# Patient Record
Sex: Male | Born: 1965 | Race: Black or African American | Hispanic: No | Marital: Single | Smoking: Current some day smoker
Health system: Southern US, Community
[De-identification: ages and names within clinical notes are randomized; demographics above are authoritative.]

## PROBLEM LIST (undated history)

## (undated) DIAGNOSIS — M5432 Sciatica, left side: Secondary | ICD-10-CM

## (undated) DIAGNOSIS — R4589 Other symptoms and signs involving emotional state: Secondary | ICD-10-CM

## (undated) DIAGNOSIS — R4689 Other symptoms and signs involving appearance and behavior: Secondary | ICD-10-CM

## (undated) DIAGNOSIS — F1010 Alcohol abuse, uncomplicated: Secondary | ICD-10-CM

## (undated) DIAGNOSIS — J45909 Unspecified asthma, uncomplicated: Secondary | ICD-10-CM

## (undated) DIAGNOSIS — G894 Chronic pain syndrome: Secondary | ICD-10-CM

## (undated) DIAGNOSIS — I10 Essential (primary) hypertension: Secondary | ICD-10-CM

## (undated) HISTORY — DX: Chronic pain syndrome: G89.4

## (undated) HISTORY — PX: SCIATIC NERVE EXPLORATION: SHX5373

## (undated) HISTORY — DX: Sciatica, left side: M54.32

---

## 1990-09-22 HISTORY — PX: OTHER SURGICAL HISTORY: SHX169

## 1998-05-22 ENCOUNTER — Emergency Department (HOSPITAL_COMMUNITY): Admission: EM | Admit: 1998-05-22 | Discharge: 1998-05-22 | Payer: Self-pay | Admitting: Emergency Medicine

## 1998-09-22 HISTORY — PX: OTHER SURGICAL HISTORY: SHX169

## 1999-07-18 ENCOUNTER — Emergency Department (HOSPITAL_COMMUNITY): Admission: EM | Admit: 1999-07-18 | Discharge: 1999-07-18 | Payer: Self-pay | Admitting: Emergency Medicine

## 2007-08-30 ENCOUNTER — Emergency Department (HOSPITAL_COMMUNITY): Admission: EM | Admit: 2007-08-30 | Discharge: 2007-08-30 | Payer: Self-pay | Admitting: Emergency Medicine

## 2008-05-17 ENCOUNTER — Encounter: Admission: RE | Admit: 2008-05-17 | Discharge: 2008-06-16 | Payer: Self-pay | Admitting: Family Medicine

## 2008-09-06 HISTORY — PX: OTHER SURGICAL HISTORY: SHX169

## 2009-02-01 ENCOUNTER — Emergency Department (HOSPITAL_COMMUNITY): Admission: EM | Admit: 2009-02-01 | Discharge: 2009-02-01 | Payer: Self-pay | Admitting: Emergency Medicine

## 2009-09-22 ENCOUNTER — Emergency Department (HOSPITAL_COMMUNITY): Admission: EM | Admit: 2009-09-22 | Discharge: 2009-09-22 | Payer: Self-pay | Admitting: Emergency Medicine

## 2010-08-21 ENCOUNTER — Emergency Department (HOSPITAL_COMMUNITY): Admission: EM | Admit: 2010-08-21 | Discharge: 2010-08-21 | Payer: Self-pay | Admitting: Emergency Medicine

## 2010-12-08 LAB — BASIC METABOLIC PANEL
BUN: 15 mg/dL (ref 6–23)
Chloride: 101 mEq/L (ref 96–112)
Creatinine, Ser: 1.25 mg/dL (ref 0.4–1.5)
GFR calc non Af Amer: >60 mL/min (ref >60)
Glucose, Bld: 92 mg/dL (ref 70–99)
Potassium: 3.7 mEq/L (ref 3.5–5.1)

## 2010-12-08 LAB — CBC
HCT: 44 % (ref 39.0–52.0)
MCV: 90.7 fL (ref 78.0–100.0)
Platelets: 189 10*3/uL (ref 150–400)
RDW: 14.3 % (ref 11.5–15.5)
WBC: 3.9 10*3/uL — ABNORMAL LOW (ref 4.0–10.5)

## 2010-12-08 LAB — DIFFERENTIAL
Basophils Absolute: 0 10*3/uL (ref 0.0–0.1)
Eosinophils Absolute: 0.1 10*3/uL (ref 0.0–0.7)
Eosinophils Relative: 2 % (ref 0–5)
Lymphs Abs: 0.7 10*3/uL (ref 0.7–4.0)
Neutrophils Relative %: 64 % (ref 43–77)

## 2012-01-29 ENCOUNTER — Ambulatory Visit (INDEPENDENT_AMBULATORY_CARE_PROVIDER_SITE_OTHER): Payer: Medicaid Other | Admitting: Family Medicine

## 2012-01-29 ENCOUNTER — Encounter: Payer: Self-pay | Admitting: Family Medicine

## 2012-01-29 VITALS — BP 137/105 | HR 87 | Ht 75.39 in | Wt 230.0 lb

## 2012-01-29 DIAGNOSIS — R05 Cough: Secondary | ICD-10-CM

## 2012-01-29 DIAGNOSIS — K219 Gastro-esophageal reflux disease without esophagitis: Secondary | ICD-10-CM

## 2012-01-29 DIAGNOSIS — G479 Sleep disorder, unspecified: Secondary | ICD-10-CM | POA: Insufficient documentation

## 2012-01-29 DIAGNOSIS — G8921 Chronic pain due to trauma: Secondary | ICD-10-CM

## 2012-01-29 DIAGNOSIS — B009 Herpesviral infection, unspecified: Secondary | ICD-10-CM

## 2012-01-29 DIAGNOSIS — G894 Chronic pain syndrome: Secondary | ICD-10-CM

## 2012-01-29 MED ORDER — ZOLPIDEM TARTRATE 10 MG PO TABS: 10.0000 mg | ORAL_TABLET | Freq: Every evening | ORAL | Status: DC | PRN

## 2012-01-29 MED ORDER — GABAPENTIN 100 MG PO CAPS: 100.0000 mg | ORAL_CAPSULE | Freq: Three times a day (TID) | ORAL | Status: DC

## 2012-01-29 MED ORDER — OMEPRAZOLE 20 MG PO CPDR: 20.0000 mg | DELAYED_RELEASE_CAPSULE | Freq: Every day | ORAL | Status: DC

## 2012-01-29 NOTE — Patient Instructions (Signed)
Dear [name],   Thank you for coming to clinic today. Please read below regarding the issues that we discussed.   1. Pain - We will start with neurontin 300 mg a day (3 pills). You can go up every couple days, but do not take more than 900 mg a day until you see me again.   2. Sleep - Let's try ambien since it has worked in the past.   3. Cough - I think this can be related to acid reflux. We will try medication called omeprazole to reduce the acid and cough.   Please follow up in clinic in 2 weeks . Please call earlier if you have any questions or concerns.   Sincerely,   Dr. Clinton Sawyer

## 2012-02-14 ENCOUNTER — Ambulatory Visit (INDEPENDENT_AMBULATORY_CARE_PROVIDER_SITE_OTHER): Payer: 59 | Admitting: Family Medicine

## 2012-02-14 VITALS — BP 164/109 | HR 72 | Temp 98.0°F | Resp 16 | Ht 70.0 in | Wt 229.6 lb

## 2012-02-14 DIAGNOSIS — R05 Cough: Secondary | ICD-10-CM

## 2012-02-14 DIAGNOSIS — G894 Chronic pain syndrome: Secondary | ICD-10-CM

## 2012-02-14 DIAGNOSIS — G8921 Chronic pain due to trauma: Secondary | ICD-10-CM

## 2012-02-14 DIAGNOSIS — M5432 Sciatica, left side: Secondary | ICD-10-CM

## 2012-02-14 DIAGNOSIS — I10 Essential (primary) hypertension: Secondary | ICD-10-CM

## 2012-02-14 DIAGNOSIS — R059 Cough, unspecified: Secondary | ICD-10-CM

## 2012-02-14 HISTORY — DX: Chronic pain syndrome: G89.4

## 2012-02-14 HISTORY — DX: Sciatica, left side: M54.32

## 2012-02-14 LAB — COMPREHENSIVE METABOLIC PANEL
ALT: 26 U/L (ref 0–53)
BUN: 15 mg/dL (ref 6–23)
CO2: 26 mEq/L (ref 19–32)
Calcium: 9.3 mg/dL (ref 8.4–10.5)
Chloride: 106 mEq/L (ref 96–112)
Creat: 0.92 mg/dL (ref 0.50–1.35)
Glucose, Bld: 92 mg/dL (ref 70–99)
Total Bilirubin: 0.7 mg/dL (ref 0.3–1.2)

## 2012-02-14 LAB — POCT CBC
HCT, POC: 42.5 % — AB (ref 43.5–53.7)
Hemoglobin: 14.2 g/dL (ref 14.1–18.1)
Lymph, poc: 1.6 (ref 0.6–3.4)
MCH, POC: 30.7 pg (ref 27–31.2)
MCHC: 33.4 g/dL (ref 31.8–35.4)
MCV: 91 fL (ref 80–97)
RBC: 4.63 M/uL — AB (ref 4.69–6.13)
WBC: 3.9 10*3/uL — AB (ref 4.6–10.2)

## 2012-02-14 LAB — GLUCOSE, POCT (MANUAL RESULT ENTRY): POC Glucose: 83 mg/dl (ref 70–99)

## 2012-02-14 MED ORDER — GABAPENTIN 100 MG PO CAPS: ORAL_CAPSULE | ORAL | Status: DC

## 2012-02-14 MED ORDER — BECLOMETHASONE DIPROPIONATE 40 MCG/ACT IN AERS: 2.0000 | INHALATION_SPRAY | Freq: Two times a day (BID) | RESPIRATORY_TRACT | Status: DC

## 2012-02-14 MED ORDER — PREDNISONE 20 MG PO TABS: ORAL_TABLET | ORAL | Status: AC

## 2012-02-14 MED ORDER — ALBUTEROL SULFATE (2.5 MG/3ML) 0.083% IN NEBU
2.5000 mg | INHALATION_SOLUTION | Freq: Once | RESPIRATORY_TRACT | Status: AC
  Administered 2012-02-14: 2.5 mg via RESPIRATORY_TRACT

## 2012-02-14 MED ORDER — LOSARTAN POTASSIUM-HCTZ 50-12.5 MG PO TABS: ORAL_TABLET | ORAL | Status: DC

## 2012-02-14 MED ORDER — IPRATROPIUM BROMIDE 0.02 % IN SOLN
0.5000 mg | Freq: Once | RESPIRATORY_TRACT | Status: AC
  Administered 2012-02-14: 0.5 mg via RESPIRATORY_TRACT

## 2012-02-14 MED ORDER — ALBUTEROL SULFATE HFA 108 (90 BASE) MCG/ACT IN AERS: 2.0000 | INHALATION_SPRAY | Freq: Four times a day (QID) | RESPIRATORY_TRACT | Status: DC | PRN

## 2012-02-14 NOTE — Patient Instructions (Signed)
Take albuterol inhaler 2 puffs every 4-6 hours for wheezing and shortness of breath.  Use only when necessary  Take QVAR 40  Two puffs twice daily on a long term basis to prevent asthma attacks  Increase Neurontin (gabepentin) to 300 mg twice daily for 1 week, then 3 times daily.
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
## 2012-02-16 ENCOUNTER — Encounter: Payer: Self-pay | Admitting: *Deleted

## 2012-02-19 ENCOUNTER — Encounter: Payer: Self-pay | Admitting: Family Medicine

## 2012-02-19 DIAGNOSIS — B009 Herpesviral infection, unspecified: Secondary | ICD-10-CM | POA: Insufficient documentation

## 2012-02-19 NOTE — Assessment & Plan Note (Signed)
I believe that this may be GERD based on hx so start trial of PPI.

## 2012-02-19 NOTE — Assessment & Plan Note (Signed)
Given Rx for [employer]

## 2012-02-19 NOTE — Assessment & Plan Note (Signed)
Since pain is neuropathic and has taken neurontin in the past, we will try this again; starting at 100 mg TID and titrating up; f/u 4 weeks
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
## 2013-01-28 ENCOUNTER — Emergency Department (HOSPITAL_COMMUNITY)
Admission: EM | Admit: 2013-01-28 | Discharge: 2013-01-28 | Disposition: A | Discharge status: Other Acute Inpatient Hospital | Payer: Medicare Other | Source: Home / Self Care | Attending: Emergency Medicine | Admitting: Emergency Medicine

## 2013-01-28 ENCOUNTER — Inpatient Hospital Stay (HOSPITAL_COMMUNITY)
Admission: AD | Admit: 2013-01-28 | Discharge: 2013-01-31 | DRG: 897 | Disposition: A | Discharge status: Home / Self Care | Payer: Medicare Other | Source: Intra-hospital | Attending: Psychiatry | Admitting: Psychiatry

## 2013-01-28 ENCOUNTER — Encounter (HOSPITAL_COMMUNITY): Payer: Self-pay

## 2013-01-28 ENCOUNTER — Encounter (HOSPITAL_COMMUNITY): Payer: Self-pay | Admitting: Emergency Medicine

## 2013-01-28 DIAGNOSIS — G894 Chronic pain syndrome: Secondary | ICD-10-CM | POA: Diagnosis present

## 2013-01-28 DIAGNOSIS — F1020 Alcohol dependence, uncomplicated: Principal | ICD-10-CM | POA: Diagnosis present

## 2013-01-28 DIAGNOSIS — F1010 Alcohol abuse, uncomplicated: Secondary | ICD-10-CM

## 2013-01-28 LAB — COMPREHENSIVE METABOLIC PANEL
ALT: 36 U/L (ref 0–53)
AST: 35 U/L (ref 0–37)
Alkaline Phosphatase: 57 U/L (ref 39–117)
CO2: 23 mEq/L (ref 19–32)
Calcium: 9.4 mg/dL (ref 8.4–10.5)
GFR calc non Af Amer: 77 mL/min — ABNORMAL LOW (ref >90)
Potassium: 3.8 mEq/L (ref 3.5–5.1)
Sodium: 140 mEq/L (ref 135–145)

## 2013-01-28 LAB — CBC
MCH: 30.9 pg (ref 26.0–34.0)
Platelets: 260 10*3/uL (ref 150–400)
RBC: 5.08 MIL/uL (ref 4.22–5.81)
WBC: 3.9 10*3/uL — ABNORMAL LOW (ref 4.0–10.5)

## 2013-01-28 LAB — ACETAMINOPHEN LEVEL: Acetaminophen (Tylenol), Serum: <15 ug/mL (ref 10–30)

## 2013-01-28 MED ORDER — THIAMINE HCL 100 MG/ML IJ SOLN: 100.0000 mg | Freq: Every day | INTRAMUSCULAR | Status: DC

## 2013-01-28 MED ORDER — IBUPROFEN 200 MG PO TABS: 600.0000 mg | ORAL_TABLET | Freq: Three times a day (TID) | ORAL | Status: DC | PRN

## 2013-01-28 MED ORDER — ALUM & MAG HYDROXIDE-SIMETH 200-200-20 MG/5ML PO SUSP: 30.0000 mL | ORAL | Status: DC | PRN

## 2013-01-28 MED ORDER — LORAZEPAM 1 MG PO TABS: 0.0000 mg | ORAL_TABLET | Freq: Two times a day (BID) | ORAL | Status: DC

## 2013-01-28 MED ORDER — ONDANSETRON HCL 4 MG PO TABS: 4.0000 mg | ORAL_TABLET | Freq: Three times a day (TID) | ORAL | Status: DC | PRN

## 2013-01-28 MED ORDER — VITAMIN B-1 100 MG PO TABS
100.0000 mg | ORAL_TABLET | Freq: Every day | ORAL | Status: DC
  Administered 2013-01-28: 100 mg via ORAL
  Filled 2013-01-28: qty 1

## 2013-01-28 MED ORDER — FOLIC ACID 1 MG PO TABS
1.0000 mg | ORAL_TABLET | Freq: Every day | ORAL | Status: DC
  Administered 2013-01-28: 1 mg via ORAL
  Filled 2013-01-28: qty 1

## 2013-01-28 MED ORDER — ZOLPIDEM TARTRATE 5 MG PO TABS: 5.0000 mg | ORAL_TABLET | Freq: Every evening | ORAL | Status: DC | PRN

## 2013-01-28 MED ORDER — LORAZEPAM 1 MG PO TABS: 1.0000 mg | ORAL_TABLET | Freq: Three times a day (TID) | ORAL | Status: DC | PRN

## 2013-01-28 MED ORDER — ADULT MULTIVITAMIN W/MINERALS CH
1.0000 | ORAL_TABLET | Freq: Every day | ORAL | Status: DC
  Administered 2013-01-28: 1 via ORAL
  Filled 2013-01-28: qty 1

## 2013-01-28 MED ORDER — LORAZEPAM 1 MG PO TABS: 0.0000 mg | ORAL_TABLET | Freq: Four times a day (QID) | ORAL | Status: DC

## 2013-01-28 MED ORDER — NICOTINE 21 MG/24HR TD PT24
21.0000 mg | MEDICATED_PATCH | Freq: Every day | TRANSDERMAL | Status: DC
  Administered 2013-01-28: 21 mg via TRANSDERMAL
  Filled 2013-01-28: qty 1

## 2013-01-28 NOTE — ED Notes (Signed)
Patient advised that "i have been drinking since I was 6 or 7.  I have to get detoxed".  When patient was asked why detoxing, he responded "loss of a relationship with my daughter".
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
## 2013-01-28 NOTE — ED Notes (Signed)
Pt here requesting detox from ETOH; pt admits to cocaine use also; pt sts drinks fifth plus a 6 pack a day and last drank today; pt denies SI/HI
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
## 2013-01-28 NOTE — ED Notes (Signed)
Patient given paper scrubs. Security called for wanding.

## 2013-01-29 ENCOUNTER — Encounter (HOSPITAL_COMMUNITY): Payer: Self-pay | Admitting: Psychiatry

## 2013-01-29 DIAGNOSIS — F1010 Alcohol abuse, uncomplicated: Secondary | ICD-10-CM

## 2013-01-29 DIAGNOSIS — F1994 Other psychoactive substance use, unspecified with psychoactive substance-induced mood disorder: Secondary | ICD-10-CM

## 2013-01-29 DIAGNOSIS — F1020 Alcohol dependence, uncomplicated: Secondary | ICD-10-CM | POA: Diagnosis present

## 2013-01-29 DIAGNOSIS — F1910 Other psychoactive substance abuse, uncomplicated: Secondary | ICD-10-CM

## 2013-01-29 MED ORDER — THIAMINE HCL 100 MG/ML IJ SOLN: 100.0000 mg | Freq: Once | INTRAMUSCULAR | Status: DC

## 2013-01-29 MED ORDER — ADULT MULTIVITAMIN W/MINERALS CH
1.0000 | ORAL_TABLET | Freq: Every day | ORAL | Status: DC
  Administered 2013-01-29 – 2013-01-31 (×3): 1 via ORAL
  Filled 2013-01-29 (×5): qty 1

## 2013-01-29 MED ORDER — CHLORDIAZEPOXIDE HCL 25 MG PO CAPS: 25.0000 mg | ORAL_CAPSULE | Freq: Every day | ORAL | Status: DC

## 2013-01-29 MED ORDER — METHOCARBAMOL 500 MG PO TABS
500.0000 mg | ORAL_TABLET | Freq: Three times a day (TID) | ORAL | Status: DC | PRN
  Administered 2013-01-29 – 2013-01-30 (×3): 500 mg via ORAL
  Filled 2013-01-29 (×3): qty 1

## 2013-01-29 MED ORDER — CHLORDIAZEPOXIDE HCL 25 MG PO CAPS
25.0000 mg | ORAL_CAPSULE | ORAL | Status: DC
  Administered 2013-01-31: 25 mg via ORAL
  Filled 2013-01-29: qty 1

## 2013-01-29 MED ORDER — CHLORDIAZEPOXIDE HCL 25 MG PO CAPS
25.0000 mg | ORAL_CAPSULE | Freq: Three times a day (TID) | ORAL | Status: AC
  Administered 2013-01-30 (×3): 25 mg via ORAL
  Filled 2013-01-29 (×3): qty 1

## 2013-01-29 MED ORDER — ONDANSETRON 4 MG PO TBDP: 4.0000 mg | ORAL_TABLET | Freq: Four times a day (QID) | ORAL | Status: DC | PRN

## 2013-01-29 MED ORDER — NABUMETONE 500 MG PO TABS
500.0000 mg | ORAL_TABLET | Freq: Two times a day (BID) | ORAL | Status: DC | PRN
  Administered 2013-01-29 – 2013-01-30 (×4): 500 mg via ORAL
  Filled 2013-01-29 (×4): qty 1

## 2013-01-29 MED ORDER — VITAMIN B-1 100 MG PO TABS
100.0000 mg | ORAL_TABLET | Freq: Every day | ORAL | Status: DC
  Administered 2013-01-29 – 2013-01-31 (×3): 100 mg via ORAL
  Filled 2013-01-29 (×4): qty 1

## 2013-01-29 MED ORDER — CHLORDIAZEPOXIDE HCL 25 MG PO CAPS
25.0000 mg | ORAL_CAPSULE | Freq: Once | ORAL | Status: AC
  Administered 2013-01-29: 25 mg via ORAL
  Filled 2013-01-29: qty 1

## 2013-01-29 MED ORDER — LOPERAMIDE HCL 2 MG PO CAPS: 2.0000 mg | ORAL_CAPSULE | ORAL | Status: DC | PRN

## 2013-01-29 MED ORDER — NICOTINE 21 MG/24HR TD PT24
21.0000 mg | MEDICATED_PATCH | Freq: Every day | TRANSDERMAL | Status: DC
  Administered 2013-01-29 – 2013-01-31 (×3): 21 mg via TRANSDERMAL
  Filled 2013-01-29: qty 14
  Filled 2013-01-29 (×4): qty 1

## 2013-01-29 MED ORDER — CHLORDIAZEPOXIDE HCL 25 MG PO CAPS
25.0000 mg | ORAL_CAPSULE | Freq: Four times a day (QID) | ORAL | Status: AC
  Administered 2013-01-29 (×4): 25 mg via ORAL
  Filled 2013-01-29 (×4): qty 1

## 2013-01-29 MED ORDER — TRAZODONE HCL 50 MG PO TABS
50.0000 mg | ORAL_TABLET | Freq: Once | ORAL | Status: AC
  Administered 2013-01-29: 50 mg via ORAL
  Filled 2013-01-29 (×2): qty 1

## 2013-01-29 MED ORDER — HYDROXYZINE HCL 25 MG PO TABS
25.0000 mg | ORAL_TABLET | Freq: Four times a day (QID) | ORAL | Status: DC | PRN
  Administered 2013-01-29 – 2013-01-30 (×2): 25 mg via ORAL
  Filled 2013-01-29 (×2): qty 1

## 2013-01-29 MED ORDER — CHLORDIAZEPOXIDE HCL 25 MG PO CAPS: 25.0000 mg | ORAL_CAPSULE | Freq: Four times a day (QID) | ORAL | Status: DC | PRN

## 2013-01-29 NOTE — ED Provider Notes (Signed)
Medical screening examination/treatment/procedure(s) were performed by non-physician practitioner and as supervising physician I was immediately available for consultation/collaboration.   Helaine Yackel III, MD 01/29/13 1301 

## 2013-01-29 NOTE — [facility] Suicide Risk Assessment (Signed)
Suicide Risk Assessment  Admission Assessment     Information obtained from: Patient Demographic factors:   Current Mental Status per Physician Patient denies suicidal or homicidal ideation, hallucinations, illusions, or delusions. Patient engages with good eye contact, is able to focus adequately in a one to one setting, and has clear goal directed thoughts. Patient speaks with a natural conversational volume, rate, and tone. Anxiety was reported at 0 on a scale of 1 the least and 10 the most. Depression was reported at 0 on the same scale. Patient is oriented times 4, recent and remote memory intact. Judgement: limited by his mental illness and his addictive thinking. Insight: limited by his mental illness and his addictive thinking.  Loss Factors: Decline in physical health Historical Factors: NA Risk Reduction Factors: Responsible for children under 64 years of age;Sense of responsibility to family;Positive social support;Positive therapeutic relationship  CLINICAL FACTORS:   Alcohol/Substance Abuse/Dependencies  COGNITIVE FEATURES THAT CONTRIBUTE TO RISK:  Closed-mindedness Thought constriction (tunnel vision)    SUICIDE RISK:   Minimal: No identifiable suicidal ideation.  Patients presenting with no risk factors but with morbid ruminations; may be classified as minimal risk based on the severity of the depressive symptoms  PLAN OF CARE: 1 Admit for crisis management and stabilization.  Estimate length of stay is 3 to 5 days.  Get started in 12 Step programing.  2. Medication management to reduce current symptoms to base line and improve the patient's overall level of functioning.  3. Treat health problems as indicated:  4. Develop treatment plan to decrease risk of relapse upon discharge and the need for readmission.  5. Psycho-social education regarding relapse prevention and self-care.  6. Review and reinstate any pertinent home medication for other health issues  where appropriate.  7. Call for Consult with [facility] for additional specialty patient services as needed.  I certify that inpatient services furnished can reasonably be expected to improve the patient's condition.  Orson Aloe, MD, [facility] 01/29/2013 2:45 PM

## 2013-01-29 NOTE — Progress Notes (Signed)
Adult Psychoeducational Group Note  Date:  01/29/2013 Time:  0900  Group Topic/Focus:  Recovery Goals:   The focus of this group is to identify appropriate goals for recovery and establish a plan to achieve them.  Participation Level:  Active  Participation Quality:  Appropriate and Attentive  Affect:  Appropriate and Flat  Cognitive:  Alert, Appropriate and Oriented  Insight: Improving  Engagement in Group:  Engaged  Modes of Intervention:  Discussion, Education and Problem-solving  Additional Comments:  Pt is attentive to group discussion and expressed his needs to staff.  Lorin Hauck Shari Prows 01/29/2013, 10:26 AM
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
## 2013-01-29 NOTE — Tx Team (Signed)
Initial Interdisciplinary Treatment Plan  PATIENT STRENGTHS: (choose at least two) Ability for insight Average or above average intelligence Motivation for treatment/growth Supportive family/friends  PATIENT STRESSORS: Health problems Legal issue Substance abuse   PROBLEM LIST: Problem List/Patient Goals Date to be addressed Date deferred Reason deferred Estimated date of resolution  ETOH detox 01/29/2013                                                      DISCHARGE CRITERIA:  Ability to meet basic life and health needs Adequate post-discharge living arrangements Motivation to continue treatment in a less acute level of care Verbal commitment to aftercare and medication compliance Withdrawal symptoms are absent or subacute and managed without 24-hour nursing intervention  PRELIMINARY DISCHARGE PLAN: Attend PHP/IOP Outpatient therapy Return to previous living arrangement  PATIENT/FAMIILY INVOLVEMENT: This treatment plan has been presented to and reviewed with the patient, [name], and/or family member.  The patient and family have been given the opportunity to ask questions and make suggestions.  JEHU-APPIAH, Imojean Yoshino K 01/29/2013, 12:17 AM

## 2013-01-29 NOTE — H&P (Signed)
,  Medical/psychiatric examination/treatment/procedure(s) were performed by non-physician practitioner and as supervising physician I was immediately available for consultation/collaboration. I agree with this note. I certify that inpatient services furnished can reasonably be expected to improve the patient's condition. Orson Aloe, MD, [facility]

## 2013-01-29 NOTE — Progress Notes (Signed)
D   Pt has been appropriate and pleasant he attended and participated in groups   He said his sleep and appetite was good even though he used a sleep medication    He reports normal energy level and virtually no signs and symptoms of withdrawal   He has good insight into his addiction and ways to prevent relapse A   Verbal support given  Medications administered and effectiveness monitored   Q 15 min checks R   Pt safe at present

## 2013-01-29 NOTE — Progress Notes (Signed)
Psychoeducational Group Note  Date:  01/29/2013 Time:  0945 am  Group Topic/Focus:  Identifying Needs:   The focus of this group is to help patients identify their personal needs that have been historically problematic and identify healthy behaviors to address their needs.  Participation Level:  Active  Participation Quality:  Appropriate, Attentive, Sharing and Supportive  Affect:  Appropriate  Cognitive:  Alert, Appropriate and Oriented  Insight:  Developing/Improving  Engagement in Group:  Developing/Improving  Additional Comments:    Andrena Mews 01/29/2013,3:12 PM
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
## 2013-01-29 NOTE — [facility] Group Notes (Signed)
[facility] Group Notes:  (Clinical Social Work)  01/29/2013     10-11AM  Summary of Progress/Problems:   The main focus of today's process group was for the patient to identify ways in which they have in the past sabotaged their own recovery. Motivational Interviewing was utilized to ask the group members what they get out of their substance use, and what reasons they may have for wanting to change.  Scale 1-10 (low to high) was used for patient to determine their current level of motivation.  The patient expressed that he was clean for 7 years on his own, then he moved to [place] and his father died.  He has self-sabotaged with alcohol, says alcohol is a trigger for some people to do drugs but for him it is a trigger to do more alcohol.  He will drink until he passes out.  Over the last month prior to admission he mostly stayed gone from home, on the street drinking, eating only rarely.  He called his behavior/thinking "retarded" and CSW asked him if it was more along the lines of "challenged" to which he agreed.  He wants to change because he is tired of disappointing people and himself, saying "I'm not dependable."  His motivation to change is 10 out of 10.  Type of Therapy:  Group Therapy - Process   Participation Level:  Active  Participation Quality:  Attentive and Sharing  Affect:  Blunted  Cognitive:  Oriented  Insight:  Engaged  Engagement in Therapy:  Engaged  Modes of Intervention:  Education, Teacher, English as a foreign language, Motivational Interviewing  Ambrose Mantle, LCSW 01/29/2013, 11:15 AM

## 2013-01-30 NOTE — Progress Notes (Signed)
[facility] Group Notes:  (Nursing/MHT/Case Management/Adjunct)  Date: 01/29/2013 Time: 2100  Type of Therapy:  wrap up group  Participation Level:  Active  Participation Quality:  Appropriate and Attentive  Affect:  Appropriate  Cognitive:  Alert and Appropriate  Insight:  Appropriate and Good  Engagement in Group:  Engaged  Modes of Intervention:  Clarification, Education and Support  Summary of Progress/Problems: Wants to thrive without alcohol.  Shelah Lewandowsky 01/30/2013, 1:01 AM
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
## 2013-01-30 NOTE — Progress Notes (Signed)
[facility] Group Notes:  (Nursing/MHT/Case Management/Adjunct)  Date:  01/30/2013  Time:  2100  Type of Therapy:  wrap up group  Participation Level:  Active  Participation Quality:  Appropriate, Attentive, Sharing and Supportive  Affect:  Appropriate  Cognitive:  Alert and Appropriate  Insight:  Good  Engagement in Group:  Engaged and Supportive  Modes of Intervention:  Clarification, Education and Support  Summary of Progress/Problems:  [name] 01/30/2013, 11:42 PM
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
## 2013-01-30 NOTE — Progress Notes (Signed)
Psychoeducational Group Note  Date:  01/30/2013 Time:  0945 am  Group Topic/Focus:  Making Healthy Choices:   The focus of this group is to help patients identify negative/unhealthy choices they were using prior to admission and identify positive/healthier coping strategies to replace them upon discharge.  Participation Level:  Active  Participation Quality:  Appropriate, Sharing and Supportive  Affect:  Appropriate  Cognitive:  Appropriate  Insight:  Developing/Improving  Engagement in Group:  Developing/Improving  Additional Comments:    Andrena Mews 01/30/2013, 10:29 AM

## 2013-01-30 NOTE — Progress Notes (Signed)
D   Pt has been appropriate and pleasant he attended and participated in groups   He said his sleep and appetite was good even though he used a sleep medication    He reports normal energy level and virtually no signs and symptoms of withdrawal   He has good insight into his addiction and ways to prevent relapse A   Verbal support given  Medications administered and effectiveness monitored   Q 15 min checks R   Pt safe at present 
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
## 2013-01-31 DIAGNOSIS — F1020 Alcohol dependence, uncomplicated: Principal | ICD-10-CM

## 2013-01-31 MED ORDER — NICOTINE 21 MG/24HR TD PT24: 1.0000 | MEDICATED_PATCH | Freq: Every day | TRANSDERMAL | Status: DC

## 2013-01-31 NOTE — Progress Notes (Signed)
NUTRITION ASSESSMENT  Pt identified as at risk on the Malnutrition Screen Tool  INTERVENTION: 1. Educated patient on the importance of nutrition and encouraged intake of food and beverages. 2. Discussed weight goals.   NUTRITION DIAGNOSIS: Unintentional weight loss related to sub-optimal intake as evidenced by pt report.   Goal: Pt to meet >/= 90% of their estimated nutrition needs.  Monitor:  PO intake  Assessment:   Patient reports UBW of 240-245 lbs.  Decreased to 229 lbs and new he needed to get help.  Drinking binge since Friday and was not eating.  Very good intake currently and usually PTA.  47 y.o. male  Height: Ht Readings from Last 1 Encounters:  01/29/13 6\' 3"  (1.905 m)    Weight: Wt Readings from Last 1 Encounters:  01/29/13 229 lb (103.874 kg)    Weight Hx: Wt Readings from Last 10 Encounters:  01/29/13 229 lb (103.874 kg)  02/14/12 229 lb 9.6 oz (104.146 kg)  01/29/12 230 lb (104.327 kg)    BMI:  Body mass index is 28.62 kg/(m^2). Pt meets criteria for overweight based on current BMI.  Estimated Nutritional Needs: Kcal: 25-30 kcal/kg Protein: > 1 gram protein/kg Fluid: 1 ml/kcal  Diet Order:   Pt is also offered choice of unit snacks mid-morning and mid-afternoon.  Pt is eating as desired.   Lab results and medications reviewed.   Oran Rein, RD, LDN Clinical Inpatient Dietitian Pager:  [phone] Weekend and after hours pager:  [phone]
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
## 2013-01-31 NOTE — Progress Notes (Signed)
[facility] Adult Case Management Discharge Plan :  Will you be returning to the same living situation after discharge: Yes,  returning home until treatment At discharge, do you have transportation home?:Yes,  access to transportation  Do you have the ability to pay for your medications:Yes,  access to meds  Release of information consent forms completed and in the chart;  Patient's signature needed at discharge.  Patient to Follow up at: Follow-up Information   Follow up with [facility]  On 02/16/2013. (Admission date for treatment on this date, per Tianna.  Arrive promptly at 8:00 am)    Contact information:   [address] [place], [place] [address] Phone: [phone] Fax: [phone]       Patient denies SI/HI:   Yes,  denies SI/HI    Safety Planning and Suicide Prevention discussed:  Yes,  discussed with pt (see suicide prevention note)  Horton, Salome Arnt 01/31/2013, 3:12 PM

## 2013-01-31 NOTE — Progress Notes (Signed)
I agree with this note. I certify that inpatient services furnished can reasonably be expected to improve the patient's condition. Kumiko Fishman, MD, MSPH  

## 2013-01-31 NOTE — Progress Notes (Signed)
Patient ID: [name], male   DOB: [birth year], 47 y.o.   MRN: [id number] D)  Has been pleasant and cooperative this evening, attended group, watched tv earlier, states has had a good day overall.  Came to med window for hs meds, and was going to go to bed.  Denies thoughts of self harm, feels detox is going OK, no c/o tonight of w/d. A)  Will continue to monitor for safety, continue POC. R)  safety maintained.

## 2013-01-31 NOTE — Tx Team (Signed)
Interdisciplinary Treatment Plan Update   Date Reviewed: 01/31/2013  Time Reviewed: 9:45 AM  Progress in Treatment:  Attending groups: Yes  Participating in groups: Yes Taking medication as prescribed: Yes  Tolerating medication: Yes  Family/Significant other contact made: Pt refused Patient understands diagnosis: Yes Discussing patient identified problems/goals with staff: Yes  Medical problems stabilized or resolved: Yes  Denies suicidal/homicidal ideation: Yes Patient has not harmed self or others: Yes    New Problems/Goals identified:None currently   Discharge Plan or Barriers:  Aftercare in place.  Additional Comments: Stable to d/c  Reasons for Continued Hospitalization: N/A, stable to d/c  Estimated Length of Stay: D/C today  For review of initial/current patient goals, please see plan of care.  Attendees: Patient:   5/12/20141:40 PM  Family:   5/12/20141:40 PM  Physician: Dr. Dub Mikes   5/12/20141:40 PM  Nursing:  Liborio Nixon, RN 5/12/20141:40 PM  Clinical Social Worker:  Reyes Ivan, [place] 5/12/20141:40 PM  Other: Robbie Louis, RN 5/12/20141:40 PM  Other:   5/12/20141:40 PM  Other:   5/12/20141:40 PM  Other:   5/12/20141:40 PM  Other:  5/12/20141:40 PM  Other:  5/12/20141:40 PM  Other:  5/12/20141:40 PM  Other:  5/12/20141:40 PM  Other:  5/12/20141:40 PM  Other:  5/12/20141:40 PM  Other:   5/12/20141:40 PM   Scribe for Treatment Team:   Carmina Miller, 01/31/2013, 1:40 PM

## 2013-01-31 NOTE — Progress Notes (Signed)
Adult Psychoeducational Group Note  Date:  01/31/2013 Time:  11:41 AM  Group Topic/Focus:  Self Care:   The focus of this group is to help patients understand the importance of self-care in order to improve or restore emotional, physical, spiritual, interpersonal, and financial health.  Participation Level:  Active  Participation Quality:  Appropriate and Attentive  Affect:  Appropriate  Cognitive:  Alert and Appropriate  Insight: Appropriate and Good  Engagement in Group:  Engaged  Modes of Intervention:  Activity and Discussion  Additional Comments:  Pt. attended group about physical, psychological, emotional, spiritual and relationship self care. Pt. identified areas of strength as well as ways to improve areas of growth.   Ruta Hinds [facility] 01/31/2013, 11:41 AM
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
## 2013-01-31 NOTE — B[facility] Group Notes (Signed)
[facility] LCSW Aftercare Discharge Planning Group Note   01/31/2013 8:45 AM  Participation Quality:  Alert and Appropriate   Mood/Affect:  Appropriate  Depression Rating:  3  Anxiety Rating:  3  Thoughts of Suicide: Pt denies SI/HI   Will you contract for safety?   NA  Current AVH:  No  Plan for Discharge/Comments:  Pt attended discharge planning group and actively participated in group.  CSW provided pt with today's workbook.  Pt states that he has a court date this morning and needs a letter faxed to his attorney this morning.  CSW faxed letter this morning.  Pt states that he wants to go to [facility] in [place] and is working on getting in there himself.  CSW provided pt with the number to [facility] and will monitor this referral.  No further needs voiced by pt at this time.    Transportation Means: Pt reports access to transportation  Supports: None reported.  Reyes Ivan, LCSWA 01/31/2013  9:20 AM

## 2013-01-31 NOTE — [facility] Suicide Risk Assessment (Signed)
[facility] Adult Inpatient Family/Significant Other Suicide Prevention Education  Suicide Prevention Education:   Patient Refusal for Family/Significant Other Suicide Prevention Education: The patient has refused to provide written consent for family/significant other to be provided Family/Significant Other Suicide Prevention Education during admission and/or prior to discharge.  Physician notified.  CSW provided suicide prevention information with patient.    The suicide prevention education provided includes the following:  Suicide risk factors  Suicide prevention and interventions  National Suicide Hotline telephone number  [facility] assessment telephone number  [facility] Emergency Assistance 911  [facility] and/or Residential Mobile Crisis Unit telephone number   Reyes Ivan, [place] 01/31/2013 12:09 PM

## 2013-01-31 NOTE — Progress Notes (Signed)
Pt was discharged home today.  He denied any S/I H/I or A/V hallucinations.    He was given f/u appointment, rx, sample medications, hotline info booklet, bus pass.  He voiced understanding to all instructions provided.  He wanted information on smoking cessation which was provided at discharge along with 2 week supply and a rx. He voiced understanding to all instructions provided.

## 2013-02-02 NOTE — Progress Notes (Signed)
Patient Discharge Instructions:  After Visit Summary (AVS):   Faxed to:  02/02/13 Discharge Summary Note:   Faxed to:  02/02/13 Psychiatric Admission Assessment Note:   Faxed to:  02/02/13 Suicide Risk Assessment - Discharge Assessment:   Faxed to:  02/02/13 Faxed/Sent to the Next Level Care provider:  02/02/13 Faxed to [facility] @ [phone]  Jerelene Redden, 02/02/2013, 4:14 PM

## 2013-02-16 ENCOUNTER — Emergency Department (HOSPITAL_BASED_OUTPATIENT_CLINIC_OR_DEPARTMENT_OTHER): Payer: Medicare Other

## 2013-02-16 ENCOUNTER — Encounter (HOSPITAL_BASED_OUTPATIENT_CLINIC_OR_DEPARTMENT_OTHER): Payer: Self-pay | Admitting: *Deleted

## 2013-02-16 ENCOUNTER — Emergency Department (HOSPITAL_BASED_OUTPATIENT_CLINIC_OR_DEPARTMENT_OTHER)
Admission: EM | Admit: 2013-02-16 | Discharge: 2013-02-16 | Disposition: A | Discharge status: Home / Self Care | Payer: Medicare Other | Attending: Emergency Medicine | Admitting: Emergency Medicine

## 2013-02-16 DIAGNOSIS — R0789 Other chest pain: Secondary | ICD-10-CM | POA: Insufficient documentation

## 2013-02-16 DIAGNOSIS — J209 Acute bronchitis, unspecified: Secondary | ICD-10-CM | POA: Insufficient documentation

## 2013-02-16 DIAGNOSIS — R05 Cough: Secondary | ICD-10-CM | POA: Insufficient documentation

## 2013-02-16 DIAGNOSIS — F17200 Nicotine dependence, unspecified, uncomplicated: Secondary | ICD-10-CM | POA: Insufficient documentation

## 2013-02-16 DIAGNOSIS — Z8739 Personal history of other diseases of the musculoskeletal system and connective tissue: Secondary | ICD-10-CM | POA: Insufficient documentation

## 2013-02-16 DIAGNOSIS — R059 Cough, unspecified: Secondary | ICD-10-CM | POA: Insufficient documentation

## 2013-02-16 DIAGNOSIS — J40 Bronchitis, not specified as acute or chronic: Secondary | ICD-10-CM

## 2013-02-16 DIAGNOSIS — R51 Headache: Secondary | ICD-10-CM | POA: Insufficient documentation

## 2013-02-16 DIAGNOSIS — G894 Chronic pain syndrome: Secondary | ICD-10-CM | POA: Insufficient documentation

## 2013-02-16 MED ORDER — ALBUTEROL SULFATE HFA 108 (90 BASE) MCG/ACT IN AERS: 2.0000 | INHALATION_SPRAY | RESPIRATORY_TRACT | Status: DC | PRN

## 2013-02-16 MED ORDER — DIPHENHYDRAMINE HCL 25 MG PO TABS: 25.0000 mg | ORAL_TABLET | Freq: Four times a day (QID) | ORAL | Status: DC

## 2013-02-16 MED ORDER — DOXYCYCLINE HYCLATE 100 MG PO CAPS: 100.0000 mg | ORAL_CAPSULE | Freq: Two times a day (BID) | ORAL | Status: DC

## 2013-02-16 MED ORDER — PREDNISONE 50 MG PO TABS: ORAL_TABLET | ORAL | Status: DC

## 2013-02-16 MED ORDER — ALBUTEROL SULFATE HFA 108 (90 BASE) MCG/ACT IN AERS
2.0000 | INHALATION_SPRAY | RESPIRATORY_TRACT | Status: DC | PRN
  Administered 2013-02-16: 2 via RESPIRATORY_TRACT
  Filled 2013-02-16: qty 6.7

## 2013-02-16 NOTE — ED Notes (Signed)
Pt c/o cough and SOB x 1 month URi symptoms
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
## 2013-08-27 ENCOUNTER — Encounter (HOSPITAL_COMMUNITY): Payer: Self-pay | Admitting: Emergency Medicine

## 2013-08-27 ENCOUNTER — Emergency Department (HOSPITAL_COMMUNITY)
Admission: EM | Admit: 2013-08-27 | Discharge: 2013-08-28 | Disposition: A | Discharge status: Other Acute Inpatient Hospital | Payer: Medicare Other | Attending: Emergency Medicine | Admitting: Emergency Medicine

## 2013-08-27 DIAGNOSIS — F17200 Nicotine dependence, unspecified, uncomplicated: Secondary | ICD-10-CM | POA: Insufficient documentation

## 2013-08-27 DIAGNOSIS — R45851 Suicidal ideations: Secondary | ICD-10-CM | POA: Insufficient documentation

## 2013-08-27 DIAGNOSIS — F4321 Adjustment disorder with depressed mood: Secondary | ICD-10-CM | POA: Insufficient documentation

## 2013-08-27 DIAGNOSIS — F1020 Alcohol dependence, uncomplicated: Secondary | ICD-10-CM

## 2013-08-27 DIAGNOSIS — G894 Chronic pain syndrome: Secondary | ICD-10-CM | POA: Insufficient documentation

## 2013-08-27 DIAGNOSIS — Z79899 Other long term (current) drug therapy: Secondary | ICD-10-CM | POA: Insufficient documentation

## 2013-08-27 DIAGNOSIS — M5430 Sciatica, unspecified side: Secondary | ICD-10-CM | POA: Insufficient documentation

## 2013-08-27 DIAGNOSIS — Z87828 Personal history of other (healed) physical injury and trauma: Secondary | ICD-10-CM | POA: Insufficient documentation

## 2013-08-27 LAB — CBC
HCT: 45.7 % (ref 39.0–52.0)
MCH: 30.4 pg (ref 26.0–34.0)
MCV: 86.7 fL (ref 78.0–100.0)
Platelets: 283 10*3/uL (ref 150–400)
RDW: 13.2 % (ref 11.5–15.5)

## 2013-08-27 LAB — COMPREHENSIVE METABOLIC PANEL
AST: 26 U/L (ref 0–37)
Albumin: 4.3 g/dL (ref 3.5–5.2)
BUN: 14 mg/dL (ref 6–23)
Calcium: 9.3 mg/dL (ref 8.4–10.5)
Chloride: 101 mEq/L (ref 96–112)
Creatinine, Ser: 1.21 mg/dL (ref 0.50–1.35)
GFR calc non Af Amer: 69 mL/min — ABNORMAL LOW (ref >90)
Total Bilirubin: 0.6 mg/dL (ref 0.3–1.2)

## 2013-08-27 LAB — ETHANOL: Alcohol, Ethyl (B): 118 mg/dL — ABNORMAL HIGH (ref 0–11)

## 2013-08-27 LAB — SALICYLATE LEVEL: Salicylate Lvl: <2 mg/dL — ABNORMAL LOW (ref 2.8–20.0)

## 2013-08-27 MED ORDER — VITAMIN B-1 100 MG PO TABS
100.0000 mg | ORAL_TABLET | Freq: Every day | ORAL | Status: DC
  Administered 2013-08-27 – 2013-08-28 (×2): 100 mg via ORAL
  Filled 2013-08-27 (×2): qty 1

## 2013-08-27 MED ORDER — IBUPROFEN 200 MG PO TABS
600.0000 mg | ORAL_TABLET | Freq: Three times a day (TID) | ORAL | Status: DC | PRN
  Administered 2013-08-28: 600 mg via ORAL
  Filled 2013-08-27 (×4): qty 1

## 2013-08-27 MED ORDER — ACETAMINOPHEN 325 MG PO TABS: 650.0000 mg | ORAL_TABLET | ORAL | Status: DC | PRN

## 2013-08-27 MED ORDER — ONDANSETRON HCL 4 MG PO TABS: 4.0000 mg | ORAL_TABLET | Freq: Three times a day (TID) | ORAL | Status: DC | PRN

## 2013-08-27 MED ORDER — ZOLPIDEM TARTRATE 5 MG PO TABS: 5.0000 mg | ORAL_TABLET | Freq: Every evening | ORAL | Status: DC | PRN

## 2013-08-27 MED ORDER — LORAZEPAM 1 MG PO TABS: 1.0000 mg | ORAL_TABLET | Freq: Three times a day (TID) | ORAL | Status: DC | PRN

## 2013-08-27 MED ORDER — ALBUTEROL SULFATE HFA 108 (90 BASE) MCG/ACT IN AERS: 2.0000 | INHALATION_SPRAY | RESPIRATORY_TRACT | Status: DC | PRN

## 2013-08-27 MED ORDER — NICOTINE 21 MG/24HR TD PT24
21.0000 mg | MEDICATED_PATCH | Freq: Every day | TRANSDERMAL | Status: DC
  Administered 2013-08-27 – 2013-08-28 (×2): 21 mg via TRANSDERMAL
  Filled 2013-08-27 (×2): qty 1

## 2013-08-27 MED ORDER — LORAZEPAM 1 MG PO TABS: 0.0000 mg | ORAL_TABLET | Freq: Four times a day (QID) | ORAL | Status: DC

## 2013-08-27 MED ORDER — THIAMINE HCL 100 MG/ML IJ SOLN: 100.0000 mg | Freq: Every day | INTRAMUSCULAR | Status: DC

## 2013-08-27 MED ORDER — LORAZEPAM 1 MG PO TABS: 0.0000 mg | ORAL_TABLET | Freq: Two times a day (BID) | ORAL | Status: DC

## 2013-08-27 MED ORDER — ALUM & MAG HYDROXIDE-SIMETH 200-200-20 MG/5ML PO SUSP: 30.0000 mL | ORAL | Status: DC | PRN

## 2013-08-27 NOTE — ED Notes (Signed)
Sitter at bedside.
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
## 2013-08-27 NOTE — ED Notes (Signed)
To room from E45.   

## 2013-08-28 LAB — RAPID URINE DRUG SCREEN, HOSP PERFORMED
Amphetamines: NOT DETECTED
Benzodiazepines: NOT DETECTED
Opiates: NOT DETECTED

## 2013-08-28 MED ORDER — ADULT MULTIVITAMIN W/MINERALS CH
1.0000 | ORAL_TABLET | Freq: Every day | ORAL | Status: DC
  Administered 2013-08-28: 1 via ORAL
  Filled 2013-08-28: qty 1

## 2013-08-28 MED ORDER — FOLIC ACID 1 MG PO TABS
1.0000 mg | ORAL_TABLET | Freq: Every day | ORAL | Status: DC
  Administered 2013-08-28: 1 mg via ORAL
  Filled 2013-08-28: qty 1

## 2013-08-28 NOTE — ED Notes (Signed)
Patient is resting comfortably, with sitter at the bedside. 

## 2013-08-28 NOTE — ED Notes (Signed)
[facility] arrived for pick up.

## 2013-08-28 NOTE — ED Notes (Signed)
Patient is resting comfortably, sitter at the bedside. 

## 2013-08-28 NOTE — ED Notes (Signed)
Patient is resting comfortably, sitter at the beside.

## 2013-08-28 NOTE — ED Notes (Signed)
telepsych in progress 
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
## 2013-08-28 NOTE — ED Notes (Signed)
[facility] transport called to transport

## 2013-08-28 NOTE — Progress Notes (Signed)
The following facilities have been contacted regarding inpatient tx on pt's behalf:  [facility]- spoke with Pattricia Boss, asked that information be faxed for review [facility]- spoke with [facility], stated that they do have beds available and asked that information be faxed [facility]- spoke with Geri Seminole, asked that information be faxed and they would review and call back with disposition  Pt's referral has been faxed to all of the facilities named above.  Tomi Bamberger, MHT

## 2013-08-28 NOTE — Clinical Social Work Note (Signed)
Broadus John at [facility] called to inform CSW at this time that they would accept pt today.  CSW informed pt's nurse, Drinda Butts of this.  Drinda Butts stated that she would then call report and arrange transport.  Updated EDP Nanavati at 1422 that pt has been accepted to [facility] today, to Dr. Robet Leu.    Reyes Ivan, LCSW 08/28/2013  2:52 PM

## 2013-08-28 NOTE — ED Notes (Signed)
Patient breakfast trays being delivered, patient awake ready to eat.

## 2013-08-29 NOTE — ED Provider Notes (Signed)
Medical screening examination/treatment/procedure(s) were performed by non-physician practitioner and as supervising physician I was immediately available for consultation/collaboration.  EKG Interpretation   None         Shelda Jakes, MD 08/29/13 [phone]

## 2013-10-21 ENCOUNTER — Encounter: Payer: Self-pay | Admitting: Family Medicine

## 2013-10-21 ENCOUNTER — Other Ambulatory Visit: Payer: Self-pay | Admitting: Internal Medicine

## 2013-10-21 ENCOUNTER — Ambulatory Visit (INDEPENDENT_AMBULATORY_CARE_PROVIDER_SITE_OTHER): Payer: Medicare Other | Admitting: Family Medicine

## 2013-10-21 VITALS — BP 135/80 | HR 87 | Temp 98.1°F | Resp 18 | Ht 75.0 in | Wt 265.0 lb

## 2013-10-21 DIAGNOSIS — R635 Abnormal weight gain: Secondary | ICD-10-CM

## 2013-10-21 DIAGNOSIS — N529 Male erectile dysfunction, unspecified: Secondary | ICD-10-CM

## 2013-10-21 DIAGNOSIS — M549 Dorsalgia, unspecified: Secondary | ICD-10-CM

## 2013-10-21 DIAGNOSIS — Z23 Encounter for immunization: Secondary | ICD-10-CM

## 2013-10-21 DIAGNOSIS — F17200 Nicotine dependence, unspecified, uncomplicated: Secondary | ICD-10-CM

## 2013-10-21 DIAGNOSIS — F1020 Alcohol dependence, uncomplicated: Secondary | ICD-10-CM
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
## 2013-10-25 LAB — URINALYSIS W MICROSCOPIC + REFLEX CULTURE
BILIRUBIN URINE: NEGATIVE
Bacteria, UA: NONE SEEN
Casts: NONE SEEN
Crystals: NONE SEEN
Glucose, UA: NEGATIVE mg/dL
Hgb urine dipstick: NEGATIVE
Ketones, ur: NEGATIVE mg/dL
LEUKOCYTES UA: NEGATIVE
NITRITE: NEGATIVE
PROTEIN: NEGATIVE mg/dL
SPECIFIC GRAVITY, URINE: 1.029 (ref 1.005–1.030)
SQUAMOUS EPITHELIAL / LPF: NONE SEEN
UROBILINOGEN UA: 0.2 mg/dL (ref 0.0–1.0)
pH: 6 (ref 5.0–8.0)

## 2013-10-27 ENCOUNTER — Telehealth: Payer: Self-pay | Admitting: Internal Medicine

## 2013-10-27 ENCOUNTER — Other Ambulatory Visit: Payer: Self-pay | Admitting: Family Medicine

## 2013-10-27 NOTE — Telephone Encounter (Signed)
Left message for patient re: cancellation of lab appoint for 10/28/13.

## 2013-10-28 ENCOUNTER — Other Ambulatory Visit: Payer: Self-pay

## 2013-11-03 ENCOUNTER — Encounter: Payer: Self-pay | Admitting: Internal Medicine

## 2013-11-25 ENCOUNTER — Encounter (HOSPITAL_COMMUNITY): Payer: Self-pay | Admitting: Emergency Medicine

## 2013-11-25 ENCOUNTER — Emergency Department (HOSPITAL_COMMUNITY)
Admission: EM | Admit: 2013-11-25 | Discharge: 2013-11-25 | Disposition: A | Discharge status: Other Acute Inpatient Hospital | Payer: Medicare Other | Attending: Emergency Medicine | Admitting: Emergency Medicine

## 2013-11-25 DIAGNOSIS — F1010 Alcohol abuse, uncomplicated: Secondary | ICD-10-CM | POA: Insufficient documentation

## 2013-11-25 DIAGNOSIS — Z8739 Personal history of other diseases of the musculoskeletal system and connective tissue: Secondary | ICD-10-CM | POA: Insufficient documentation

## 2013-11-25 DIAGNOSIS — F3289 Other specified depressive episodes: Secondary | ICD-10-CM | POA: Insufficient documentation

## 2013-11-25 DIAGNOSIS — F1310 Sedative, hypnotic or anxiolytic abuse, uncomplicated: Secondary | ICD-10-CM | POA: Insufficient documentation

## 2013-11-25 DIAGNOSIS — F1910 Other psychoactive substance abuse, uncomplicated: Secondary | ICD-10-CM

## 2013-11-25 DIAGNOSIS — F1410 Cocaine abuse, uncomplicated: Secondary | ICD-10-CM | POA: Insufficient documentation

## 2013-11-25 DIAGNOSIS — Z87828 Personal history of other (healed) physical injury and trauma: Secondary | ICD-10-CM | POA: Insufficient documentation

## 2013-11-25 DIAGNOSIS — R45851 Suicidal ideations: Secondary | ICD-10-CM | POA: Insufficient documentation

## 2013-11-25 DIAGNOSIS — G894 Chronic pain syndrome: Secondary | ICD-10-CM | POA: Insufficient documentation

## 2013-11-25 DIAGNOSIS — F329 Major depressive disorder, single episode, unspecified: Secondary | ICD-10-CM | POA: Insufficient documentation

## 2013-11-25 HISTORY — DX: Other symptoms and signs involving emotional state: R45.89

## 2013-11-25 HISTORY — DX: Other symptoms and signs involving appearance and behavior: R46.89

## 2013-11-25 LAB — CBC WITH DIFFERENTIAL/PLATELET
Basophils Absolute: 0 10*3/uL (ref 0.0–0.1)
Basophils Relative: 0 % (ref 0–1)
EOS ABS: 0.2 10*3/uL (ref 0.0–0.7)
Eosinophils Relative: 3 % (ref 0–5)
HEMATOCRIT: 44.7 % (ref 39.0–52.0)
Hemoglobin: 16.1 g/dL (ref 13.0–17.0)
LYMPHS ABS: 3.2 10*3/uL (ref 0.7–4.0)
Lymphocytes Relative: 50 % — ABNORMAL HIGH (ref 12–46)
MCH: 31.1 pg (ref 26.0–34.0)
MCHC: 36 g/dL (ref 30.0–36.0)
MCV: 86.3 fL (ref 78.0–100.0)
MONO ABS: 0.6 10*3/uL (ref 0.1–1.0)
Monocytes Relative: 9 % (ref 3–12)
NEUTROS PCT: 38 % — AB (ref 43–77)
Neutro Abs: 2.4 10*3/uL (ref 1.7–7.7)
Platelets: 293 10*3/uL (ref 150–400)
RBC: 5.18 MIL/uL (ref 4.22–5.81)
RDW: 13.1 % (ref 11.5–15.5)
WBC: 6.3 10*3/uL (ref 4.0–10.5)

## 2013-11-25 LAB — COMPREHENSIVE METABOLIC PANEL
ALK PHOS: 68 U/L (ref 39–117)
ALT: 37 U/L (ref 0–53)
AST: 31 U/L (ref 0–37)
Albumin: 4.2 g/dL (ref 3.5–5.2)
BILIRUBIN TOTAL: 0.7 mg/dL (ref 0.3–1.2)
BUN: 13 mg/dL (ref 6–23)
CO2: 22 mEq/L (ref 19–32)
Calcium: 9.1 mg/dL (ref 8.4–10.5)
Chloride: 98 mEq/L (ref 96–112)
Creatinine, Ser: 1.22 mg/dL (ref 0.50–1.35)
GFR calc Af Amer: 79 mL/min — ABNORMAL LOW (ref >90)
GFR calc non Af Amer: 69 mL/min — ABNORMAL LOW (ref >90)
GLUCOSE: 116 mg/dL — AB (ref 70–99)
POTASSIUM: 3.6 meq/L — AB (ref 3.7–5.3)
Sodium: 139 mEq/L (ref 137–147)
Total Protein: 7.8 g/dL (ref 6.0–8.3)

## 2013-11-25 LAB — RAPID URINE DRUG SCREEN, HOSP PERFORMED
Amphetamines: NOT DETECTED
Barbiturates: NOT DETECTED
Benzodiazepines: NOT DETECTED
Cocaine: POSITIVE — AB
Opiates: NOT DETECTED
Tetrahydrocannabinol: NOT DETECTED

## 2013-11-25 LAB — ETHANOL: Alcohol, Ethyl (B): 40 mg/dL — ABNORMAL HIGH (ref 0–11)

## 2013-11-25 MED ORDER — POTASSIUM CHLORIDE CRYS ER 20 MEQ PO TBCR
40.0000 meq | EXTENDED_RELEASE_TABLET | Freq: Once | ORAL | Status: AC
  Administered 2013-11-25: 40 meq via ORAL
  Filled 2013-11-25: qty 2

## 2013-11-25 NOTE — ED Notes (Signed)
Telepsych set up at bedside. 

## 2013-11-25 NOTE — Progress Notes (Signed)
B.Zamier Eggebrecht, MHT received report from [facility] that patient has been accepted for treatment by Dr. Robet Leuhotakura and has bed assignment to [id number]. Writer informed Julieanne Cottonina, [facility] at [facility] that [facility] will be [facility] to [facility] and [facility]. Writer informed attending nurse Eve, RN in [facility]C who will arrange for transfer via [facility].
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
## 2013-11-25 NOTE — ED Notes (Signed)
[facility] [employer] [facility].

## 2013-11-25 NOTE — BH Assessment (Signed)
[facility].

## 2013-11-25 NOTE — ED Notes (Signed)
RN Eme gave report to [place], [facility]. Dr. Ethelda ChickJacubowitz informed pt ready for transfer.

## 2013-11-25 NOTE — ED Notes (Signed)
Pt reports he drank 4 four loco's tonight (alcoholic beverages) and snorted 1 gram of cocaine. Pt states he normally drinks more than 4 four loco's a day however tonight he did not have enough money for more, pt also reports that 1 gram of cocaine is not a lot for him, he usually does more.

## 2013-11-25 NOTE — ED Provider Notes (Signed)
7:15 PM patient is alert ambulatory not lightheaded on standing stable for transfer. Results for orders placed during the hospital encounter of 11/25/13  CBC WITH DIFFERENTIAL      Result Value Ref Range   WBC 6.3  4.0 - 10.5 K/uL   RBC 5.18  4.22 - 5.81 MIL/uL   Hemoglobin 16.1  13.0 - 17.0 g/dL   HCT 96.044.7  45.439.0 - 09.852.0 %   MCV 86.3  78.0 - 100.0 fL   MCH 31.1  26.0 - 34.0 pg   MCHC 36.0  30.0 - 36.0 g/dL   RDW 11.913.1  14.711.5 - 82.915.5 %   Platelets 293  150 - 400 K/uL   Neutrophils Relative % 38 (*) 43 - 77 %   Neutro Abs 2.4  1.7 - 7.7 K/uL   Lymphocytes Relative 50 (*) 12 - 46 %   Lymphs Abs 3.2  0.7 - 4.0 K/uL   Monocytes Relative 9  3 - 12 %   Monocytes Absolute 0.6  0.1 - 1.0 K/uL   Eosinophils Relative 3  0 - 5 %   Eosinophils Absolute 0.2  0.0 - 0.7 K/uL   Basophils Relative 0  0 - 1 %   Basophils Absolute 0.0  0.0 - 0.1 K/uL  COMPREHENSIVE METABOLIC PANEL      Result Value Ref Range   Sodium 139  137 - 147 mEq/L   Potassium 3.6 (*) 3.7 - 5.3 mEq/L   Chloride 98  96 - 112 mEq/L   CO2 22  19 - 32 mEq/L   Glucose, Bld 116 (*) 70 - 99 mg/dL   BUN 13  6 - 23 mg/dL   Creatinine, Ser 5.621.22  0.50 - 1.35 mg/dL   Calcium 9.1  8.4 - 13.010.5 mg/dL   Total Protein 7.8  6.0 - 8.3 g/dL   Albumin 4.2  3.5 - 5.2 g/dL   AST 31  0 - 37 U/L   ALT 37  0 - 53 U/L   Alkaline Phosphatase 68  39 - 117 U/L   Total Bilirubin 0.7  0.3 - 1.2 mg/dL   GFR calc non Af Amer 69 (*) >90 mL/min   GFR calc Af Amer 79 (*) >90 mL/min  URINE RAPID DRUG SCREEN (HOSP PERFORMED)      Result Value Ref Range   Opiates NONE DETECTED  NONE DETECTED   Cocaine POSITIVE (*) NONE DETECTED   Benzodiazepines NONE DETECTED  NONE DETECTED   Amphetamines NONE DETECTED  NONE DETECTED   Tetrahydrocannabinol NONE DETECTED  NONE DETECTED   Barbiturates NONE DETECTED  NONE DETECTED  ETHANOL      Result Value Ref Range   Alcohol, Ethyl (B) 40 (*) 0 - 11 mg/dL   No results found.   Doug SouSam Fotios Amos, MD 11/25/13 [id number]

## 2013-11-25 NOTE — ED Notes (Signed)
Detox from binge drinking and cocaine. Pt. Is si and hi. "I think something is mentally wrong with me."

## 2013-11-25 NOTE — ED Notes (Signed)
MD at bedside. 

## 2013-11-25 NOTE — Progress Notes (Signed)
[name], MHT completed placement search for patient by contacting the following facilities listed below;  [facility] and is currently under review [facility] accepted referral for review referral has been faxed [facility] no detox [facility] at capacity

## 2013-11-25 NOTE — ED Notes (Signed)
Meal tray ordered for patient.

## 2013-12-08 ENCOUNTER — Telehealth: Payer: Self-pay | Admitting: General Practice

## 2013-12-08 ENCOUNTER — Encounter: Payer: Self-pay | Admitting: Internal Medicine

## 2013-12-08 NOTE — Telephone Encounter (Signed)
Left message for patient to call to reschedule CPE.

## 2014-03-30 ENCOUNTER — Inpatient Hospital Stay (HOSPITAL_COMMUNITY)
Admission: AD | Admit: 2014-03-30 | Discharge: 2014-04-03 | DRG: 897 | Disposition: A | Discharge status: Home / Self Care | Payer: Medicare Other | Source: Intra-hospital | Attending: Psychiatry | Admitting: Psychiatry

## 2014-03-30 ENCOUNTER — Emergency Department (HOSPITAL_COMMUNITY)
Admission: EM | Admit: 2014-03-30 | Discharge: 2014-03-30 | Disposition: A | Discharge status: Other Acute Inpatient Hospital | Payer: Medicare Other | Source: Home / Self Care | Attending: Emergency Medicine | Admitting: Emergency Medicine

## 2014-03-30 ENCOUNTER — Encounter (HOSPITAL_COMMUNITY): Payer: Self-pay | Admitting: *Deleted

## 2014-03-30 ENCOUNTER — Encounter (HOSPITAL_COMMUNITY): Payer: Self-pay | Admitting: Emergency Medicine

## 2014-03-30 DIAGNOSIS — F332 Major depressive disorder, recurrent severe without psychotic features: Secondary | ICD-10-CM | POA: Diagnosis present

## 2014-03-30 DIAGNOSIS — G894 Chronic pain syndrome: Secondary | ICD-10-CM | POA: Diagnosis present

## 2014-03-30 DIAGNOSIS — F1020 Alcohol dependence, uncomplicated: Principal | ICD-10-CM | POA: Diagnosis present

## 2014-03-30 DIAGNOSIS — G4700 Insomnia, unspecified: Secondary | ICD-10-CM | POA: Diagnosis present

## 2014-03-30 DIAGNOSIS — Z5987 Material hardship due to limited financial resources, not elsewhere classified: Secondary | ICD-10-CM

## 2014-03-30 DIAGNOSIS — Z598 Other problems related to housing and economic circumstances: Secondary | ICD-10-CM

## 2014-03-30 DIAGNOSIS — M5430 Sciatica, unspecified side: Secondary | ICD-10-CM | POA: Diagnosis present

## 2014-03-30 DIAGNOSIS — F17200 Nicotine dependence, unspecified, uncomplicated: Secondary | ICD-10-CM | POA: Diagnosis present

## 2014-03-30 DIAGNOSIS — F1024 Alcohol dependence with alcohol-induced mood disorder: Secondary | ICD-10-CM

## 2014-03-30 DIAGNOSIS — F1410 Cocaine abuse, uncomplicated: Secondary | ICD-10-CM

## 2014-03-30 DIAGNOSIS — G479 Sleep disorder, unspecified: Secondary | ICD-10-CM

## 2014-03-30 DIAGNOSIS — F1029 Alcohol dependence with unspecified alcohol-induced disorder: Secondary | ICD-10-CM

## 2014-03-30 DIAGNOSIS — Z559 Problems related to education and literacy, unspecified: Secondary | ICD-10-CM | POA: Diagnosis not present

## 2014-03-30 LAB — URINALYSIS, ROUTINE W REFLEX MICROSCOPIC
BILIRUBIN URINE: NEGATIVE
Glucose, UA: NEGATIVE mg/dL
Hgb urine dipstick: NEGATIVE
Ketones, ur: NEGATIVE mg/dL
Leukocytes, UA: NEGATIVE
Nitrite: NEGATIVE
PROTEIN: NEGATIVE mg/dL
SPECIFIC GRAVITY, URINE: 1.014 (ref 1.005–1.030)
UROBILINOGEN UA: 1 mg/dL (ref 0.0–1.0)
pH: 5.5 (ref 5.0–8.0)

## 2014-03-30 LAB — CBC WITH DIFFERENTIAL/PLATELET
BASOS PCT: 1 % (ref 0–1)
Basophils Absolute: 0 10*3/uL (ref 0.0–0.1)
Eosinophils Absolute: 0.2 10*3/uL (ref 0.0–0.7)
Eosinophils Relative: 6 % — ABNORMAL HIGH (ref 0–5)
HCT: 44.9 % (ref 39.0–52.0)
HEMOGLOBIN: 15.5 g/dL (ref 13.0–17.0)
Lymphocytes Relative: 52 % — ABNORMAL HIGH (ref 12–46)
Lymphs Abs: 2.2 10*3/uL (ref 0.7–4.0)
MCH: 31.8 pg (ref 26.0–34.0)
MCHC: 34.5 g/dL (ref 30.0–36.0)
MCV: 92 fL (ref 78.0–100.0)
Monocytes Absolute: 0.4 10*3/uL (ref 0.1–1.0)
Monocytes Relative: 10 % (ref 3–12)
NEUTROS ABS: 1.3 10*3/uL — AB (ref 1.7–7.7)
NEUTROS PCT: 31 % — AB (ref 43–77)
PLATELETS: 176 10*3/uL (ref 150–400)
RBC: 4.88 MIL/uL (ref 4.22–5.81)
RDW: 14 % (ref 11.5–15.5)
WBC: 4.1 10*3/uL (ref 4.0–10.5)

## 2014-03-30 LAB — BASIC METABOLIC PANEL
ANION GAP: 16 — AB (ref 5–15)
BUN: 10 mg/dL (ref 6–23)
CHLORIDE: 103 meq/L (ref 96–112)
CO2: 22 mEq/L (ref 19–32)
Calcium: 8.4 mg/dL (ref 8.4–10.5)
Creatinine, Ser: 0.93 mg/dL (ref 0.50–1.35)
GFR calc non Af Amer: >90 mL/min (ref >90)
Glucose, Bld: 110 mg/dL — ABNORMAL HIGH (ref 70–99)
POTASSIUM: 3.7 meq/L (ref 3.7–5.3)
Sodium: 141 mEq/L (ref 137–147)

## 2014-03-30 LAB — RAPID URINE DRUG SCREEN, HOSP PERFORMED
AMPHETAMINES: NOT DETECTED
Barbiturates: NOT DETECTED
Benzodiazepines: NOT DETECTED
COCAINE: POSITIVE — AB
Opiates: NOT DETECTED
TETRAHYDROCANNABINOL: NOT DETECTED

## 2014-03-30 LAB — ETHANOL: ALCOHOL ETHYL (B): 154 mg/dL — AB (ref 0–11)

## 2014-03-30 MED ORDER — CHLORDIAZEPOXIDE HCL 25 MG PO CAPS
25.0000 mg | ORAL_CAPSULE | ORAL | Status: DC
  Filled 2014-03-30 (×2): qty 1

## 2014-03-30 MED ORDER — CHLORDIAZEPOXIDE HCL 25 MG PO CAPS
50.0000 mg | ORAL_CAPSULE | Freq: Once | ORAL | Status: AC
  Administered 2014-03-30: 50 mg via ORAL

## 2014-03-30 MED ORDER — VITAMIN B-1 100 MG PO TABS
100.0000 mg | ORAL_TABLET | Freq: Every day | ORAL | Status: DC
  Administered 2014-03-30: 100 mg via ORAL
  Filled 2014-03-30: qty 1

## 2014-03-30 MED ORDER — CHLORDIAZEPOXIDE HCL 25 MG PO CAPS
ORAL_CAPSULE | ORAL | Status: AC
  Filled 2014-03-30: qty 2

## 2014-03-30 MED ORDER — LORAZEPAM 1 MG PO TABS: 0.0000 mg | ORAL_TABLET | Freq: Two times a day (BID) | ORAL | Status: DC

## 2014-03-30 MED ORDER — LORAZEPAM 1 MG PO TABS: 0.0000 mg | ORAL_TABLET | Freq: Four times a day (QID) | ORAL | Status: DC

## 2014-03-30 MED ORDER — LOPERAMIDE HCL 2 MG PO CAPS: 2.0000 mg | ORAL_CAPSULE | ORAL | Status: AC | PRN

## 2014-03-30 MED ORDER — ADULT MULTIVITAMIN W/MINERALS CH
1.0000 | ORAL_TABLET | Freq: Every day | ORAL | Status: DC
Start: 2014-03-30 — End: 2014-04-03
  Administered 2014-03-30 – 2014-04-03 (×5): 1 via ORAL
  Filled 2014-03-30 (×7): qty 1

## 2014-03-30 MED ORDER — ALUM & MAG HYDROXIDE-SIMETH 200-200-20 MG/5ML PO SUSP
30.0000 mL | ORAL | Status: DC | PRN
  Administered 2014-04-02 – 2014-04-03 (×2): 30 mL via ORAL

## 2014-03-30 MED ORDER — HYDROXYZINE HCL 25 MG PO TABS
25.0000 mg | ORAL_TABLET | Freq: Four times a day (QID) | ORAL | Status: AC | PRN
Start: 2014-03-30 — End: 2014-04-02

## 2014-03-30 MED ORDER — CHLORDIAZEPOXIDE HCL 25 MG PO CAPS
25.0000 mg | ORAL_CAPSULE | Freq: Four times a day (QID) | ORAL | Status: AC
  Administered 2014-03-30 – 2014-04-01 (×6): 25 mg via ORAL
  Filled 2014-03-30 (×6): qty 1

## 2014-03-30 MED ORDER — ACETAMINOPHEN 325 MG PO TABS: 650.0000 mg | ORAL_TABLET | Freq: Four times a day (QID) | ORAL | Status: DC | PRN

## 2014-03-30 MED ORDER — MAGNESIUM HYDROXIDE 400 MG/5ML PO SUSP: 30.0000 mL | Freq: Every day | ORAL | Status: DC | PRN

## 2014-03-30 MED ORDER — THIAMINE HCL 100 MG/ML IJ SOLN: 100.0000 mg | Freq: Every day | INTRAMUSCULAR | Status: DC

## 2014-03-30 MED ORDER — CHLORDIAZEPOXIDE HCL 25 MG PO CAPS
25.0000 mg | ORAL_CAPSULE | Freq: Three times a day (TID) | ORAL | Status: AC
  Administered 2014-04-01 – 2014-04-02 (×2): 25 mg via ORAL
  Filled 2014-03-30 (×2): qty 1

## 2014-03-30 MED ORDER — ONDANSETRON 4 MG PO TBDP: 4.0000 mg | ORAL_TABLET | Freq: Four times a day (QID) | ORAL | Status: AC | PRN

## 2014-03-30 MED ORDER — CHLORDIAZEPOXIDE HCL 25 MG PO CAPS
25.0000 mg | ORAL_CAPSULE | Freq: Four times a day (QID) | ORAL | Status: AC | PRN
  Filled 2014-03-30: qty 1

## 2014-03-30 MED ORDER — ACETAMINOPHEN 325 MG PO TABS
650.0000 mg | ORAL_TABLET | Freq: Once | ORAL | Status: AC
  Administered 2014-03-30: 650 mg via ORAL
  Filled 2014-03-30: qty 2

## 2014-03-30 MED ORDER — CHLORDIAZEPOXIDE HCL 25 MG PO CAPS: 25.0000 mg | ORAL_CAPSULE | Freq: Every day | ORAL | Status: DC

## 2014-03-30 MED ORDER — ALBUTEROL SULFATE HFA 108 (90 BASE) MCG/ACT IN AERS: 2.0000 | INHALATION_SPRAY | RESPIRATORY_TRACT | Status: DC | PRN

## 2014-03-30 MED ORDER — VITAMIN B-1 100 MG PO TABS
100.0000 mg | ORAL_TABLET | Freq: Every day | ORAL | Status: DC
  Administered 2014-03-31 – 2014-04-03 (×4): 100 mg via ORAL
  Filled 2014-03-30 (×5): qty 1

## 2014-03-30 MED ORDER — THIAMINE HCL 100 MG/ML IJ SOLN: 100.0000 mg | Freq: Once | INTRAMUSCULAR | Status: DC

## 2014-03-30 NOTE — Tx Team (Signed)
Initial Interdisciplinary Treatment Plan  PATIENT STRENGTHS: (choose at least two) Ability for insight Average or above average intelligence Capable of independent living General fund of knowledge Motivation for treatment/growth  PATIENT STRESSORS: Substance abuse   PROBLEM LIST: Problem List/Patient Goals Date to be addressed Date deferred Reason deferred Estimated date of resolution  Alcohol Abuse 03/30/14                                                      DISCHARGE CRITERIA:  Ability to meet basic life and health needs Improved stabilization in mood, thinking, and/or behavior Withdrawal symptoms are absent or subacute and managed without 24-hour nursing intervention  PRELIMINARY DISCHARGE PLAN: Attend aftercare/continuing care group Return to previous living arrangement  PATIENT/FAMIILY INVOLVEMENT: This treatment plan has been presented to and reviewed with the patient, [name], and/or family member, .  The patient and family have been given the opportunity to ask questions and make suggestions.  [name], [name] 03/30/2014, 6:52 PM

## 2014-03-30 NOTE — ED Notes (Signed)
All of pt belongings and valuables sent with [facility] via [facility].
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
## 2014-03-30 NOTE — ED Notes (Signed)
Dr. Radford PaxBeaton made aware of pt's NBP, advised to recheck in 30 minutes.
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
## 2014-03-30 NOTE — ED Notes (Addendum)
EDP made aware of pt's elevated NBP, MD said this is to be expected with recent drug use, no treatment necessary at this time.

## 2014-03-30 NOTE — ED Notes (Signed)
[facility] unavailable at this time. Given number to call back.

## 2014-03-30 NOTE — ED Notes (Signed)
Pt here for alcohol detox from beer. Last drink 20 minutes before arrival. Denies any SI/HI. No nausea or vomiting or diarrhea

## 2014-03-30 NOTE — Progress Notes (Signed)
48 year old male pt admitted on voluntary basis requesting detox from ETOH. Pt reports depression but denies any SI and able to contract for safety on the unit. Pt reports that he has a place to live and will go there after discharge. Pt reports that he has no PCP currently and is also not on any medications. Pt reports that he would like to quit drinking. Pt reported that he has been in various facilities in the past. Pt does report weight loss recently due to decreased appetite. Pt was oriented to the unit and safety maintained.

## 2014-03-30 NOTE — Progress Notes (Signed)
Patient did not attend the evening karaoke group. Pt was newly admitted to unit and remained in his bed.

## 2014-03-30 NOTE — ED Notes (Addendum)
Spoke with patient about alcohol abuse history. Pt states he has been drinking since he was a teenager and alcohol abuse has always been a problem for him. Pt states he was sober for 7 years during the 90's. His main drinks of choice are beer and gin. Pt states he has no average amount that he drinks each day he just drinks all day everyday. Pt states he was recently treated at a 28 day  facility but relapsed after his sister became ill. Pt also states he also uses drugs after alcohol gets in his system. He states he has no drug of choice but does not use IV drugs.

## 2014-03-30 NOTE — Progress Notes (Signed)
  CARE MANAGEMENT ED NOTE 03/30/2014  Patient:  [name],[name]   Account Number:  [id number]  Date Initiated:  03/30/2014  Documentation initiated by:  Ferdinand CavaSCHETTINO,Woodrow Drab  Subjective/Objective Assessment:   48 yo male presenting to the ED requesting ETOH detox     Subjective/Objective Assessment Detail:     Action/Plan:   Encouraged patient to follow up with Medicaid PCP from provided list   Action/Plan Detail:   Anticipated DC Date:       Status Recommendation to Physician:   Result of Recommendation:  Agreed    DC Planning Services  CM consult  PCP issues    Choice offered to / List presented to:  C-1 Patient          Status of service:  Completed, signed off  ED Comments:   ED Comments Detail:  CM spoke with the patient regarding PCP. Patient stated that he currently does not have a PCP, he did state that Dr. Ashley RoyaltyMatthews was his PCP. Discussed establishing care again with Dr. Ashley RoyaltyMatthews or providing him with a list of Medicaid providing PCPs. The patient stated that he has been having a hard time getting an appointment with Dr. Ashley RoyaltyMatthews. He stated that she has a wait list now. This CM then provided the patient with a list of Medicaid accepting PCPs and explained that he needs to call and find out if accepting new patients prior to establishing care. Patient verbalized understanding and had no further questions.

## 2014-03-30 NOTE — ED Notes (Signed)
[facility]  Called to transport.

## 2014-03-31 MED ORDER — ENSURE COMPLETE PO LIQD
237.0000 mL | Freq: Two times a day (BID) | ORAL | Status: DC
  Administered 2014-03-31 – 2014-04-03 (×6): 237 mL via ORAL

## 2014-03-31 MED ORDER — LISINOPRIL 10 MG PO TABS
10.0000 mg | ORAL_TABLET | Freq: Every day | ORAL | Status: DC
  Administered 2014-03-31 – 2014-04-03 (×4): 10 mg via ORAL
  Filled 2014-03-31: qty 2
  Filled 2014-03-31 (×5): qty 1

## 2014-03-31 MED ORDER — METAXALONE 800 MG PO TABS
400.0000 mg | ORAL_TABLET | Freq: Three times a day (TID) | ORAL | Status: DC
  Administered 2014-03-31 (×2): 400 mg via ORAL
  Administered 2014-04-01: 08:00:00 via ORAL
  Administered 2014-04-01 – 2014-04-02 (×4): 400 mg via ORAL
  Filled 2014-03-31 (×12): qty 1

## 2014-03-31 NOTE — Progress Notes (Signed)
Patient ID: Ronald GoldmannCarl Park, male   DOB: [birth year], 48 y.o.   MRN: [id number]  Patient currently asleep; no s/s of distress noted. Respirations regular and unlabored.
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
## 2014-03-31 NOTE — [facility] Group Notes (Signed)
[facility] LCSW Group Therapy  03/31/2014 3:08 PM  Type of Therapy:  Group Therapy  Participation Level:  Did Not Attend -pt in room sleeping.  Smart, Prajna Vanderpool LCSWA  03/31/2014, 3:08 PM
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
## 2014-03-31 NOTE — Progress Notes (Signed)
D:  Per pt self inventory pt reports sleeping well, appetite improving--reports a recent unintentional weight loss of 40lbs, energy level low, ability to pay attention improving, rates depression at 2 out of 10 and hopelessness at a 0 out of 10, denies SI/HI/AVH at this time.  A:  Emotional support provided, Dietitian consult ordered, Encouraged pt to continue with treatment plan and attend all group activities, q15 min checks maintained for safety.  R:  Pt is going to groups, calm and pleasant, cooperative with staff and other patients.

## 2014-03-31 NOTE — [facility] Group Notes (Signed)
[facility]   03/31/2014 9:57 AM  Participation Quality:  Appropriate   Mood/Affect:  Flat  Depression Rating:  2  Anxiety Rating:  0  Thoughts of Suicide:  No Will you contract for safety?   NA  Current AVH:  No  Plan for Discharge/Comments:  Pt reports that he is here for alcohol detox only and plans to return home at d/c where he lives with his sister. Pt reports mild withdrawals today and has strong family support. No followup at this time and he does not take mental health meds.   Transportation Means: family   Supports: Chiropractorfamily/sister  Smart, OncologistHeather LCSWA

## 2014-03-31 NOTE — Care Management Utilization Note (Signed)
Per State Regulation 482.30  The chart was reviewed for necessity with respect to the patient's Admission/ Duration of stay. Admit 03/30/14  Next Review Date: 04/02/14  Lacinda AxonAlice Jovanne Riggenbach, RN, BSN

## 2014-03-31 NOTE — [facility] Group Notes (Signed)
Adult Psychoeducational Group Note  Date:  03/31/2014 Time:  11:13 PM  Group Topic/Focus:  AA Meeting  Participation Level:  Did Not Attend  Participation Quality:  None  Affect:  None  Cognitive:  None  Insight: None  Engagement in Group:  None  Modes of Intervention:  Discussion and Education  Additional Comments:  [name] did not attend group.  Caroll RancherLindsay, Emmamae Mcnamara A 03/31/2014, 11:13 PM

## 2014-03-31 NOTE — Progress Notes (Signed)
NUTRITION ASSESSMENT  Pt identified as at risk on the Malnutrition Screen Tool.  Consult received secondary to 30 lbs weight loss.  INTERVENTION: 1. Educated patient on the importance of nutrition and encouraged intake of food and beverages. 2. Discussed weight goals. 3. Supplements: MVI and thiamine daily.  Ensure Complete po BID, each supplement provides 350 kcal and 13 grams of protein   NUTRITION DIAGNOSIS: Unintentional weight loss related to sub-optimal intake as evidenced by pt report.   Goal: Pt to meet >/= 90% of their estimated nutrition needs.  Monitor:  PO intake  Assessment:  Patient admitted with etoh abuse.   Patient known to this RD from admit 01/31/13. Patient weight weight of 229 lbs 1 year ago. 267 lbs in February 2015 with weight loss of 46 lbs (17%) in the past 5 months secondary to etoh abuse and depression.   "I am embarrassed to see people because of all of the weight I have lost." Weight currently WNL for body frame and structure but patient with increased muscle and prefers to weight 240 lbs. Currently eating well. Patient meets criteria for mild/moderate malnutrition related to social/environmental causes AEB intake <75% of estimated needs for >/=3 months and a 17 % weight loss in the past 5 months.    48 y.o. male  Height: Ht Readings from Last 1 Encounters:  03/30/14 6\' 3"  (1.905 m)    Weight: Wt Readings from Last 1 Encounters:  03/30/14 221 lb (100.245 kg)    Weight Hx: Wt Readings from Last 10 Encounters:  03/30/14 221 lb (100.245 kg)  03/30/14 240 lb (108.863 kg)  11/25/13 240 lb (108.863 kg)  10/21/13 265 lb (120.203 kg)  08/27/13 228 lb 11.2 oz (103.738 kg)  02/16/13 236 lb (107.049 kg)  01/29/13 229 lb (103.874 kg)  02/14/12 229 lb 9.6 oz (104.146 kg)  01/29/12 230 lb (104.327 kg)    BMI:  Body mass index is 27.62 kg/(m^2). Normal weight for body frame and structure.  Estimated Nutritional Needs: Kcal: 25-30  kcal/kg Protein: > 1 gram protein/kg Fluid: 1 ml/kcal  Diet Order: General Pt is also offered choice of unit snacks mid-morning and mid-afternoon.  Pt is eating as desired.   Lab results and medications reviewed.   Oran ReinLaura Nassim Cosma, RD, LDN Clinical Inpatient Dietitian Pager:  [phone] Weekend and after hours pager:  [phone]

## 2014-03-31 NOTE — [facility] Suicide Risk Assessment (Signed)
[facility] INPATIENT: Family/Significant Other Suicide Prevention Education   Suicide Prevention Education:  Education Completed; No one has been identified by the patient as the family member/significant other with whom the patient will be residing, and identified as the person(s) who will aid the patient in the event of a mental health crisis (suicidal ideations/suicide attempt).   Pt did not c/o SI at admission, nor have they endorsed SI during their stay here. SPE not required. SPI pamphlet provided to pt and he was encouraged to share information with his support network, ask questions, and talk about any concerns.   The suicide prevention education provided includes the following:  Suicide risk factors  Suicide prevention and interventions  National Suicide Hotline telephone number  [facility] assessment telephone number  [facility] Emergency Assistance 911  [facility] and/or [facility]idential [facility] Crisis Unit telephone number  Reyes IvanChelsea Horton, [place]CS[place] 03/31/2014  11:01 AM
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
## 2014-03-31 NOTE — Tx Team (Signed)
Interdisciplinary Treatment Plan Update (Adult)  Date: 03/31/2014   Time Reviewed: 11:24 AM  Progress in Treatment:  Attending groups: Yes  Participating in groups:  Yes  Taking medication as prescribed: Yes  Tolerating medication: Yes  Family/Significant othe contact made: No. SPE not required for this pt.   Patient understands diagnosis: Yes, AEB seeking treatment for ETOH detox and mood stabilization.  Discussing patient identified problems/goals with staff: Yes  Medical problems stabilized or resolved: Yes  Denies suicidal/homicidal ideation: Yes during admission, group, and self report.  Patient has not harmed self or Others: Yes  New problem(s) identified:  Discharge Plan or Barriers: Pt plans to return home at d/c and [facility]. No meds prescribed for mental health currently.  Additional comments:  48 year old male pt admitted on voluntary basis requesting detox from ETOH. Pt reports depression but denies any SI and able to contract for safety on the unit. Pt reports that he has a place to live and will go there after discharge. Pt reports that he has no PCP currently and is also not on any medications. Pt reports that he would like to quit drinking. Pt reported that he has been in various facilities in the past. Pt does report weight loss recently due to decreased appetite. Pt was oriented to the unit and safety maintained.    Reason for Continuation of Hospitalization: Librium taper-withdrawals Mood stabilization Estimated length of stay: 3-5 days  For review of initial/current patient goals, please see plan of care.  Attendees:  Patient:    Family:    Physician: Dr. Jama Flavorsobos MD 03/31/2014 11:23 AM   Nursing: Meryl DareJennifer P. RN 03/31/2014 11:24 AM   Clinical Social Worker Krystine Pabst Smart, LCSWA  03/31/2014 11:24 AM   Other: Sue LushAndrea RN 03/31/2014 11:24 AM   Other: Chandra BatchAggie N. PA 03/31/2014 11:24 AM   Other: Darden DatesJennifer C. Nurse CM 03/31/2014 11:24 AM   Other:    Scribe for  Treatment Team:  Trula SladeHeather Smart LCSWA 03/31/2014 11:24 AM

## 2014-04-01 DIAGNOSIS — F332 Major depressive disorder, recurrent severe without psychotic features: Secondary | ICD-10-CM

## 2014-04-01 DIAGNOSIS — F1010 Alcohol abuse, uncomplicated: Secondary | ICD-10-CM

## 2014-04-01 NOTE — [facility] Group Notes (Signed)
[facility] Group Notes:  (Nursing/MHT/Case Management/Adjunct)  Date:  04/01/2014  Time:  1:57 PM  Type of Therapy:  Psychoeducational Skills  Participation Level:  Did Not Attend  Buford DresserForrest, Ninel Abdella Shanta 04/01/2014, 1:57 PM
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
## 2014-04-01 NOTE — Progress Notes (Signed)
Patient ID: Ronald GoldmannCarl Park, male   DOB: 1965/10/01, 48 y.o.   MRN: [id number] Pt with limited visibility in the milieu.  Interacting appropriately with staff.  Pt did not attend morning group.  Pt reported depression but stated "it's not bad".  Support offered. Pt receptive.  Needs assessed.  Pt denied.  Fifteen minute checks in progress for patient safety.  Pt safe on unit.
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
## 2014-04-01 NOTE — [facility] Group Notes (Signed)
[facility] Group Notes:  (Nursing/MHT/Case Management/Adjunct)  Date:  04/01/2014  Time:  12:45 PM  Type of Therapy:  Psychoeducational Skills  Participation Level:  Did Not Attend  Buford DresserForrest, Marai Teehan Shanta 04/01/2014, 12:45 PM

## 2014-04-01 NOTE — [facility] Group Notes (Signed)
[facility] Group Notes: (Clinical Social Work)   04/01/2014      Type of Therapy:  Group Therapy   Participation Level:  Did Not Attend    Ambrose MantleMareida Grossman-Orr, LCSW 04/01/2014, 11:58 AM

## 2014-04-01 NOTE — Progress Notes (Signed)
Patient ID: Baruch GoldmannCarl Mckoy, male   DOB: 08-May-1966, 48 y.o.   MRN: [id number] D)  Has been resting quietly tonight, eyes closed, resp reg, unlabored, no c/o's voiced.  A)  Will continue to monitor for safety, continue POC R)  Safety maintained.

## 2014-04-02 NOTE — Progress Notes (Addendum)
Patient ID: Ronald GoldmannCarl Park, male   DOB: Aug 06, 1966, 48 y.o.   MRN: [id number] D: Patient has been in bed this morning electing not to take medications or go to group.  He was up at 1130 and requested his medications.  Patient denies any SI/HI/AVH.  He presents with a pleasant affect and mood.  He was not forthcoming with any information.  He denies any withdrawal symptoms and stated he really didn't think he needed the librium, however, he took it. Patient plans to return home after discharge.  A: Continue to monitor medication management and MD orders.  Safety checks completed every 15 minutes per protocol.  R: Patient has minimal interaction with staff.
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
## 2014-04-02 NOTE — [facility] Group Notes (Signed)
[facility] Group Notes:  (Nursing/MHT/Case Management/Adjunct)  Date:  04/02/2014  Time:  4:00 PM  Type of Therapy:  Psychoeducational Skills  Participation Level:  Active  Participation Quality:  Appropriate  Affect:  Appropriate  Cognitive:  Appropriate  Insight:  Appropriate  Engagement in Group:  Engaged  Modes of Intervention:  Discussion  Summary of Progress/Problems: Pt did attend self inventory group, pt reported that he was negative SI/HI, no AH/VH noted. Pt rated his depression as a 0, and his helplessness/hopelessness as a 0.     Pt reported concerns about being discharged on Monday, pt advised that the doctor will be made aware.    [name], [name] 04/02/2014, 4:00 PM

## 2014-04-02 NOTE — Progress Notes (Signed)
Patient did not attend the evening speaker AA meeting. Pt was notified that group was beginning but remained in his bed.  

## 2014-04-02 NOTE — [facility] Group Notes (Signed)
[facility] Group Notes:  (Nursing/MHT/Case Management/Adjunct)  Date:  04/02/2014  Time:  4:19 PM  Type of Therapy:  Psychoeducational Skills  Participation Level:  Did Not Attend   Buford DresserForrest, Nerissa Constantin Shanta 04/02/2014, 4:19 PM

## 2014-04-02 NOTE — Progress Notes (Signed)
[facility] Group Notes:  (Nursing/MHT/Case Management/Adjunct)  Date:  04/02/2014  Time:  6:33 PM  Type of Therapy:  Psychoeducational Skills  Participation Level:  Did not attend  Modes of Intervention:  Activity  Summary of Progress/Problems: Pts played a game of Pictionary using coping skills.  Isobella Ascher C 04/02/2014, 6:33 PM 

## 2014-04-02 NOTE — [facility] Group Notes (Signed)
[facility] Group Notes:  (Clinical Social Work)  04/02/2014  10:00-11:00AM  Summary of Progress/Problems:   The main focus of today's process group was to   identify the patient's current support system and decide on other supports that can be put in place.  The picture on workbook was used to discuss why additional supports are needed.  An emphasis was placed on using counselor, doctor, therapy groups, 12-step groups, and problem-specific support groups to expand supports.   There was also an extensive discussion about what constitutes a healthy support versus an unhealthy support.  The patient expressed full comprehension of the concepts presented, and agreed that there is a need to add more supports.  The patient stated one thing he has to do for himself if to not go to his mother's house because it is in a neighborhood where everyone has bottles all the time.  He stated he has to get involved in a 12-step program, and has to start loving himself and respecting others.  Type of Therapy:  Process Group with Motivational Interviewing  Participation Level:  Active  Participation Quality:  Attentive, Sharing and Supportive  Affect:  Blunted  Cognitive:  Appropriate and Oriented  Insight:  Developing/Improving  Engagement in Therapy:  Engaged  Modes of Intervention:   Education, Support and Processing, Activity  Pilgrim's PrideMareida Grossman-Orr, LCSW 04/02/2014, 12:15pm

## 2014-04-02 NOTE — Progress Notes (Signed)
Adult Activity Group Note  Date:  04/02/2014  Time:  8:43 AM  Group Topic:  Mental Health Jeopardy  Participation Level:  Active  Participation Quality:  Appropriate and Attentive  Affect:  Appropriate  Engagement in Group:  Engaged  Activity: Patients participated in jeopardy like game, answering questions relating to mental health awareness in categories such as Symptoms, Medications, Causes, Coping Skills, and Substance Abuse.    Epifanio Labrador C 04/02/2014, 8:43 AM 

## 2014-04-03 DIAGNOSIS — F1020 Alcohol dependence, uncomplicated: Principal | ICD-10-CM

## 2014-04-03 DIAGNOSIS — F1994 Other psychoactive substance use, unspecified with psychoactive substance-induced mood disorder: Secondary | ICD-10-CM

## 2014-04-03 DIAGNOSIS — F1410 Cocaine abuse, uncomplicated: Secondary | ICD-10-CM

## 2014-04-03 MED ORDER — METAXALONE 400 MG PO TABS: 400.0000 mg | ORAL_TABLET | Freq: Three times a day (TID) | ORAL | Status: DC

## 2014-04-03 MED ORDER — LISINOPRIL 10 MG PO TABS: 10.0000 mg | ORAL_TABLET | Freq: Every day | ORAL | Status: DC

## 2014-04-03 NOTE — Progress Notes (Signed)
[facility] :  Will you be returning to the same living situation after discharge: Yes,  [place], family is supportive At discharge, do you have transportation home?:Yes,  a friend will pick pt up Do you have the ability to pay for your medications:Yes,  provided it with samples and prescriptions and [facility] with [facility] med[facility].    Release of information consent forms completed and in the chart;  Patient's signature needed at discharge.  Patient to Follow up at: Follow-up Information   Follow up with [facility] On 04/05/2014. (Walk in on this date for hospital discharge appointment. Walk in clinic is Monday - Friday 8 am - 3 pm. They will than schedule you for medication management and therapy. )    Contact information:   [address] [place], [place] [address] Phone: [phone] Fax: [phone]      Patient denies SI/HI:   Yes,  denies SI/HI    Safety Planning and Suicide Prevention discussed:  Yes,  discussed with pt.  N/A to contact family/friend due to no SI on admission.  See suicide prevention education note.   Carmina MillerHorton, Mikenzie Mccannon Nicole 04/03/2014, 10:02 AM
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
## 2014-04-03 NOTE — Progress Notes (Signed)
Patient ID: Ronald BentonBaruch Goldmann, male   DOB: 03/23/1966, 48 y.o.   MRN: [id number]  D: Patient pleasant on approach this am. Remains on librium protocol but did not feel like he needed the librium this am so he did not take. Also did not take the skelaxin this morning. Joking with undersigned about football. Reports mood improvement with no SI at this time. A: Staff will monitor on q 15 minute checks, follow treatment plan, and give meds as ordered.  R: Cooperative on the unit. No issues to report today.

## 2014-04-03 NOTE — Progress Notes (Signed)
Patient ID: Ronald GoldmannCarl Pounds, male   DOB: 07/15/1966, 48 y.o.   MRN: [id number] D)  Has spent most of the evening in bed, stated was tired and didn't feel like going to group, states is feeling much better, however, denies w/d sx.  Refused hs librium but did want skelaxin for his back, and went back to bed.  States feels happy about going to live with his sister, hoping it will work out well for both of them.  Feels he can use what he has learned to keep himself on track. A)  Will continue to monitor for safety, continue POC R)  Safety maintained.

## 2014-04-03 NOTE — Progress Notes (Signed)
Patient ID: Ronald GoldmannCarl Park, male   DOB: [birth year], 48 y.o.   MRN: [id number]  D: Patient at baseline of functioning. No withdrawal symptoms or any SI at present. Reports that his ride is outside at this time. A: Given prescriptions, sample meds, follow -up appointment, and patient satisfaction survey. R: Taken to locker to get belongings and let outside
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
## 2014-04-06 NOTE — Progress Notes (Signed)
Patient Discharge Instructions:  After Visit Summary (AVS):   Faxed to:  04/06/14 Discharge Summary Note:   Faxed to:  04/06/14 Psychiatric Admission Assessment Note:   Faxed to:  04/06/14 Suicide Risk Assessment - Discharge Assessment:   Faxed to:  04/06/14 Faxed/Sent to the Next Level Care provider:  04/06/14 [facility] @ [phone]  Jerelene ReddenSheena E [place], 04/06/2014, 3:07 PM

## 2014-04-24 ENCOUNTER — Emergency Department (HOSPITAL_COMMUNITY)
Admission: EM | Admit: 2014-04-24 | Discharge: 2014-04-25 | Disposition: A | Discharge status: Home / Self Care | Payer: Medicare Other | Attending: Emergency Medicine | Admitting: Emergency Medicine

## 2014-04-24 ENCOUNTER — Encounter (HOSPITAL_COMMUNITY): Payer: Self-pay | Admitting: Emergency Medicine

## 2014-04-24 DIAGNOSIS — F1410 Cocaine abuse, uncomplicated: Secondary | ICD-10-CM | POA: Insufficient documentation

## 2014-04-24 DIAGNOSIS — F3289 Other specified depressive episodes: Secondary | ICD-10-CM | POA: Diagnosis not present

## 2014-04-24 DIAGNOSIS — G894 Chronic pain syndrome: Secondary | ICD-10-CM | POA: Diagnosis not present

## 2014-04-24 DIAGNOSIS — Z79899 Other long term (current) drug therapy: Secondary | ICD-10-CM | POA: Diagnosis not present

## 2014-04-24 DIAGNOSIS — F329 Major depressive disorder, single episode, unspecified: Secondary | ICD-10-CM | POA: Diagnosis not present

## 2014-04-24 DIAGNOSIS — F17200 Nicotine dependence, unspecified, uncomplicated: Secondary | ICD-10-CM | POA: Insufficient documentation

## 2014-04-24 DIAGNOSIS — F32A Depression, unspecified: Secondary | ICD-10-CM

## 2014-04-24 DIAGNOSIS — F1010 Alcohol abuse, uncomplicated: Secondary | ICD-10-CM | POA: Insufficient documentation

## 2014-04-24 DIAGNOSIS — F1910 Other psychoactive substance abuse, uncomplicated: Secondary | ICD-10-CM

## 2014-04-24 LAB — SALICYLATE LEVEL: Salicylate Lvl: <2 mg/dL — ABNORMAL LOW (ref 2.8–20.0)

## 2014-04-24 LAB — COMPREHENSIVE METABOLIC PANEL
ALK PHOS: 66 U/L (ref 39–117)
ALT: 29 U/L (ref 0–53)
AST: 33 U/L (ref 0–37)
Albumin: 4.2 g/dL (ref 3.5–5.2)
Anion gap: 17 — ABNORMAL HIGH (ref 5–15)
BILIRUBIN TOTAL: 0.8 mg/dL (ref 0.3–1.2)
BUN: 9 mg/dL (ref 6–23)
CO2: 23 mEq/L (ref 19–32)
Calcium: 9.2 mg/dL (ref 8.4–10.5)
Chloride: 97 mEq/L (ref 96–112)
Creatinine, Ser: 1 mg/dL (ref 0.50–1.35)
GFR calc Af Amer: >90 mL/min (ref >90)
GFR calc non Af Amer: 87 mL/min — ABNORMAL LOW (ref >90)
Glucose, Bld: 122 mg/dL — ABNORMAL HIGH (ref 70–99)
POTASSIUM: 3.5 meq/L — AB (ref 3.7–5.3)
Sodium: 137 mEq/L (ref 137–147)
Total Protein: 7.8 g/dL (ref 6.0–8.3)

## 2014-04-24 LAB — RAPID URINE DRUG SCREEN, HOSP PERFORMED
AMPHETAMINES: NOT DETECTED
BARBITURATES: NOT DETECTED
BENZODIAZEPINES: POSITIVE — AB
Cocaine: POSITIVE — AB
Opiates: NOT DETECTED
Tetrahydrocannabinol: NOT DETECTED

## 2014-04-24 LAB — CBC
HEMATOCRIT: 48.7 % (ref 39.0–52.0)
Hemoglobin: 16.6 g/dL (ref 13.0–17.0)
MCH: 31.1 pg (ref 26.0–34.0)
MCHC: 34.1 g/dL (ref 30.0–36.0)
MCV: 91.4 fL (ref 78.0–100.0)
PLATELETS: 231 10*3/uL (ref 150–400)
RBC: 5.33 MIL/uL (ref 4.22–5.81)
RDW: 13.4 % (ref 11.5–15.5)
WBC: 4.1 10*3/uL (ref 4.0–10.5)

## 2014-04-24 LAB — ACETAMINOPHEN LEVEL: Acetaminophen (Tylenol), Serum: <15 ug/mL (ref 10–30)

## 2014-04-24 LAB — ETHANOL: Alcohol, Ethyl (B): 78 mg/dL — ABNORMAL HIGH (ref 0–11)

## 2014-04-24 MED ORDER — VITAMIN B-1 100 MG PO TABS
100.0000 mg | ORAL_TABLET | Freq: Every day | ORAL | Status: DC
  Administered 2014-04-24: 100 mg via ORAL
  Filled 2014-04-24: qty 1

## 2014-04-24 MED ORDER — ZOLPIDEM TARTRATE 5 MG PO TABS: 5.0000 mg | ORAL_TABLET | Freq: Every evening | ORAL | Status: DC | PRN

## 2014-04-24 MED ORDER — LORAZEPAM 1 MG PO TABS: 0.0000 mg | ORAL_TABLET | Freq: Four times a day (QID) | ORAL | Status: DC

## 2014-04-24 MED ORDER — ALUM & MAG HYDROXIDE-SIMETH 200-200-20 MG/5ML PO SUSP: 30.0000 mL | ORAL | Status: DC | PRN

## 2014-04-24 MED ORDER — LORAZEPAM 1 MG PO TABS: 0.0000 mg | ORAL_TABLET | Freq: Two times a day (BID) | ORAL | Status: DC

## 2014-04-24 MED ORDER — NICOTINE 21 MG/24HR TD PT24
21.0000 mg | MEDICATED_PATCH | Freq: Every day | TRANSDERMAL | Status: DC
  Administered 2014-04-24: 21 mg via TRANSDERMAL
  Filled 2014-04-24: qty 1

## 2014-04-24 MED ORDER — THIAMINE HCL 100 MG/ML IJ SOLN: 100.0000 mg | Freq: Every day | INTRAMUSCULAR | Status: DC

## 2014-04-24 MED ORDER — ONDANSETRON HCL 4 MG PO TABS: 4.0000 mg | ORAL_TABLET | Freq: Three times a day (TID) | ORAL | Status: DC | PRN

## 2014-04-24 MED ORDER — IBUPROFEN 200 MG PO TABS: 600.0000 mg | ORAL_TABLET | Freq: Three times a day (TID) | ORAL | Status: DC | PRN

## 2014-04-24 NOTE — ED Notes (Addendum)
Pt. Has a pair of jeans, white t-shirt, sandals, keys, a wallet, and a HTC cell phone. Pt. Belongings are in locker 30.
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
## 2014-04-24 NOTE — ED Notes (Signed)
Pt requesting detox from everything such cocaine, ETOH, Xanax. Last use of everything this morning. Pt also request to go to a mental ward after detox program.

## 2014-04-24 NOTE — ED Notes (Signed)
Bed: [facility] Expected date:  Expected time:  Means of arrival:  Comments: 
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
## 2014-04-25 ENCOUNTER — Inpatient Hospital Stay (HOSPITAL_COMMUNITY): Admission: AD | Admit: 2014-04-25 | Payer: Medicare Other | Source: Intra-hospital | Admitting: Psychiatry

## 2014-04-25 DIAGNOSIS — F1010 Alcohol abuse, uncomplicated: Secondary | ICD-10-CM | POA: Diagnosis not present

## 2014-04-25 NOTE — ED Provider Notes (Signed)
Medical screening examination/treatment/procedure(s) were performed by non-physician practitioner and as supervising physician I was immediately available for consultation/collaboration.   EKG Interpretation None        Shanna CiscoMegan E Docherty, MD 04/25/14 1424

## 2014-04-25 NOTE — Progress Notes (Addendum)
Per Dr. Ladona Ridgelaylor patient will be referred to a [facility] Program.  Per Dr. Ladona Ridgelaylor the patient is psychiatrically stable.

## 2014-04-25 NOTE — Consult Note (Signed)
  Psychiatric Specialty Exam: Physical Exam  ROS  Blood pressure 149/93, pulse 83, temperature 97.7 F (36.5 C), temperature source Oral, resp. rate 19, SpO2 95.00%.There is no weight on file to calculate BMI.  General Appearance: Well Groomed  Patent attorneyye Contact::  Good  Speech:  Clear and Coherent  Volume:  Normal  Mood:  Depressed  Affect:  Congruent  Thought Process:  Coherent and Logical  Orientation:  Full (Time, Place, and Person)  Thought Content:  Negative  Suicidal Thoughts:  No  Homicidal Thoughts:  No  Memory:  Immediate;   Good Recent;   Good Remote;   Good  Judgement:  Good  Insight:  Fair  Psychomotor Activity:  Normal  Concentration:  Good  Recall:  Good  Akathisia:  Negative  Handed:  Right  AIMS (if indicated):     Assets:  Communication Skills Housing Physical Health  Sleep:   good  Mr Eliseo GumBenton wants to be inpatient but does not meet the criteria and no rehab is available to take him directly today.  Consequently, the plan is to refer him to [facility].  He says he will drink if discharged but we cannot hold him just to keep him sober.  He denies suicidal or homicidal ideation.  Discharge him home today to be followed outpatient.

## 2014-04-25 NOTE — Consult Note (Signed)
Review of Systems   Constitutional: Negative.    HENT: Negative.    Eyes: Negative.    Respiratory: Negative.    Cardiovascular: Negative.    Gastrointestinal: Negative.    Genitourinary: Negative.    Musculoskeletal: Negative.    Skin: Negative.    Neurological: Negative.    Endo/Heme/Allergies: Negative.    Psychiatric/Behavioral: Positive for substance abuse.
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
## 2014-04-29 ENCOUNTER — Encounter (HOSPITAL_COMMUNITY): Payer: Self-pay | Admitting: Emergency Medicine

## 2014-04-29 ENCOUNTER — Emergency Department (HOSPITAL_COMMUNITY)
Admission: EM | Admit: 2014-04-29 | Discharge: 2014-04-30 | Disposition: A | Discharge status: Other Acute Inpatient Hospital | Payer: Medicare Other | Source: Home / Self Care | Attending: Emergency Medicine | Admitting: Emergency Medicine

## 2014-04-29 DIAGNOSIS — G894 Chronic pain syndrome: Secondary | ICD-10-CM

## 2014-04-29 DIAGNOSIS — Z8739 Personal history of other diseases of the musculoskeletal system and connective tissue: Secondary | ICD-10-CM

## 2014-04-29 DIAGNOSIS — F1010 Alcohol abuse, uncomplicated: Secondary | ICD-10-CM | POA: Insufficient documentation

## 2014-04-29 DIAGNOSIS — F17200 Nicotine dependence, unspecified, uncomplicated: Secondary | ICD-10-CM | POA: Insufficient documentation

## 2014-04-29 DIAGNOSIS — F14220 Cocaine dependence with intoxication, uncomplicated: Secondary | ICD-10-CM | POA: Diagnosis present

## 2014-04-29 DIAGNOSIS — F1410 Cocaine abuse, uncomplicated: Secondary | ICD-10-CM | POA: Insufficient documentation

## 2014-04-29 DIAGNOSIS — I10 Essential (primary) hypertension: Secondary | ICD-10-CM | POA: Diagnosis not present

## 2014-04-29 DIAGNOSIS — F1020 Alcohol dependence, uncomplicated: Secondary | ICD-10-CM

## 2014-04-29 DIAGNOSIS — Z008 Encounter for other general examination: Secondary | ICD-10-CM | POA: Insufficient documentation

## 2014-04-29 HISTORY — DX: Essential (primary) hypertension: I10

## 2014-04-29 LAB — RAPID URINE DRUG SCREEN, HOSP PERFORMED
AMPHETAMINES: NOT DETECTED
BENZODIAZEPINES: NOT DETECTED
Barbiturates: NOT DETECTED
Cocaine: POSITIVE — AB
Opiates: NOT DETECTED
Tetrahydrocannabinol: NOT DETECTED

## 2014-04-29 LAB — COMPREHENSIVE METABOLIC PANEL
ALBUMIN: 4.3 g/dL (ref 3.5–5.2)
ALT: 25 U/L (ref 0–53)
ANION GAP: 17 — AB (ref 5–15)
AST: 29 U/L (ref 0–37)
Alkaline Phosphatase: 58 U/L (ref 39–117)
BUN: 11 mg/dL (ref 6–23)
CO2: 22 mEq/L (ref 19–32)
CREATININE: 0.93 mg/dL (ref 0.50–1.35)
Calcium: 8.8 mg/dL (ref 8.4–10.5)
Chloride: 100 mEq/L (ref 96–112)
GFR calc non Af Amer: >90 mL/min (ref >90)
GLUCOSE: 95 mg/dL (ref 70–99)
Potassium: 4.1 mEq/L (ref 3.7–5.3)
Sodium: 139 mEq/L (ref 137–147)
TOTAL PROTEIN: 7.8 g/dL (ref 6.0–8.3)
Total Bilirubin: 0.4 mg/dL (ref 0.3–1.2)

## 2014-04-29 LAB — CBC WITH DIFFERENTIAL/PLATELET
Basophils Absolute: 0 10*3/uL (ref 0.0–0.1)
Basophils Relative: 0 % (ref 0–1)
EOS ABS: 0.2 10*3/uL (ref 0.0–0.7)
Eosinophils Relative: 5 % (ref 0–5)
HEMATOCRIT: 46.7 % (ref 39.0–52.0)
HEMOGLOBIN: 16.2 g/dL (ref 13.0–17.0)
Lymphocytes Relative: 64 % — ABNORMAL HIGH (ref 12–46)
Lymphs Abs: 2.7 10*3/uL (ref 0.7–4.0)
MCH: 31.7 pg (ref 26.0–34.0)
MCHC: 34.7 g/dL (ref 30.0–36.0)
MCV: 91.4 fL (ref 78.0–100.0)
MONO ABS: 0.2 10*3/uL (ref 0.1–1.0)
MONOS PCT: 5 % (ref 3–12)
NEUTROS PCT: 26 % — AB (ref 43–77)
Neutro Abs: 1.1 10*3/uL — ABNORMAL LOW (ref 1.7–7.7)
Platelets: 206 10*3/uL (ref 150–400)
RBC: 5.11 MIL/uL (ref 4.22–5.81)
RDW: 13.3 % (ref 11.5–15.5)
WBC: 4.3 10*3/uL (ref 4.0–10.5)

## 2014-04-29 LAB — ETHANOL: Alcohol, Ethyl (B): 199 mg/dL — ABNORMAL HIGH (ref 0–11)

## 2014-04-29 MED ORDER — ZOLPIDEM TARTRATE 5 MG PO TABS: 5.0000 mg | ORAL_TABLET | Freq: Every evening | ORAL | Status: DC | PRN

## 2014-04-29 MED ORDER — NICOTINE 21 MG/24HR TD PT24: 21.0000 mg | MEDICATED_PATCH | Freq: Every day | TRANSDERMAL | Status: DC

## 2014-04-29 MED ORDER — LORAZEPAM 1 MG PO TABS
0.0000 mg | ORAL_TABLET | Freq: Four times a day (QID) | ORAL | Status: DC
Start: 2014-04-29 — End: 2014-04-30
  Administered 2014-04-29: 1 mg via ORAL
  Filled 2014-04-29: qty 1

## 2014-04-29 MED ORDER — LORAZEPAM 1 MG PO TABS
0.0000 mg | ORAL_TABLET | Freq: Two times a day (BID) | ORAL | Status: DC
Start: 2014-05-01 — End: 2014-04-30

## 2014-04-29 MED ORDER — HYDROCHLOROTHIAZIDE 12.5 MG PO CAPS
12.5000 mg | ORAL_CAPSULE | Freq: Every day | ORAL | Status: DC
  Administered 2014-04-29 – 2014-04-30 (×2): 12.5 mg via ORAL
  Filled 2014-04-29 (×2): qty 1

## 2014-04-29 MED ORDER — SODIUM CHLORIDE 0.9 % IV BOLUS (SEPSIS)
1000.0000 mL | Freq: Once | INTRAVENOUS | Status: AC
  Administered 2014-04-29: 1000 mL via INTRAVENOUS

## 2014-04-29 MED ORDER — VITAMIN B-1 100 MG PO TABS
100.0000 mg | ORAL_TABLET | Freq: Every day | ORAL | Status: DC
  Administered 2014-04-29 – 2014-04-30 (×2): 100 mg via ORAL
  Filled 2014-04-29 (×2): qty 1

## 2014-04-29 MED ORDER — LORAZEPAM 1 MG PO TABS: 1.0000 mg | ORAL_TABLET | Freq: Three times a day (TID) | ORAL | Status: DC | PRN

## 2014-04-29 MED ORDER — THIAMINE HCL 100 MG/ML IJ SOLN: 100.0000 mg | Freq: Every day | INTRAMUSCULAR | Status: DC

## 2014-04-29 MED ORDER — THIAMINE HCL 100 MG/ML IJ SOLN
100.0000 mg | Freq: Once | INTRAMUSCULAR | Status: AC
Start: 2014-04-29 — End: 2014-04-29
  Administered 2014-04-29: 100 mg via INTRAVENOUS
  Filled 2014-04-29: qty 2

## 2014-04-29 MED ORDER — IBUPROFEN 400 MG PO TABS: 600.0000 mg | ORAL_TABLET | Freq: Three times a day (TID) | ORAL | Status: DC | PRN

## 2014-04-29 MED ORDER — ONDANSETRON HCL 4 MG PO TABS: 4.0000 mg | ORAL_TABLET | Freq: Three times a day (TID) | ORAL | Status: DC | PRN

## 2014-04-29 NOTE — ED Notes (Signed)
The pt is here for detox from alcohol he drank just before he came through the door.  He also had his last cocaine yesterday

## 2014-04-29 NOTE — ED Notes (Signed)
Hospital security at bedside to wand patient.

## 2014-04-29 NOTE — ED Notes (Signed)
Pt not being discharged at this time.

## 2014-04-29 NOTE — ED Notes (Signed)
Contacted Will at [facility] in [place]-- no beds available currently. Informed pt, he will recontact tomorrow.

## 2014-04-29 NOTE — ED Notes (Signed)
Dr order blood pressure medication now
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
## 2014-04-29 NOTE — BH Assessment (Signed)
Received call for assessment. Spoke to Dr. Thurnell LoseYow who said Pt is presenting for treatment from alcohol and cocaine abuse. Tele-assessment will be initiated.  Harlin RainFord Ellis Ria CommentWarrick Jr, LPC, [facility] Triage Specialist [phone]

## 2014-04-29 NOTE — ED Notes (Signed)
Pt walked from POD B 15 to POD C 24 with nurse.

## 2014-04-29 NOTE — ED Notes (Signed)
Patient presents stating that he is looking for detox from alcohol.  To this nurse denied any drugs.   Last drank just PTA.

## 2014-04-30 ENCOUNTER — Encounter (HOSPITAL_COMMUNITY): Payer: Self-pay | Admitting: *Deleted

## 2014-04-30 ENCOUNTER — Observation Stay (HOSPITAL_COMMUNITY)
Admission: AD | Admit: 2014-04-30 | Discharge: 2014-05-01 | Disposition: A | Discharge status: Home / Self Care | Payer: Medicare Other | Source: Intra-hospital | Attending: Psychiatry | Admitting: Psychiatry

## 2014-04-30 DIAGNOSIS — F14220 Cocaine dependence with intoxication, uncomplicated: Secondary | ICD-10-CM

## 2014-04-30 DIAGNOSIS — F17200 Nicotine dependence, unspecified, uncomplicated: Secondary | ICD-10-CM | POA: Insufficient documentation

## 2014-04-30 DIAGNOSIS — F1010 Alcohol abuse, uncomplicated: Secondary | ICD-10-CM | POA: Diagnosis not present

## 2014-04-30 DIAGNOSIS — F1410 Cocaine abuse, uncomplicated: Secondary | ICD-10-CM | POA: Insufficient documentation

## 2014-04-30 DIAGNOSIS — F1020 Alcohol dependence, uncomplicated: Secondary | ICD-10-CM | POA: Diagnosis present

## 2014-04-30 DIAGNOSIS — G894 Chronic pain syndrome: Secondary | ICD-10-CM | POA: Insufficient documentation

## 2014-04-30 DIAGNOSIS — F331 Major depressive disorder, recurrent, moderate: Secondary | ICD-10-CM

## 2014-04-30 DIAGNOSIS — I10 Essential (primary) hypertension: Secondary | ICD-10-CM | POA: Insufficient documentation

## 2014-04-30 DIAGNOSIS — F1994 Other psychoactive substance use, unspecified with psychoactive substance-induced mood disorder: Secondary | ICD-10-CM

## 2014-04-30 MED ORDER — CLONIDINE HCL 0.1 MG PO TABS
0.1000 mg | ORAL_TABLET | Freq: Once | ORAL | Status: AC
  Administered 2014-04-30: 0.1 mg via ORAL
  Filled 2014-04-30: qty 1

## 2014-04-30 MED ORDER — VITAMIN B-1 100 MG PO TABS: 100.0000 mg | ORAL_TABLET | Freq: Every day | ORAL | Status: DC

## 2014-04-30 MED ORDER — HYDROXYZINE HCL 25 MG PO TABS: 25.0000 mg | ORAL_TABLET | Freq: Four times a day (QID) | ORAL | Status: DC | PRN

## 2014-04-30 MED ORDER — ONDANSETRON HCL 4 MG PO TABS: 4.0000 mg | ORAL_TABLET | Freq: Three times a day (TID) | ORAL | Status: DC | PRN

## 2014-04-30 MED ORDER — LORAZEPAM 1 MG PO TABS: 0.0000 mg | ORAL_TABLET | Freq: Two times a day (BID) | ORAL | Status: DC

## 2014-04-30 MED ORDER — ONDANSETRON 4 MG PO TBDP: 4.0000 mg | ORAL_TABLET | Freq: Four times a day (QID) | ORAL | Status: DC | PRN

## 2014-04-30 MED ORDER — LOPERAMIDE HCL 2 MG PO CAPS: 2.0000 mg | ORAL_CAPSULE | ORAL | Status: DC | PRN

## 2014-04-30 MED ORDER — IBUPROFEN 600 MG PO TABS: 600.0000 mg | ORAL_TABLET | Freq: Three times a day (TID) | ORAL | Status: DC | PRN

## 2014-04-30 MED ORDER — LORAZEPAM 1 MG PO TABS: 1.0000 mg | ORAL_TABLET | Freq: Every day | ORAL | Status: DC

## 2014-04-30 MED ORDER — ACETAMINOPHEN 325 MG PO TABS: 650.0000 mg | ORAL_TABLET | Freq: Four times a day (QID) | ORAL | Status: DC | PRN

## 2014-04-30 MED ORDER — ALUM & MAG HYDROXIDE-SIMETH 200-200-20 MG/5ML PO SUSP: 30.0000 mL | ORAL | Status: DC | PRN

## 2014-04-30 MED ORDER — ADULT MULTIVITAMIN W/MINERALS CH
1.0000 | ORAL_TABLET | Freq: Every day | ORAL | Status: DC
  Administered 2014-04-30 – 2014-05-01 (×2): 1 via ORAL
  Filled 2014-04-30 (×5): qty 1

## 2014-04-30 MED ORDER — LORAZEPAM 1 MG PO TABS
1.0000 mg | ORAL_TABLET | Freq: Three times a day (TID) | ORAL | Status: DC
  Administered 2014-05-01 (×2): 1 mg via ORAL
  Filled 2014-04-30 (×2): qty 1

## 2014-04-30 MED ORDER — MAGNESIUM HYDROXIDE 400 MG/5ML PO SUSP: 30.0000 mL | Freq: Every day | ORAL | Status: DC | PRN

## 2014-04-30 MED ORDER — LORAZEPAM 1 MG PO TABS: 0.0000 mg | ORAL_TABLET | Freq: Four times a day (QID) | ORAL | Status: DC

## 2014-04-30 MED ORDER — LORAZEPAM 1 MG PO TABS: 1.0000 mg | ORAL_TABLET | Freq: Four times a day (QID) | ORAL | Status: DC | PRN

## 2014-04-30 MED ORDER — VITAMIN B-1 100 MG PO TABS
100.0000 mg | ORAL_TABLET | Freq: Every day | ORAL | Status: DC
  Administered 2014-05-01: 100 mg via ORAL
  Filled 2014-04-30 (×2): qty 1

## 2014-04-30 MED ORDER — LORAZEPAM 1 MG PO TABS: 1.0000 mg | ORAL_TABLET | Freq: Two times a day (BID) | ORAL | Status: DC

## 2014-04-30 MED ORDER — LORAZEPAM 1 MG PO TABS: 1.0000 mg | ORAL_TABLET | Freq: Three times a day (TID) | ORAL | Status: DC | PRN

## 2014-04-30 MED ORDER — LORAZEPAM 1 MG PO TABS
1.0000 mg | ORAL_TABLET | Freq: Four times a day (QID) | ORAL | Status: AC
  Administered 2014-04-30 (×2): 1 mg via ORAL
  Filled 2014-04-30 (×2): qty 1

## 2014-04-30 MED ORDER — HYDROCHLOROTHIAZIDE 12.5 MG PO CAPS
12.5000 mg | ORAL_CAPSULE | Freq: Every day | ORAL | Status: DC
  Administered 2014-04-30 – 2014-05-01 (×2): 12.5 mg via ORAL
  Filled 2014-04-30 (×4): qty 1

## 2014-04-30 NOTE — Progress Notes (Signed)
Patient ID: [name], male   DOB: Mar 15, 1966, 48 y.o.   MRN: [id number] 04-30-2014 nursing admission note: pt came to unit voluntarily requesting detox from etoh and cocaine. He stated he is intermittently homeless staying with various friend and relatives. He has been in this facility before and denies any si/hi/av presently. He had 7 months of sober time and relapsed, he stated,  due to his father's death in 2007. He is requesting long term care again.  He is not having any overt signs of symptoms of withdrawal and was very pleasant on admission, being humorous at times. Denies any si/hi/av. His medical hx is herpes, htn and chronic back problems. He has a stimulator implanted in his back. He will remain in the observation area until his disposition is determined.

## 2014-04-30 NOTE — ED Notes (Signed)
[facility] HAS ARRIVED TO TRANSPORT. PT BELONGINGS AND VALUABLES GIVEN TO [facility] TO TAKE TO [facility]

## 2014-04-30 NOTE — ED Notes (Signed)
[facility] CALLED TO TRANSPORT 
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
## 2014-04-30 NOTE — BH Assessment (Signed)
Assessment complete. Gave clinical report to Alberteen SamFran Hobson, NP who recommended Pt [facility] once Pt's blood pressure is under 150/90. Gretta ArabKenisha Herbin, AC [facility], confirmed bed availability. Notified Dr. Wilkie AyeHorton and Lavone NianPaul Smith, RN at [facility] of recommendation.  Harlin RainFord Ellis Ria CommentWarrick Jr, LPC, [facility] Triage Specialist [phone]
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
## 2014-04-30 NOTE — ED Provider Notes (Signed)
Spoke with behavioral health assessment. Patient can be admitted to the observation unit that his blood pressure needs to improve to less than 150/90. No history of high blood pressure. Patient already received low dose HCTZ.  Patient ordered clonidine 0.1 mg.  Albi Rappaport F HoShon Batonrton, MD 04/30/14 [phone]

## 2014-04-30 NOTE — ED Provider Notes (Signed)
Medical screening examination/treatment/procedure(s) were conducted as a shared visit with non-physician practitioner(s) or resident and myself. I personally evaluated the patient during the encounter and agree with the findings.  I have personally reviewed any xrays and/ or EKG's with the provider and I agree with interpretation.  Patient presents to the ER for detox from alcohol. Patient drinks daily for years and lashes cocaine yesterday last drink this morning. Patient drinks alternative alcohol. Patient denies fevers, head injury, headache or other symptoms. On exam patient has normal heart rate, comfortable, cranial nerves intact, no nystagmus, no shaking on exam, abdomen soft nontender. Plan for screening labs and behavior health assessment to determine inpatient versus outpatient detox.   Alcohol abuse, cocaine abuse, detox   Enid SkeensJoshua M Alverda Nazzaro, MD 04/30/14 [phone]

## 2014-04-30 NOTE — [facility] Suicide Risk Assessment (Signed)
Suicide Risk Assessment  Admission Assessment     Nursing information obtained from:  Patient Demographic factors:  Male;Divorced or widowed;Unemployed Current Mental Status:  NA (denies si/hi/av. ) Loss Factors:  Loss of significant relationship (father died. ) Historical Factors:  Family history of mental illness or substance abuse Risk Reduction Factors:  Responsible for children under 49 years of age;Sense of responsibility to family;Religious beliefs about death;Positive social support Total Time spent with patient: 15 minutes  CLINICAL FACTORS:   Alcohol/Substance Abuse/Dependencies  Psychiatric Specialty Exam:     Blood pressure 135/102, pulse 89, temperature 98.3 F (36.8 C), temperature source Oral, resp. rate 16, height 6\' 3"  (1.905 m), weight 101.606 kg (224 lb), SpO2 99.00%.Body mass index is 28 kg/(m^2).  General Appearance: Fairly Groomed  Patent attorneyye Contact::  Good  Speech:  Clear and Coherent  Volume:  Normal  Mood:  Variable  Affect:  Congruent  Thought Process:  Intact  Orientation:  Full (Time, Place, and Person)  Thought Content:  WDL wants residential treatment program  Suicidal Thoughts:  No  Homicidal Thoughts:  No  Memory:  Negative  Judgement:  Poor  Insight:  Lacking and Shallow  Psychomotor Activity:  Normal  Concentration:  Fair  Recall:  [employer]  Fund of Knowledge:Fair  Language: Fair  Akathisia:  NA  Handed:  Right  AIMS (if indicated):  NA  Assets:  Desire for Improvement Financial Resources/Insurance 4 yo Daughter  Sleep:  Drug disturbed   Musculoskeletal: Strength & Muscle Tone: within normal limits Gait & Station: normal Patient leans: N/A  COGNITIVE FEATURES THAT CONTRIBUTE TO RISK:  Loss of executive function    SUICIDE RISK:   Minimal: No identifiable suicidal ideation.  Patients presenting with no risk factors but with morbid ruminations; may be classified as minimal risk based on the severity of the depressive symptoms  PLAN OF  CARE:Admit to OBs to free ED bed.Arrange residential treatment  I certify that inpatient services furnished can reasonably be expected to improve the patient's condition.  Court JoyKOBER, CHARLES E 04/30/2014, 3:42 PM

## 2014-04-30 NOTE — ED Notes (Signed)
PAT[facility]

## 2014-04-30 NOTE — ED Notes (Signed)
Patient on phone with family to get them to bring him clothing etc. He is agreeable to going to behavioral health.

## 2014-04-30 NOTE — Progress Notes (Signed)
Patient appear pleasant. Denies pain, SI, AH/VH at this time. No new complaint. Will continue to monitor patient.

## 2014-05-01 DIAGNOSIS — F1410 Cocaine abuse, uncomplicated: Secondary | ICD-10-CM

## 2014-05-01 DIAGNOSIS — F1010 Alcohol abuse, uncomplicated: Secondary | ICD-10-CM

## 2014-05-01 NOTE — Plan of Care (Signed)
[facility] Observation Crisis Plan  Reason for Crisis Plan:  Substance Abuse   Plan of Care:  Residential Substance Abuse Rehabilitation  Family Support:    Adult son, sister  Current Living Environment:  Living Arrangements: Non-relatives/Friends; Homeless  Ins[facility]  [facility]   Name Acct ID Class Status Primary Coverage   [name], [name] [id number] BEHAVIORAL HEALTH OBSERVATION Open MEDICARE - MEDICARE PART A AND B        Guarantor Account (for Hospital Account [id number])   Name Relation to Pt Service Area Active? Acct Type   Ronald MayoBenton, [name] Self [facility] Yes Behavioral Health   Address Phone       [name], [name] [phone])          Coverage Information (for Hospital Account [id number])   1. MEDICARE/MEDICARE PART A AND B   F/O Payor/Plan Precert #   MEDICARE/MEDICARE PART A AND B    Subscriber Subscriber #   [name], [name] [id number]   Address Phone   [address] [place], [name]        2. [place]   F/O Payor/Plan Precert #   [facility]/[place]    Subscriber Subscriber #   [name], [name] [id number]   Address Phone   [address] [place], [place] [address] [phone]          Legal Guardian:   Self  Primary Care Provider:  No PCP Per Patient  Current Outpatient Providers:  None  Psychiatrist:   None  Counselor/Therapist:   None  Compliant with Medications:  Yes; Pt is not currently on any medication; pt has an implanted TENS device.  Additional Information: After consulting with Ronald Headonrad Withrow, NP it has been determined that pt does not present a life threatening danger to himself or others, and that psychiatric hospitalization is not indicated for him at this time.  He would, however, benefit from placement in a residential substance abuse rehabilitation facility.  He has an appointment for a screening [facility] at [facility] tomorrow,  Tuesday, 05/02/2014 at 08:00.  This information will be included in his discharge instructions.  Ronald Canninghomas Kelliann Pendergraph, MA Triage Specialist [name], [name] 8/10/20154:14 PM

## 2014-05-01 NOTE — Progress Notes (Signed)
Patient ID: Ronald GoldmannCarl Park, male   DOB: 20-May-1966, 48 y.o.   MRN: [id number] Discharge Note-Seen by Renata Capriceonrad NP determined that he can be discharged to home if unable to find a rehab placement for him today. Tom H. Arranged for him to have a screening appt with [facility] tomorrow at 8 am. Reviewed with him his discharge plans. He verbalized his understanding of the plans. He is upbeat about his plans. He called for transportation.All personal belongings returned to him. He denies any thoughts to hurt self or others. Is not psychotic No complaints voiced on discharge.

## 2014-05-01 NOTE — Progress Notes (Signed)
Patient ID: Baruch GoldmannCarl Sia, male   DOB: 1965/12/28, 48 y.o.   MRN: [id number] D-Wanting a long term placement for alcohol treatment. He has been to many treatment centers and is knowledgeable about many of them. He was trying to make his own discharge arrangements by calling several centers and asked that we fax [facility]. Passed this information on to Tom H,.the disposition case Financial controllerworker. He is pleasant. Denies any distress and CIWA is 0. A-Monitored for safety. Medications as ordered. Support offered. R-No complaints. Waiting to see the extender to know his plan after OBS. He is currently homeless with few supports.

## 2014-05-01 NOTE — Discharge Instructions (Signed)
To help you maintain a sober lifestyle, you are scheduled for a screening assessment at [facility] tomorrow, Tuesday, 05/02/2014 at 8:00 am.  While screening does not guarantee that you will be admitted, you should be prepared to be admitted:       [facility]      [address]      [place], [place] [address]      [phone]  If you have a behavioral health crisis you have several options, all of which are available 24 hours a day, 7 days a week:  *Call Mobile Crisis and a clinician will come to you: [phone] *Call 911 *Go to the [facility] at [address], [place], [place] *Go to your local hospital emergency department
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
## 2014-05-03 NOTE — Discharge Summary (Signed)
Patient seen, evaluated by me. Patient is back to baseline and can be discharged with outpatient resources

## 2014-05-03 NOTE — H&P (Signed)
Patient admitted to [facility] observation unit for detox and treatment 

## 2014-05-13 ENCOUNTER — Encounter (HOSPITAL_COMMUNITY): Payer: Self-pay | Admitting: Emergency Medicine

## 2014-05-13 ENCOUNTER — Encounter (HOSPITAL_COMMUNITY): Payer: Self-pay | Admitting: *Deleted

## 2014-05-13 ENCOUNTER — Observation Stay (HOSPITAL_COMMUNITY)
Admission: AD | Admit: 2014-05-13 | Discharge: 2014-05-14 | Disposition: A | Discharge status: Home / Self Care | Payer: Medicare Other | Source: Intra-hospital | Attending: Psychiatry | Admitting: Psychiatry

## 2014-05-13 ENCOUNTER — Emergency Department (EMERGENCY_DEPARTMENT_HOSPITAL)
Admission: EM | Admit: 2014-05-13 | Discharge: 2014-05-13 | Disposition: A | Discharge status: Other Acute Inpatient Hospital | Payer: Medicare Other | Source: Home / Self Care | Attending: Emergency Medicine | Admitting: Emergency Medicine

## 2014-05-13 DIAGNOSIS — F1410 Cocaine abuse, uncomplicated: Secondary | ICD-10-CM | POA: Diagnosis not present

## 2014-05-13 DIAGNOSIS — F17200 Nicotine dependence, unspecified, uncomplicated: Secondary | ICD-10-CM | POA: Insufficient documentation

## 2014-05-13 DIAGNOSIS — F14220 Cocaine dependence with intoxication, uncomplicated: Secondary | ICD-10-CM

## 2014-05-13 DIAGNOSIS — F1999 Other psychoactive substance use, unspecified with unspecified psychoactive substance-induced disorder: Secondary | ICD-10-CM | POA: Diagnosis not present

## 2014-05-13 DIAGNOSIS — F19921 Other psychoactive substance use, unspecified with intoxication with delirium: Secondary | ICD-10-CM | POA: Insufficient documentation

## 2014-05-13 DIAGNOSIS — I10 Essential (primary) hypertension: Secondary | ICD-10-CM | POA: Insufficient documentation

## 2014-05-13 DIAGNOSIS — F10939 Alcohol use, unspecified with withdrawal, unspecified: Secondary | ICD-10-CM | POA: Diagnosis not present

## 2014-05-13 DIAGNOSIS — R45850 Homicidal ideations: Secondary | ICD-10-CM | POA: Insufficient documentation

## 2014-05-13 DIAGNOSIS — G894 Chronic pain syndrome: Secondary | ICD-10-CM | POA: Insufficient documentation

## 2014-05-13 DIAGNOSIS — F10230 Alcohol dependence with withdrawal, uncomplicated: Secondary | ICD-10-CM | POA: Diagnosis present

## 2014-05-13 DIAGNOSIS — F10239 Alcohol dependence with withdrawal, unspecified: Secondary | ICD-10-CM

## 2014-05-13 DIAGNOSIS — F1020 Alcohol dependence, uncomplicated: Secondary | ICD-10-CM | POA: Diagnosis present

## 2014-05-13 DIAGNOSIS — F1420 Cocaine dependence, uncomplicated: Secondary | ICD-10-CM

## 2014-05-13 DIAGNOSIS — Z8739 Personal history of other diseases of the musculoskeletal system and connective tissue: Secondary | ICD-10-CM | POA: Insufficient documentation

## 2014-05-13 DIAGNOSIS — F1010 Alcohol abuse, uncomplicated: Secondary | ICD-10-CM

## 2014-05-13 DIAGNOSIS — Z008 Encounter for other general examination: Secondary | ICD-10-CM | POA: Insufficient documentation

## 2014-05-13 DIAGNOSIS — F1910 Other psychoactive substance abuse, uncomplicated: Secondary | ICD-10-CM

## 2014-05-13 LAB — COMPREHENSIVE METABOLIC PANEL
ALT: 32 U/L (ref 0–53)
AST: 34 U/L (ref 0–37)
Albumin: 4.3 g/dL (ref 3.5–5.2)
Alkaline Phosphatase: 48 U/L (ref 39–117)
Anion gap: 16 — ABNORMAL HIGH (ref 5–15)
BUN: 11 mg/dL (ref 6–23)
CALCIUM: 8.9 mg/dL (ref 8.4–10.5)
CO2: 21 mEq/L (ref 19–32)
CREATININE: 0.83 mg/dL (ref 0.50–1.35)
Chloride: 103 mEq/L (ref 96–112)
GFR calc Af Amer: >90 mL/min (ref >90)
GFR calc non Af Amer: >90 mL/min (ref >90)
Glucose, Bld: 81 mg/dL (ref 70–99)
Potassium: 3.8 mEq/L (ref 3.7–5.3)
SODIUM: 140 meq/L (ref 137–147)
TOTAL PROTEIN: 7.8 g/dL (ref 6.0–8.3)
Total Bilirubin: 0.4 mg/dL (ref 0.3–1.2)

## 2014-05-13 LAB — RAPID URINE DRUG SCREEN, HOSP PERFORMED
Amphetamines: NOT DETECTED
BARBITURATES: NOT DETECTED
BENZODIAZEPINES: NOT DETECTED
COCAINE: POSITIVE — AB
OPIATES: NOT DETECTED
TETRAHYDROCANNABINOL: NOT DETECTED

## 2014-05-13 LAB — ETHANOL: Alcohol, Ethyl (B): 158 mg/dL — ABNORMAL HIGH (ref 0–11)

## 2014-05-13 LAB — CBC WITH DIFFERENTIAL/PLATELET
BASOS ABS: 0 10*3/uL (ref 0.0–0.1)
Basophils Relative: 1 % (ref 0–1)
EOS ABS: 0.1 10*3/uL (ref 0.0–0.7)
EOS PCT: 2 % (ref 0–5)
HCT: 43.1 % (ref 39.0–52.0)
Hemoglobin: 15.1 g/dL (ref 13.0–17.0)
Lymphocytes Relative: 56 % — ABNORMAL HIGH (ref 12–46)
Lymphs Abs: 2.3 10*3/uL (ref 0.7–4.0)
MCH: 30.9 pg (ref 26.0–34.0)
MCHC: 35 g/dL (ref 30.0–36.0)
MCV: 88.3 fL (ref 78.0–100.0)
Monocytes Absolute: 0.3 10*3/uL (ref 0.1–1.0)
Monocytes Relative: 7 % (ref 3–12)
Neutro Abs: 1.4 10*3/uL — ABNORMAL LOW (ref 1.7–7.7)
Neutrophils Relative %: 34 % — ABNORMAL LOW (ref 43–77)
Platelets: 223 10*3/uL (ref 150–400)
RBC: 4.88 MIL/uL (ref 4.22–5.81)
RDW: 13 % (ref 11.5–15.5)
WBC: 4.2 10*3/uL (ref 4.0–10.5)

## 2014-05-13 MED ORDER — ONDANSETRON HCL 4 MG PO TABS: 4.0000 mg | ORAL_TABLET | Freq: Three times a day (TID) | ORAL | Status: DC | PRN

## 2014-05-13 MED ORDER — HYDROCHLOROTHIAZIDE 12.5 MG PO CAPS
12.5000 mg | ORAL_CAPSULE | Freq: Every day | ORAL | Status: DC
  Administered 2014-05-13: 12.5 mg via ORAL
  Filled 2014-05-13 (×3): qty 1

## 2014-05-13 MED ORDER — IBUPROFEN 600 MG PO TABS: 600.0000 mg | ORAL_TABLET | Freq: Three times a day (TID) | ORAL | Status: DC | PRN

## 2014-05-13 MED ORDER — ALUM & MAG HYDROXIDE-SIMETH 200-200-20 MG/5ML PO SUSP: 30.0000 mL | ORAL | Status: DC | PRN

## 2014-05-13 MED ORDER — NICOTINE 21 MG/24HR TD PT24
21.0000 mg | MEDICATED_PATCH | Freq: Every day | TRANSDERMAL | Status: DC
  Filled 2014-05-13 (×3): qty 1

## 2014-05-13 MED ORDER — LORAZEPAM 1 MG PO TABS: 0.0000 mg | ORAL_TABLET | Freq: Four times a day (QID) | ORAL | Status: DC

## 2014-05-13 MED ORDER — ACETAMINOPHEN 325 MG PO TABS: 650.0000 mg | ORAL_TABLET | ORAL | Status: DC | PRN

## 2014-05-13 MED ORDER — LORAZEPAM 1 MG PO TABS: 0.0000 mg | ORAL_TABLET | Freq: Two times a day (BID) | ORAL | Status: DC

## 2014-05-13 MED ORDER — NICOTINE 21 MG/24HR TD PT24
21.0000 mg | MEDICATED_PATCH | Freq: Every day | TRANSDERMAL | Status: DC
  Filled 2014-05-13: qty 1

## 2014-05-13 MED ORDER — CLONIDINE HCL 0.1 MG PO TABS
0.1000 mg | ORAL_TABLET | Freq: Three times a day (TID) | ORAL | Status: DC | PRN
  Administered 2014-05-13: 0.1 mg via ORAL
  Filled 2014-05-13: qty 1

## 2014-05-13 MED ORDER — VITAMIN B-1 100 MG PO TABS
100.0000 mg | ORAL_TABLET | Freq: Every day | ORAL | Status: DC
  Administered 2014-05-13: 100 mg via ORAL
  Filled 2014-05-13: qty 1

## 2014-05-13 MED ORDER — VITAMIN B-1 100 MG PO TABS
100.0000 mg | ORAL_TABLET | Freq: Every day | ORAL | Status: DC
  Administered 2014-05-14: 100 mg via ORAL
  Filled 2014-05-13 (×3): qty 1

## 2014-05-13 MED ORDER — CLONIDINE HCL 0.1 MG PO TABS
0.2000 mg | ORAL_TABLET | Freq: Once | ORAL | Status: AC
  Administered 2014-05-13: 0.2 mg via ORAL
  Filled 2014-05-13: qty 2

## 2014-05-13 MED ORDER — HYDROCHLOROTHIAZIDE 12.5 MG PO CAPS
12.5000 mg | ORAL_CAPSULE | Freq: Every day | ORAL | Status: DC
  Administered 2014-05-14: 12.5 mg via ORAL
  Filled 2014-05-13 (×3): qty 1

## 2014-05-13 MED ORDER — THIAMINE HCL 100 MG/ML IJ SOLN: 100.0000 mg | Freq: Every day | INTRAMUSCULAR | Status: DC

## 2014-05-13 MED ORDER — IBUPROFEN 200 MG PO TABS: 600.0000 mg | ORAL_TABLET | Freq: Three times a day (TID) | ORAL | Status: DC | PRN

## 2014-05-13 NOTE — ED Notes (Signed)
Rn attempted to call and give report to [phone] and [phone], no answer. Will f/u and continue to monitor.

## 2014-05-13 NOTE — ED Notes (Addendum)
Patient states that after he eats breakfast he will attempt to provide a urine sample

## 2014-05-13 NOTE — ED Notes (Signed)
[facility]  Rn attempted to call and give report, was placed on hold for approx 5mins with no response.

## 2014-05-13 NOTE — Progress Notes (Signed)
Patient ID: [name], male   DOB: 1966/02/03, 48 y.o.   MRN: [id number] [facility] INPATIENT:  Family/Significant Other Suicide Prevention Education  Suicide Prevention Education:  Patient Refusal for Family/Significant Other Suicide Prevention Education: The patient [name] has refused to provide written consent for family/significant other to be provided Family/Significant Other Suicide Prevention Education during admission and/or prior to discharge.  Physician notified.  Loren Racer 05/13/2014, 3:34 PM

## 2014-05-13 NOTE — Plan of Care (Signed)
[facility] Observation Crisis Plan  Reason for Crisis Plan:  Substance Abuse   Plan of Care:  Referral for Substance Abuse  Family Support:      Current Living Environment:  Living Arrangements: Other (Comment) (homeless)  Insurance:   Hospital Account   Name Acct ID Class Status Primary Coverage   [name], [name] [id number] BEHAVIORAL HEALTH OBSERVATION Open MEDICARE - MEDICARE PART A AND B        Guarantor Account (for Hospital Account [id number])   Name Relation to Pt Service Area Active? Acct Type   [name], [name] [facility] Yes Behavioral Health   Address Phone       [address] [place], [place] [address] [phone])          Coverage Information (for Hospital Account [id number])   1. MEDICARE/MEDICARE PART A AND B   F/O Payor/Plan Precert #   MEDICARE/MEDICARE PART A AND B    Subscriber Subscriber #   [name], [name] [id number]   Address Phone   [address] [place],  [phone]        2. [place]   F/O Payor/Plan Precert #   [facility]/[place]    Subscriber Subscriber #   [name], [name] [id number]   Address Phone   [address] [place], [place] [address] [phone]          Legal Guardian:     Primary Care Provider:  No PCP Per Patient  Current Outpatient Providers:  [facility]  Psychiatrist:     Counselor/Therapist:     Compliant with Medications:  No  Additional Information:   [name], [name] 8/22/20153:18 PM

## 2014-05-13 NOTE — Progress Notes (Signed)
Patient was met awake and alert. Flat affect but cheerful upon approach. Patient denied pain, SI, AH/VH. Patient stated " I am fine nurse; all I want is a place that can help me, a rehab". Patient reports no withdrawal symptoms. Patient reassured. Support and encouragement offered. Will continue to monitor patient.

## 2014-05-13 NOTE — H&P (Signed)
Case discussed with NP. Chart reviewed. Agree with above assessment and plan. Will observe pt overnight, and plan discharge in AM.  Ancil LinseySARANGA, Danel Requena, MD

## 2014-05-13 NOTE — Progress Notes (Signed)
Patient ID: Mindi CurlingCarl Xxxbenton, male   DOB: [birth year], 48 y.o.   MRN: [id number] Patient admitted to obs unit.  Hx of etoh and cocaine use.  Last use last PM.  No signs sxs of withdrawal. No pain. Patient reports 30 lb weight loss. States he is depressed.  DEnies SI, HI, AVH. Has conversed with various people on the phone since admission. Pacing in unit as he converses. Animated at times.  Conversations regarding desire to be in 30 day rehab program.Oriented to unit. Nutrition offered. Education provided regarding safety and falls. Safety checks started every 15 minutes.

## 2014-05-13 NOTE — ED Notes (Signed)
Pt reports last use cocaine and ETOH at 0100. Pt reports two fifth and twelve pack throughout the day. Pt denies symptoms at present time and reports will alert staff for concerns.

## 2014-05-13 NOTE — ED Notes (Signed)
Pt reports will attempt to void post breakfast.
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
## 2014-05-13 NOTE — ED Notes (Signed)
Phone call complete to Inetta Fermoina, RN [facility]. Made aware that RN has been trying to give report for pt x404mins. Provided Ms Inetta Fermoina with RNs contact number and reported that she would have receiving RN f/u to get report.  Rn will f/u and continue to monitor.

## 2014-05-13 NOTE — ED Notes (Signed)
Pt. Stated that he couldn't urinate at this time. Nurses was notified.

## 2014-05-13 NOTE — ED Notes (Signed)
Phone call completed to Dr. Micheline Mazeocherty, made aware of [facility].  Ordered pt clonidine.
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
## 2014-05-13 NOTE — ED Notes (Signed)
Pt presents with request for assistance with detox from etoh and cocaine. Pt denies SI/HI
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
## 2014-05-13 NOTE — ED Notes (Signed)
[facility]  Janann ColonelGregory Pickett Jr. MSW, LCSW Therapeutic Triage Services-Triage Specialist   Phone: [phone] Fax: [phone]

## 2014-05-13 NOTE — ED Notes (Signed)
Per face to face with Dr Micheline Mazeocherty, unsure of plan of care for pt at this time. An update has not been provided to RN or Dr.  Theola Sequinn will continue to f/u and monitor.

## 2014-05-13 NOTE — Progress Notes (Deleted)
Report given to RN on adult unit.  Pt is in no acute distress at this time.  Pt transferred to adult unit.   
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
## 2014-05-14 DIAGNOSIS — F1020 Alcohol dependence, uncomplicated: Secondary | ICD-10-CM | POA: Diagnosis not present

## 2014-05-14 NOTE — Progress Notes (Signed)
Patient ID: [name], male   DOB: 09-28-1965, 48 y.o.   MRN: [id number] 05-14-2014 nursing obs note: D: pt was awakened this am for am medications. He ate his breakfast. She is not having any si/hi/av. A: when staff interact with pt he states he is wanting a 28 day program or a residential program for substance abuse/dependence. rn worked with this pt in the obs area about 2 wks ago. He basic situation has not changed. R: this pt is not in any acute distress and staff will continue to monitor him in the obs area. Staff and pt await his disposition. Please refer to note on 05-13-14 at 1728.

## 2014-05-14 NOTE — Discharge Summary (Signed)
  Patient is an AA male 48 years old who was admitted for alcohol dependence and treatment.  Patient was recently discharged from our inpatient Chemical dependency unit for alcohol treatment.  He was supposed to start a 28 days rehabilitation treatment at [facility].  He was denied admission there because of a previous behavior issue last year when he was there.  He was asked to check back with the facility on September to see if they have a bed for him.  Patient came here to avoid relapsing on alcohol.  He is calm and cooperative.  He denied alcohol withdrawal symptoms.  Patient denied SI/HI/AVH and will be discharged home.  Patient states he will stay with a close friend until he will be able to go back to [facility].  Dahlia Byes    PMHNP-BC
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
## 2014-05-14 NOTE — Discharge Summary (Signed)
Case discussed with NP. Chart reviewed. Concur with above assessment and plan.  Kasra Melvin, MD 

## 2014-05-14 NOTE — Progress Notes (Signed)
Patient ID: [name], male   DOB: 1966-02-16, 48 y.o.   MRN: [id number] Patient discharged.  To follow up with [facility].  No signs of withdrawal. Denies SI, HI, AVH.  Discharged to lobby.

## 2014-05-14 NOTE — H&P (Signed)
Case discussed with NP. Chart reviewed. Concur with above assessment and plan. Will discharge pt, with follow up at [facility].  Ancil Linsey, MD
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
## 2014-07-10 ENCOUNTER — Encounter (HOSPITAL_COMMUNITY): Payer: Self-pay | Admitting: Emergency Medicine

## 2014-07-10 ENCOUNTER — Emergency Department (HOSPITAL_COMMUNITY)
Admission: EM | Admit: 2014-07-10 | Discharge: 2014-07-10 | Disposition: A | Discharge status: Home / Self Care | Payer: Medicare Other | Attending: Emergency Medicine | Admitting: Emergency Medicine

## 2014-07-10 DIAGNOSIS — I10 Essential (primary) hypertension: Secondary | ICD-10-CM | POA: Diagnosis not present

## 2014-07-10 DIAGNOSIS — Z720 Tobacco use: Secondary | ICD-10-CM | POA: Insufficient documentation

## 2014-07-10 DIAGNOSIS — M545 Low back pain, unspecified: Secondary | ICD-10-CM

## 2014-07-10 DIAGNOSIS — Z87828 Personal history of other (healed) physical injury and trauma: Secondary | ICD-10-CM | POA: Diagnosis not present

## 2014-07-10 DIAGNOSIS — G8929 Other chronic pain: Secondary | ICD-10-CM | POA: Diagnosis not present

## 2014-07-10 DIAGNOSIS — Z8659 Personal history of other mental and behavioral disorders: Secondary | ICD-10-CM | POA: Diagnosis not present

## 2014-07-10 MED ORDER — OXYCODONE-ACETAMINOPHEN 5-325 MG PO TABS
2.0000 | ORAL_TABLET | Freq: Once | ORAL | Status: AC
  Administered 2014-07-10: 2 via ORAL
  Filled 2014-07-10: qty 2
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
## 2014-07-10 NOTE — Discharge Instructions (Signed)
Chronic Back Pain ° When back pain lasts longer than 3 months, it is called chronic back pain. People with chronic back pain often go through certain periods that are more intense (flare-ups).  °CAUSES °Chronic back pain can be caused by wear and tear (degeneration) on different structures in your back. These structures include: °· The bones of your spine (vertebrae) and the joints surrounding your spinal cord and nerve roots (facets). °· The strong, fibrous tissues that connect your vertebrae (ligaments). °Degeneration of these structures may result in pressure on your nerves. This can lead to constant pain. °HOME CARE INSTRUCTIONS °· Avoid bending, heavy lifting, prolonged sitting, and activities which make the problem worse. °· Take brief periods of rest throughout the day to reduce your pain. Lying down or standing usually is better than sitting while you are resting. °· Take over-the-counter or prescription medicines only as directed by your caregiver. °SEEK IMMEDIATE MEDICAL CARE IF:  °· You have weakness or numbness in one of your legs or feet. °· You have trouble controlling your bladder or bowels. °· You have nausea, vomiting, abdominal pain, shortness of breath, or fainting. °Document Released: 10/16/2004 Document Revised: 12/01/2011 Document Reviewed: 08/23/2011 °ExitCare® Patient Information ©2015 [employer]. This information is not intended to replace advice given to you by your health care provider. Make sure you discuss any questions you have with your health care provider. ° ° °Emergency Department Resource Guide °1) Find a Doctor and Pay Out of Pocket °Although you won't have to find out who is covered by your insurance plan, it is a good idea to ask around and get recommendations. You will then need to call the office and see if the doctor you have chosen will accept you as a new patient and what types of options they offer for patients who are self-pay. Some doctors offer discounts or will set  up payment plans for their patients who do not have insurance, but you will need to ask so you aren't surprised when you get to your appointment. ° °2) Contact Your Local Health Department °Not all health departments have doctors that can see patients for sick visits, but many do, so it is worth a call to see if yours does. If you don't know where your local health department is, you can check in your phone book. The CDC also has a tool to help you locate your state's health department, and many state websites also have listings of all of their local health departments. ° °3) Find a Walk-in Clinic °If your illness is not likely to be very severe or complicated, you may want to try a walk in clinic. These are popping up all over the country in pharmacies, drugstores, and shopping centers. They're usually staffed by nurse practitioners or physician assistants that have been trained to treat common illnesses and complaints. They're usually fairly quick and inexpensive. However, if you have serious medical issues or chronic medical problems, these are probably not your best option. ° °No Primary Care Doctor: °- Call [facility] Connect at  832-8000 - they can help you locate a primary care doctor that  accepts your insurance, provides certain services, etc. °- Physician Referral Service- 1-800-533-3463 ° °Chronic Pain Problems: °Organization         Address  Phone   Notes  °[facility]  (336) 297-2271 Patients need to be referred by their primary care doctor.  ° °Medication Assistance: °Organization         Address    Phone   Notes  °[place] [facility] [address] °[place], [place] [address] (336) 641-8030 --Must be a resident of [place] °-- Must have NO insurance coverage whatsoever (no Medicaid/ Medicare, etc.) °-- The pt. MUST have a primary care doctor that directs their care regularly and follows them in the community °  °[facility]  (866) 331-1348    °[employer] Way  (888) 892-1162   ° °Agencies that provide inexpensive medical care: °Organization         Address  Phone   Notes  °[facility]  (336) 832-8035   °[facility]    (336) 832-7272   °[facility] [address] °[place], [place] [address] (336) 832-4777   °[facility] [address], °[place] (336) 271-4999   °[facility]    (336) 373-0678   °[facility]    (336) 272-1050   °[facility] ° [address], [place] Phone:  (336) 832-4444, Fax:  (336) 832-4440 Hours of Operation:  9 am - 6 pm, M-F.  Also accepts Medicaid/Medicare and self-pay.  ° [facility] ° [address], [place] Phone: (336) 832-3150, Fax: (336) 832-3151. Hours of Operation:  8:30 am - 5:30 pm, M-F.  Also accepts Medicaid and self-pay.  °[facility] [place] [address], [place] Phone: (336) 878-6027   °[facility] [address], [place], [place] (336)723-1848, Ext. 123 Mondays & Thursdays: 7-9 AM.  First 15 patients are seen on a first come, first serve basis. °  ° °Medicaid-accepting [place] Providers: ° °Organization         Address  Phone   Notes  °[facility] [address], [place] (336) 641-2100 Also accepts self-pay patients.  °[facility] [address], [place] ° (336) 856-9996   °[facility] [address], [place] (336) 288-8857   °[facility] [address] [place] [address], [place] (336) 299-7000   °Veita Bland [address], [place]  ° (336) 373-1557 Only accepts [place] Access Medicaid patients after they have their name applied to their card.  ° °Self-Pay (no insurance) in [place]: ° °Organization         Address  Phone   Notes  °Sickle Cell Patients, [facility] [address],  [place] (336) 832-1970   °[facility] [address], [place] (336) 832-4400   ° [facility] ° [address], [place] (336) 992-4800   °[facility]/Dr. Osei-Bonsu ° [address] [place] [address], [place] or [address], [place] (336) 841-8500 Phone number for both [place] and [place] locations is the same.  °[facility] [address], [place] (336) 299-0000   °[facility] [address] [place] [address], [place] or [address] (336) 852-7530 °(336) 878-2260   °[facility] [address], [place] (336) 350-1642, phone; (336) 294-5005, fax Sees patients 1st and 3rd Saturday of every month.  Must not qualify for public or private insurance (i.e. Medicaid, Medicare, [place] Health Choice, Veterans' Benefits) • Household income should be no more than 200% of the poverty level •The clinic cannot treat you if you are pregnant or think you are pregnant •   Sexually transmitted diseases are not treated at the clinic.  ° ° °Dental Care: °Organization         Address  Phone  Notes  °[place] [facility] [address], [place] (336) 641-6152 Accepts children up to age 21 who are enrolled in Medicaid or [place] Health Choice; pregnant women with a Medicaid card; and children who have applied for Medicaid or [place] Health Choice, but were declined, whose parents can pay a reduced fee at time of service.  °[facility] [place]  [address], [place] (336) 641-7733 Accepts children up to age 21 who are enrolled in Medicaid or [place] Health Choice; pregnant women with a Medicaid card; and children who have applied for Medicaid or [place] Health Choice, but were declined, whose parents can pay a reduced fee at time of service.  °[facility] ° [address], [place] (336)  641-4533 Patients are seen by appointment only. Walk-ins are not accepted. [facility] will see patients 18 years of age and older. °Monday - Tuesday (8am-5pm) °Most Wednesdays (8:30-5pm) °$30 per visit, cash only  °[facility] ° [address], [place] (336) 641-4533 Patients are seen by appointment only. Walk-ins are not accepted. [facility] will see patients 18 years of age and older. °One Wednesday Evening (Monthly: Volunteer Based).  $30 per visit, cash only  °[facility]  (919) 537-3737 for adults; Children under age 4, call Graduate Pediatric Dentistry at (919) [phone]. Children aged 4-14, please call (919) [phone] to request a pediatric application. ° Dental services are provided in all areas of dental care including fillings, crowns and bridges, complete and partial dentures, implants, gum treatment, root canals, and extractions. Preventive care is also provided. Treatment is provided to both adults and children. °Patients are selected via a lottery and there is often a waiting list. °  °[facility] [address], °[place] ° (336) 763-8833 [email or web address] °  °[facility] [address], [place], [place] (336)723-1848, Ext. 123 Second and Fourth Thursday of each month, opens at 6:30 AM; Clinic ends at 9 AM.  Patients are seen on a first-come first-served basis, and a limited number are seen during each clinic.  ° °[facility] ° [address], [place], [place] (336) 723-7904   Eligibility Requirements °You must have lived in [place], [place], or [place] counties for at least the last three months. °  You cannot be eligible for state or federal sponsored healthcare insurance, including Veterans Administration, Medicaid, or Medicare. °  You generally cannot be eligible for healthcare insurance through your employer.  °  How to apply: °Eligibility screenings are held every Tuesday and Wednesday afternoon  from 1:00 pm until 4:00 pm. You do not need an appointment for the interview!  °[facility] [address], [place], [place] 336-631-2330   °[facility]  336-342-8273   °[facility]  336-703-3100   °[facility]  336-570-6415   ° °Behavioral Health Resources in the Community: °Intensive Outpatient Programs °Organization         Address  Phone  Notes  °[facility] [address], [place], [place] 336-878-6098   °[facility] [address], [place], [place] 336-832-9800   °[facility] [address], , [place] °   336-882-2125   °[facility] [address],  °[place], [place] 1-800-853-5163 or 336-641-4981   °Substance Abuse Resources °Organization         Address  Phone  Notes  °[facility]  336-882-2125   °[facility]  336-784-9470   °[facility]  336-285-9073   °[facility]  336-845-3988   °Residential & Outpatient Substance Abuse Program  1-800-659-3381   °Psychological Services °Organization         Address  Phone  Notes  °[facility]  336- 832-9600   °[facility]  336- 378-7881   °[facility] [address], [place] 1-800-853-5163 or 336-641-4981   ° °Mobile Crisis Teams °Organization         Address  Phone  Notes  °[facility], Mobile Crisis Care Unit  1-877-626-1772   °Assertive °[facility] ° [address] [place], [place] 336-834-9664   °Sharon DeEsch [address] °[place] 336-554-5454   ° °Self-Help/Support Groups °Organization         Address  Phone             Notes  °[facility] - variety of support groups  336- 373-1402 Call for more information  °Narcotics Anonymous (NA), [facility] [address], °[place]  2 meetings at this location  ° °Residential Treatment  Programs °Organization         Address  Phone  Notes  °[facility] [address],    °[place]  1-866-801-8205   °[facility] ° [address], [place], [place] 704-293-8524   °[facility] [address], [place] 336-845-3988 Admissions: 8am-3pm M-F  °[facility] [address]    °[place], [place] 336-841-1104   °[facility] [address], [place], [place] 336-379-7146   °[facility] [address]  °[place], [place] 336-285-9073   °[facility] - Intensive Outpatient [address], [place], [place] 336-852-3033   °[facility] [address]  °[place], [place] 1-877-615-2722 or 336-784-9470   °[facility] [address] [place], [place] 336-227-7417 Accepts Medicaid  °[facility] [address]  °[place] 1-800-659-3381 Substance Abuse/Addiction Treatment  ° °[place] Behavioral Health Resources °Organization         Address  Phone  Notes  °[facility]  (888) 581-9988   °Julie Brannon, PhD [address] [place], [place]   (336) 349-5553 or (336) 951-0000   °[facility]   [address] °[place], [place] (336) 349-4454   °[facility] [address], [place], [place] (336) 342-8316 Insurance/Medicaid/sponsorship through [facility]  °[facility] [address]                                    [place], [place] (336) 342-8316 Therapy/tele-psych/case  °[facility] [address]  ° [place],  (336) 349-2233    °Dr. Arfeen  (336) 349-4544   °[facility] [place]  [employer] [place] [facility] 1) [address], [place] °2) [address], [place] °3)  [address], [place] (336) 349-3220 °(336) 342-7768 ° °(336) 342-8140   °[place] Child [facility] (336) 342-1394 or (336) 342-3537 (  After Hours)    ° ° ° °

## 2014-07-10 NOTE — ED Notes (Signed)
Pt c/o lower back pain that is chronic in nature with radiation down left leg; pt sts has implant in back

## 2014-07-11 ENCOUNTER — Emergency Department (INDEPENDENT_AMBULATORY_CARE_PROVIDER_SITE_OTHER)
Admission: EM | Admit: 2014-07-11 | Discharge: 2014-07-11 | Disposition: A | Discharge status: Home / Self Care | Payer: Medicare Other | Source: Home / Self Care

## 2014-07-11 ENCOUNTER — Ambulatory Visit (INDEPENDENT_AMBULATORY_CARE_PROVIDER_SITE_OTHER): Payer: Medicare Other | Admitting: Family Medicine

## 2014-07-11 ENCOUNTER — Encounter (HOSPITAL_COMMUNITY): Payer: Self-pay | Admitting: Emergency Medicine

## 2014-07-11 VITALS — BP 132/95 | HR 75 | Temp 98.5°F | Resp 16 | Ht 75.0 in | Wt 225.0 lb

## 2014-07-11 DIAGNOSIS — M549 Dorsalgia, unspecified: Secondary | ICD-10-CM

## 2014-07-11 DIAGNOSIS — G8929 Other chronic pain: Secondary | ICD-10-CM

## 2014-07-11 DIAGNOSIS — I10 Essential (primary) hypertension: Secondary | ICD-10-CM

## 2014-07-11 DIAGNOSIS — G894 Chronic pain syndrome: Secondary | ICD-10-CM

## 2014-07-11 MED ORDER — MELOXICAM 7.5 MG PO TABS: 7.5000 mg | ORAL_TABLET | Freq: Every day | ORAL | Status: DC

## 2014-07-11 MED ORDER — LISINOPRIL 10 MG PO TABS: 10.0000 mg | ORAL_TABLET | Freq: Every day | ORAL | Status: DC
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
## 2014-07-11 NOTE — ED Provider Notes (Signed)
Medical screening examination/treatment/procedure(s) were performed by non-physician practitioner and as supervising physician I was immediately available for consultation/collaboration.   EKG Interpretation None        Samuel JesterKathleen Laureen Frederic, DO 07/11/14 2312

## 2014-07-11 NOTE — ED Notes (Addendum)
Pt  Reports   [facility]   [facility]        Was       Given        Pain  Pills         And  Has  A   Nerve stimulator          That       He   States   Is      Working     He ambulates      To  Room  With a  Slow        Gait    He  Is  Not  Taking       His  meds
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
## 2014-07-11 NOTE — Patient Instructions (Signed)
We are going to start you on some mobic for your back pain.  This is an NSAID medication.  You can take it once a day.  However your blood pressure is already high and mobic could make it go higher.  Therefore I want to start you on some blood pressure medication called lisinopril as well. You take this once a day.  Please follow-up with your orthopedist and/ or your pain management doctor for your back.  Please follow-up in 2 weeks to check your blood pressure.    Please remember that we will not be prescribing any narcotics such as vicodin, percocet, or any other controlled substance.  We are glad to help you otherwise.

## 2014-07-11 NOTE — Discharge Instructions (Signed)
Call [facility] or your pain management doctor in [place] [facility] [place] [facility].

## 2014-08-09 ENCOUNTER — Other Ambulatory Visit: Payer: Self-pay

## 2014-08-09 DIAGNOSIS — I10 Essential (primary) hypertension: Secondary | ICD-10-CM

## 2014-08-09 DIAGNOSIS — G8929 Other chronic pain: Secondary | ICD-10-CM

## 2014-08-09 DIAGNOSIS — M549 Dorsalgia, unspecified: Principal | ICD-10-CM

## 2014-08-09 MED ORDER — LISINOPRIL 10 MG PO TABS: 10.0000 mg | ORAL_TABLET | Freq: Every day | ORAL | Status: DC

## 2014-08-09 MED ORDER — MELOXICAM 7.5 MG PO TABS: 7.5000 mg | ORAL_TABLET | Freq: Every day | ORAL | Status: DC

## 2014-09-19 ENCOUNTER — Emergency Department (HOSPITAL_BASED_OUTPATIENT_CLINIC_OR_DEPARTMENT_OTHER)
Admission: EM | Admit: 2014-09-19 | Discharge: 2014-09-19 | Disposition: A | Discharge status: Home / Self Care | Payer: Medicare Other | Attending: Emergency Medicine | Admitting: Emergency Medicine

## 2014-09-19 ENCOUNTER — Encounter (HOSPITAL_BASED_OUTPATIENT_CLINIC_OR_DEPARTMENT_OTHER): Payer: Self-pay

## 2014-09-19 DIAGNOSIS — L299 Pruritus, unspecified: Secondary | ICD-10-CM | POA: Diagnosis present

## 2014-09-19 DIAGNOSIS — I10 Essential (primary) hypertension: Secondary | ICD-10-CM | POA: Diagnosis not present

## 2014-09-19 DIAGNOSIS — R21 Rash and other nonspecific skin eruption: Secondary | ICD-10-CM | POA: Insufficient documentation

## 2014-09-19 DIAGNOSIS — Z79899 Other long term (current) drug therapy: Secondary | ICD-10-CM | POA: Insufficient documentation

## 2014-09-19 DIAGNOSIS — G894 Chronic pain syndrome: Secondary | ICD-10-CM | POA: Diagnosis not present

## 2014-09-19 DIAGNOSIS — Z720 Tobacco use: Secondary | ICD-10-CM | POA: Diagnosis not present

## 2014-09-19 DIAGNOSIS — Z7952 Long term (current) use of systemic steroids: Secondary | ICD-10-CM | POA: Diagnosis not present

## 2014-09-19 DIAGNOSIS — Z791 Long term (current) use of non-steroidal anti-inflammatories (NSAID): Secondary | ICD-10-CM | POA: Diagnosis not present

## 2014-09-19 HISTORY — DX: Alcohol abuse, uncomplicated: F10.10

## 2014-09-19 LAB — COMPREHENSIVE METABOLIC PANEL
ALT: 54 U/L — ABNORMAL HIGH (ref 0–53)
AST: 35 U/L (ref 0–37)
Albumin: 4.1 g/dL (ref 3.5–5.2)
Alkaline Phosphatase: 45 U/L (ref 39–117)
Anion gap: 7 (ref 5–15)
BUN: 11 mg/dL (ref 6–23)
CALCIUM: 8.9 mg/dL (ref 8.4–10.5)
CO2: 21 mmol/L (ref 19–32)
CREATININE: 1.06 mg/dL (ref 0.50–1.35)
Chloride: 108 mEq/L (ref 96–112)
GFR calc Af Amer: >90 mL/min (ref >90)
GFR, EST NON AFRICAN AMERICAN: 81 mL/min — AB (ref >90)
Glucose, Bld: 90 mg/dL (ref 70–99)
POTASSIUM: 4 mmol/L (ref 3.5–5.1)
Sodium: 136 mmol/L (ref 135–145)
Total Bilirubin: 0.3 mg/dL (ref 0.3–1.2)
Total Protein: 7 g/dL (ref 6.0–8.3)

## 2014-09-19 LAB — CBC
HCT: 40.2 % (ref 39.0–52.0)
HEMOGLOBIN: 13.8 g/dL (ref 13.0–17.0)
MCH: 31 pg (ref 26.0–34.0)
MCHC: 34.3 g/dL (ref 30.0–36.0)
MCV: 90.3 fL (ref 78.0–100.0)
Platelets: 229 10*3/uL (ref 150–400)
RBC: 4.45 MIL/uL (ref 4.22–5.81)
RDW: 12.7 % (ref 11.5–15.5)
WBC: 4.8 10*3/uL (ref 4.0–10.5)

## 2014-09-19 MED ORDER — PREDNISONE 20 MG PO TABS: ORAL_TABLET | ORAL | Status: DC

## 2014-09-19 MED ORDER — HYDROXYZINE HCL 25 MG PO TABS: 25.0000 mg | ORAL_TABLET | Freq: Four times a day (QID) | ORAL | Status: DC | PRN

## 2014-09-19 MED ORDER — DEXAMETHASONE SODIUM PHOSPHATE 10 MG/ML IJ SOLN
10.0000 mg | Freq: Once | INTRAMUSCULAR | Status: AC
  Administered 2014-09-19: 10 mg via INTRAMUSCULAR
  Filled 2014-09-19: qty 1

## 2014-09-19 MED ORDER — FLUCONAZOLE 50 MG PO TABS
150.0000 mg | ORAL_TABLET | Freq: Once | ORAL | Status: AC
  Administered 2014-09-19: 150 mg via ORAL
  Filled 2014-09-19 (×2): qty 1

## 2014-09-19 MED ORDER — HYDROXYZINE HCL 25 MG PO TABS
25.0000 mg | ORAL_TABLET | Freq: Once | ORAL | Status: AC
  Administered 2014-09-19: 25 mg via ORAL
  Filled 2014-09-19: qty 1
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
## 2014-09-19 NOTE — Discharge Instructions (Signed)
Pruritus  °Pruritus is an itch. There are many different problems that can cause an itch. Dry skin is one of the most common causes of itching. Most cases of itching do not require medical attention.  °HOME CARE INSTRUCTIONS  °Make sure your skin is moistened on a regular basis. A moisturizer that contains petroleum jelly is best for keeping moisture in your skin. If you develop a rash, you may try the following for relief:  °· Use corticosteroid cream. °· Apply cool compresses to the affected areas. °· Bathe with Epsom salts or baking soda in the bathwater. °· Soak in colloidal oatmeal baths. These are available at your pharmacy. °· Apply baking soda paste to the rash. Stir water into baking soda until it reaches a paste-like consistency. °· Use an anti-itch lotion. °· Take over-the-counter diphenhydramine medicine by mouth as the instructions direct. °· Avoid scratching. Scratching may cause the rash to become infected. If itching is very bad, your caregiver may suggest prescription lotions or creams to lessen your symptoms. °· Avoid hot showers, which can make itching worse. A cold shower may help with itching as long as you use a moisturizer after the shower. °SEEK MEDICAL CARE IF: °The itching does not go away after several days. °Document Released: 05/21/2011 Document Revised: 01/23/2014 Document Reviewed: 05/21/2011 °ExitCare® Patient Information ©2015 [employer]. This information is not intended to replace advice given to you by your health care provider. Make sure you discuss any questions you have with your health care provider. ° °

## 2014-09-19 NOTE — ED Notes (Signed)
C/o itching "eberywhere" x 1 week-at [facility] x 11 days

## 2014-12-01 DIAGNOSIS — I10 Essential (primary) hypertension: Secondary | ICD-10-CM | POA: Diagnosis not present

## 2014-12-01 DIAGNOSIS — K219 Gastro-esophageal reflux disease without esophagitis: Secondary | ICD-10-CM | POA: Diagnosis not present

## 2014-12-01 DIAGNOSIS — M5430 Sciatica, unspecified side: Secondary | ICD-10-CM | POA: Diagnosis not present

## 2014-12-02 DIAGNOSIS — M5430 Sciatica, unspecified side: Secondary | ICD-10-CM | POA: Diagnosis not present

## 2014-12-02 DIAGNOSIS — K219 Gastro-esophageal reflux disease without esophagitis: Secondary | ICD-10-CM | POA: Diagnosis not present

## 2014-12-02 DIAGNOSIS — I10 Essential (primary) hypertension: Secondary | ICD-10-CM | POA: Diagnosis not present

## 2014-12-03 DIAGNOSIS — I10 Essential (primary) hypertension: Secondary | ICD-10-CM | POA: Diagnosis not present

## 2014-12-03 DIAGNOSIS — K219 Gastro-esophageal reflux disease without esophagitis: Secondary | ICD-10-CM | POA: Diagnosis not present

## 2014-12-03 DIAGNOSIS — M5430 Sciatica, unspecified side: Secondary | ICD-10-CM | POA: Diagnosis not present

## 2014-12-04 DIAGNOSIS — K219 Gastro-esophageal reflux disease without esophagitis: Secondary | ICD-10-CM | POA: Diagnosis not present

## 2014-12-04 DIAGNOSIS — I10 Essential (primary) hypertension: Secondary | ICD-10-CM | POA: Diagnosis not present

## 2014-12-04 DIAGNOSIS — M5430 Sciatica, unspecified side: Secondary | ICD-10-CM | POA: Diagnosis not present

## 2014-12-05 DIAGNOSIS — I10 Essential (primary) hypertension: Secondary | ICD-10-CM | POA: Diagnosis not present

## 2014-12-05 DIAGNOSIS — K219 Gastro-esophageal reflux disease without esophagitis: Secondary | ICD-10-CM | POA: Diagnosis not present

## 2014-12-05 DIAGNOSIS — M5430 Sciatica, unspecified side: Secondary | ICD-10-CM | POA: Diagnosis not present

## 2014-12-06 DIAGNOSIS — M5430 Sciatica, unspecified side: Secondary | ICD-10-CM | POA: Diagnosis not present

## 2014-12-06 DIAGNOSIS — I10 Essential (primary) hypertension: Secondary | ICD-10-CM | POA: Diagnosis not present

## 2014-12-06 DIAGNOSIS — K219 Gastro-esophageal reflux disease without esophagitis: Secondary | ICD-10-CM | POA: Diagnosis not present

## 2014-12-07 DIAGNOSIS — K219 Gastro-esophageal reflux disease without esophagitis: Secondary | ICD-10-CM | POA: Diagnosis not present

## 2014-12-07 DIAGNOSIS — I10 Essential (primary) hypertension: Secondary | ICD-10-CM | POA: Diagnosis not present

## 2014-12-07 DIAGNOSIS — M5430 Sciatica, unspecified side: Secondary | ICD-10-CM | POA: Diagnosis not present

## 2014-12-08 DIAGNOSIS — I10 Essential (primary) hypertension: Secondary | ICD-10-CM | POA: Diagnosis not present

## 2014-12-08 DIAGNOSIS — M5430 Sciatica, unspecified side: Secondary | ICD-10-CM | POA: Diagnosis not present

## 2014-12-08 DIAGNOSIS — K219 Gastro-esophageal reflux disease without esophagitis: Secondary | ICD-10-CM | POA: Diagnosis not present

## 2014-12-09 DIAGNOSIS — I10 Essential (primary) hypertension: Secondary | ICD-10-CM | POA: Diagnosis not present

## 2014-12-09 DIAGNOSIS — M5430 Sciatica, unspecified side: Secondary | ICD-10-CM | POA: Diagnosis not present

## 2014-12-09 DIAGNOSIS — K219 Gastro-esophageal reflux disease without esophagitis: Secondary | ICD-10-CM | POA: Diagnosis not present

## 2014-12-10 DIAGNOSIS — K219 Gastro-esophageal reflux disease without esophagitis: Secondary | ICD-10-CM | POA: Diagnosis not present

## 2014-12-10 DIAGNOSIS — M5430 Sciatica, unspecified side: Secondary | ICD-10-CM | POA: Diagnosis not present

## 2014-12-10 DIAGNOSIS — I10 Essential (primary) hypertension: Secondary | ICD-10-CM | POA: Diagnosis not present

## 2014-12-11 DIAGNOSIS — K219 Gastro-esophageal reflux disease without esophagitis: Secondary | ICD-10-CM | POA: Diagnosis not present

## 2014-12-11 DIAGNOSIS — M5430 Sciatica, unspecified side: Secondary | ICD-10-CM | POA: Diagnosis not present

## 2014-12-11 DIAGNOSIS — I10 Essential (primary) hypertension: Secondary | ICD-10-CM | POA: Diagnosis not present

## 2014-12-12 DIAGNOSIS — I10 Essential (primary) hypertension: Secondary | ICD-10-CM | POA: Diagnosis not present

## 2014-12-12 DIAGNOSIS — K219 Gastro-esophageal reflux disease without esophagitis: Secondary | ICD-10-CM | POA: Diagnosis not present

## 2014-12-12 DIAGNOSIS — M5430 Sciatica, unspecified side: Secondary | ICD-10-CM | POA: Diagnosis not present

## 2014-12-13 DIAGNOSIS — K219 Gastro-esophageal reflux disease without esophagitis: Secondary | ICD-10-CM | POA: Diagnosis not present

## 2014-12-13 DIAGNOSIS — M5430 Sciatica, unspecified side: Secondary | ICD-10-CM | POA: Diagnosis not present

## 2014-12-13 DIAGNOSIS — I10 Essential (primary) hypertension: Secondary | ICD-10-CM | POA: Diagnosis not present

## 2014-12-14 DIAGNOSIS — K219 Gastro-esophageal reflux disease without esophagitis: Secondary | ICD-10-CM | POA: Diagnosis not present

## 2014-12-14 DIAGNOSIS — M5430 Sciatica, unspecified side: Secondary | ICD-10-CM | POA: Diagnosis not present

## 2014-12-14 DIAGNOSIS — I10 Essential (primary) hypertension: Secondary | ICD-10-CM | POA: Diagnosis not present

## 2014-12-16 DIAGNOSIS — M5430 Sciatica, unspecified side: Secondary | ICD-10-CM | POA: Diagnosis not present

## 2014-12-16 DIAGNOSIS — K219 Gastro-esophageal reflux disease without esophagitis: Secondary | ICD-10-CM | POA: Diagnosis not present

## 2014-12-16 DIAGNOSIS — I10 Essential (primary) hypertension: Secondary | ICD-10-CM | POA: Diagnosis not present

## 2014-12-17 DIAGNOSIS — M5430 Sciatica, unspecified side: Secondary | ICD-10-CM | POA: Diagnosis not present

## 2014-12-17 DIAGNOSIS — I10 Essential (primary) hypertension: Secondary | ICD-10-CM | POA: Diagnosis not present

## 2014-12-17 DIAGNOSIS — K219 Gastro-esophageal reflux disease without esophagitis: Secondary | ICD-10-CM | POA: Diagnosis not present

## 2014-12-18 DIAGNOSIS — K219 Gastro-esophageal reflux disease without esophagitis: Secondary | ICD-10-CM | POA: Diagnosis not present

## 2014-12-18 DIAGNOSIS — M5430 Sciatica, unspecified side: Secondary | ICD-10-CM | POA: Diagnosis not present

## 2014-12-18 DIAGNOSIS — I10 Essential (primary) hypertension: Secondary | ICD-10-CM | POA: Diagnosis not present

## 2014-12-19 DIAGNOSIS — M5430 Sciatica, unspecified side: Secondary | ICD-10-CM | POA: Diagnosis not present

## 2014-12-19 DIAGNOSIS — K219 Gastro-esophageal reflux disease without esophagitis: Secondary | ICD-10-CM | POA: Diagnosis not present

## 2014-12-19 DIAGNOSIS — I10 Essential (primary) hypertension: Secondary | ICD-10-CM | POA: Diagnosis not present

## 2014-12-20 DIAGNOSIS — M5430 Sciatica, unspecified side: Secondary | ICD-10-CM | POA: Diagnosis not present

## 2014-12-20 DIAGNOSIS — K219 Gastro-esophageal reflux disease without esophagitis: Secondary | ICD-10-CM | POA: Diagnosis not present

## 2014-12-20 DIAGNOSIS — I10 Essential (primary) hypertension: Secondary | ICD-10-CM | POA: Diagnosis not present

## 2014-12-21 DIAGNOSIS — I10 Essential (primary) hypertension: Secondary | ICD-10-CM | POA: Diagnosis not present

## 2014-12-21 DIAGNOSIS — M5430 Sciatica, unspecified side: Secondary | ICD-10-CM | POA: Diagnosis not present

## 2014-12-21 DIAGNOSIS — K219 Gastro-esophageal reflux disease without esophagitis: Secondary | ICD-10-CM | POA: Diagnosis not present

## 2014-12-22 DIAGNOSIS — K219 Gastro-esophageal reflux disease without esophagitis: Secondary | ICD-10-CM | POA: Diagnosis not present

## 2014-12-22 DIAGNOSIS — M5430 Sciatica, unspecified side: Secondary | ICD-10-CM | POA: Diagnosis not present

## 2014-12-22 DIAGNOSIS — I10 Essential (primary) hypertension: Secondary | ICD-10-CM | POA: Diagnosis not present

## 2014-12-23 DIAGNOSIS — I10 Essential (primary) hypertension: Secondary | ICD-10-CM | POA: Diagnosis not present

## 2014-12-23 DIAGNOSIS — M5430 Sciatica, unspecified side: Secondary | ICD-10-CM | POA: Diagnosis not present

## 2014-12-23 DIAGNOSIS — K219 Gastro-esophageal reflux disease without esophagitis: Secondary | ICD-10-CM | POA: Diagnosis not present

## 2014-12-24 DIAGNOSIS — M5430 Sciatica, unspecified side: Secondary | ICD-10-CM | POA: Diagnosis not present

## 2014-12-24 DIAGNOSIS — I10 Essential (primary) hypertension: Secondary | ICD-10-CM | POA: Diagnosis not present

## 2014-12-24 DIAGNOSIS — K219 Gastro-esophageal reflux disease without esophagitis: Secondary | ICD-10-CM | POA: Diagnosis not present

## 2014-12-25 DIAGNOSIS — K219 Gastro-esophageal reflux disease without esophagitis: Secondary | ICD-10-CM | POA: Diagnosis not present

## 2014-12-25 DIAGNOSIS — M5430 Sciatica, unspecified side: Secondary | ICD-10-CM | POA: Diagnosis not present

## 2014-12-25 DIAGNOSIS — I10 Essential (primary) hypertension: Secondary | ICD-10-CM | POA: Diagnosis not present

## 2014-12-26 DIAGNOSIS — K219 Gastro-esophageal reflux disease without esophagitis: Secondary | ICD-10-CM | POA: Diagnosis not present

## 2014-12-26 DIAGNOSIS — I10 Essential (primary) hypertension: Secondary | ICD-10-CM | POA: Diagnosis not present

## 2014-12-26 DIAGNOSIS — M5430 Sciatica, unspecified side: Secondary | ICD-10-CM | POA: Diagnosis not present

## 2014-12-27 DIAGNOSIS — I10 Essential (primary) hypertension: Secondary | ICD-10-CM | POA: Diagnosis not present

## 2014-12-27 DIAGNOSIS — M5430 Sciatica, unspecified side: Secondary | ICD-10-CM | POA: Diagnosis not present

## 2014-12-27 DIAGNOSIS — K219 Gastro-esophageal reflux disease without esophagitis: Secondary | ICD-10-CM | POA: Diagnosis not present

## 2014-12-28 DIAGNOSIS — M5430 Sciatica, unspecified side: Secondary | ICD-10-CM | POA: Diagnosis not present

## 2014-12-28 DIAGNOSIS — K219 Gastro-esophageal reflux disease without esophagitis: Secondary | ICD-10-CM | POA: Diagnosis not present

## 2014-12-28 DIAGNOSIS — I10 Essential (primary) hypertension: Secondary | ICD-10-CM | POA: Diagnosis not present

## 2014-12-29 DIAGNOSIS — K219 Gastro-esophageal reflux disease without esophagitis: Secondary | ICD-10-CM | POA: Diagnosis not present

## 2014-12-29 DIAGNOSIS — I10 Essential (primary) hypertension: Secondary | ICD-10-CM | POA: Diagnosis not present

## 2014-12-29 DIAGNOSIS — M5430 Sciatica, unspecified side: Secondary | ICD-10-CM | POA: Diagnosis not present

## 2014-12-30 DIAGNOSIS — K219 Gastro-esophageal reflux disease without esophagitis: Secondary | ICD-10-CM | POA: Diagnosis not present

## 2014-12-30 DIAGNOSIS — M5430 Sciatica, unspecified side: Secondary | ICD-10-CM | POA: Diagnosis not present

## 2014-12-30 DIAGNOSIS — I10 Essential (primary) hypertension: Secondary | ICD-10-CM | POA: Diagnosis not present

## 2014-12-31 DIAGNOSIS — K219 Gastro-esophageal reflux disease without esophagitis: Secondary | ICD-10-CM | POA: Diagnosis not present

## 2014-12-31 DIAGNOSIS — I10 Essential (primary) hypertension: Secondary | ICD-10-CM | POA: Diagnosis not present

## 2014-12-31 DIAGNOSIS — M5430 Sciatica, unspecified side: Secondary | ICD-10-CM | POA: Diagnosis not present

## 2015-01-01 DIAGNOSIS — M5430 Sciatica, unspecified side: Secondary | ICD-10-CM | POA: Diagnosis not present

## 2015-01-01 DIAGNOSIS — I10 Essential (primary) hypertension: Secondary | ICD-10-CM | POA: Diagnosis not present

## 2015-01-01 DIAGNOSIS — K219 Gastro-esophageal reflux disease without esophagitis: Secondary | ICD-10-CM | POA: Diagnosis not present

## 2015-01-02 DIAGNOSIS — I10 Essential (primary) hypertension: Secondary | ICD-10-CM | POA: Diagnosis not present

## 2015-01-02 DIAGNOSIS — M5430 Sciatica, unspecified side: Secondary | ICD-10-CM | POA: Diagnosis not present

## 2015-01-02 DIAGNOSIS — K219 Gastro-esophageal reflux disease without esophagitis: Secondary | ICD-10-CM | POA: Diagnosis not present

## 2015-01-03 DIAGNOSIS — K219 Gastro-esophageal reflux disease without esophagitis: Secondary | ICD-10-CM | POA: Diagnosis not present

## 2015-01-03 DIAGNOSIS — M5430 Sciatica, unspecified side: Secondary | ICD-10-CM | POA: Diagnosis not present

## 2015-01-03 DIAGNOSIS — I10 Essential (primary) hypertension: Secondary | ICD-10-CM | POA: Diagnosis not present

## 2015-01-04 DIAGNOSIS — I10 Essential (primary) hypertension: Secondary | ICD-10-CM | POA: Diagnosis not present

## 2015-01-04 DIAGNOSIS — K219 Gastro-esophageal reflux disease without esophagitis: Secondary | ICD-10-CM | POA: Diagnosis not present

## 2015-01-04 DIAGNOSIS — M5430 Sciatica, unspecified side: Secondary | ICD-10-CM | POA: Diagnosis not present

## 2015-01-05 DIAGNOSIS — K219 Gastro-esophageal reflux disease without esophagitis: Secondary | ICD-10-CM | POA: Diagnosis not present

## 2015-01-05 DIAGNOSIS — M5430 Sciatica, unspecified side: Secondary | ICD-10-CM | POA: Diagnosis not present

## 2015-01-05 DIAGNOSIS — I10 Essential (primary) hypertension: Secondary | ICD-10-CM | POA: Diagnosis not present

## 2015-01-06 DIAGNOSIS — I10 Essential (primary) hypertension: Secondary | ICD-10-CM | POA: Diagnosis not present

## 2015-01-06 DIAGNOSIS — K219 Gastro-esophageal reflux disease without esophagitis: Secondary | ICD-10-CM | POA: Diagnosis not present

## 2015-01-06 DIAGNOSIS — M5430 Sciatica, unspecified side: Secondary | ICD-10-CM | POA: Diagnosis not present

## 2015-01-07 DIAGNOSIS — M5430 Sciatica, unspecified side: Secondary | ICD-10-CM | POA: Diagnosis not present

## 2015-01-07 DIAGNOSIS — K219 Gastro-esophageal reflux disease without esophagitis: Secondary | ICD-10-CM | POA: Diagnosis not present

## 2015-01-07 DIAGNOSIS — I10 Essential (primary) hypertension: Secondary | ICD-10-CM | POA: Diagnosis not present

## 2015-01-08 DIAGNOSIS — K219 Gastro-esophageal reflux disease without esophagitis: Secondary | ICD-10-CM | POA: Diagnosis not present

## 2015-01-08 DIAGNOSIS — I10 Essential (primary) hypertension: Secondary | ICD-10-CM | POA: Diagnosis not present

## 2015-01-08 DIAGNOSIS — M5430 Sciatica, unspecified side: Secondary | ICD-10-CM | POA: Diagnosis not present

## 2015-01-09 DIAGNOSIS — I10 Essential (primary) hypertension: Secondary | ICD-10-CM | POA: Diagnosis not present

## 2015-01-09 DIAGNOSIS — M5430 Sciatica, unspecified side: Secondary | ICD-10-CM | POA: Diagnosis not present

## 2015-01-09 DIAGNOSIS — K219 Gastro-esophageal reflux disease without esophagitis: Secondary | ICD-10-CM | POA: Diagnosis not present

## 2015-01-10 DIAGNOSIS — I10 Essential (primary) hypertension: Secondary | ICD-10-CM | POA: Diagnosis not present

## 2015-01-10 DIAGNOSIS — M5430 Sciatica, unspecified side: Secondary | ICD-10-CM | POA: Diagnosis not present

## 2015-01-10 DIAGNOSIS — K219 Gastro-esophageal reflux disease without esophagitis: Secondary | ICD-10-CM | POA: Diagnosis not present

## 2015-01-11 DIAGNOSIS — M5430 Sciatica, unspecified side: Secondary | ICD-10-CM | POA: Diagnosis not present

## 2015-01-11 DIAGNOSIS — K219 Gastro-esophageal reflux disease without esophagitis: Secondary | ICD-10-CM | POA: Diagnosis not present

## 2015-01-11 DIAGNOSIS — I10 Essential (primary) hypertension: Secondary | ICD-10-CM | POA: Diagnosis not present

## 2015-01-12 DIAGNOSIS — K219 Gastro-esophageal reflux disease without esophagitis: Secondary | ICD-10-CM | POA: Diagnosis not present

## 2015-01-12 DIAGNOSIS — M5430 Sciatica, unspecified side: Secondary | ICD-10-CM | POA: Diagnosis not present

## 2015-01-12 DIAGNOSIS — I10 Essential (primary) hypertension: Secondary | ICD-10-CM | POA: Diagnosis not present

## 2015-01-13 DIAGNOSIS — M5430 Sciatica, unspecified side: Secondary | ICD-10-CM | POA: Diagnosis not present

## 2015-01-13 DIAGNOSIS — I10 Essential (primary) hypertension: Secondary | ICD-10-CM | POA: Diagnosis not present

## 2015-01-13 DIAGNOSIS — K219 Gastro-esophageal reflux disease without esophagitis: Secondary | ICD-10-CM | POA: Diagnosis not present

## 2015-01-14 DIAGNOSIS — K219 Gastro-esophageal reflux disease without esophagitis: Secondary | ICD-10-CM | POA: Diagnosis not present

## 2015-01-14 DIAGNOSIS — I10 Essential (primary) hypertension: Secondary | ICD-10-CM | POA: Diagnosis not present

## 2015-01-14 DIAGNOSIS — M5430 Sciatica, unspecified side: Secondary | ICD-10-CM | POA: Diagnosis not present

## 2015-01-15 DIAGNOSIS — K219 Gastro-esophageal reflux disease without esophagitis: Secondary | ICD-10-CM | POA: Diagnosis not present

## 2015-01-15 DIAGNOSIS — M5430 Sciatica, unspecified side: Secondary | ICD-10-CM | POA: Diagnosis not present

## 2015-01-15 DIAGNOSIS — I10 Essential (primary) hypertension: Secondary | ICD-10-CM | POA: Diagnosis not present

## 2015-01-16 DIAGNOSIS — I10 Essential (primary) hypertension: Secondary | ICD-10-CM | POA: Diagnosis not present

## 2015-01-16 DIAGNOSIS — K219 Gastro-esophageal reflux disease without esophagitis: Secondary | ICD-10-CM | POA: Diagnosis not present

## 2015-01-16 DIAGNOSIS — M5430 Sciatica, unspecified side: Secondary | ICD-10-CM | POA: Diagnosis not present

## 2015-01-17 DIAGNOSIS — K219 Gastro-esophageal reflux disease without esophagitis: Secondary | ICD-10-CM | POA: Diagnosis not present

## 2015-01-17 DIAGNOSIS — I10 Essential (primary) hypertension: Secondary | ICD-10-CM | POA: Diagnosis not present

## 2015-01-17 DIAGNOSIS — M5430 Sciatica, unspecified side: Secondary | ICD-10-CM | POA: Diagnosis not present

## 2015-01-18 DIAGNOSIS — M5430 Sciatica, unspecified side: Secondary | ICD-10-CM | POA: Diagnosis not present

## 2015-01-18 DIAGNOSIS — I10 Essential (primary) hypertension: Secondary | ICD-10-CM | POA: Diagnosis not present

## 2015-01-18 DIAGNOSIS — K219 Gastro-esophageal reflux disease without esophagitis: Secondary | ICD-10-CM | POA: Diagnosis not present

## 2015-01-19 ENCOUNTER — Emergency Department (INDEPENDENT_AMBULATORY_CARE_PROVIDER_SITE_OTHER): Payer: Commercial Managed Care - HMO

## 2015-01-19 ENCOUNTER — Emergency Department (INDEPENDENT_AMBULATORY_CARE_PROVIDER_SITE_OTHER)
Admission: EM | Admit: 2015-01-19 | Discharge: 2015-01-19 | Disposition: A | Discharge status: Home / Self Care | Payer: Commercial Managed Care - HMO | Source: Home / Self Care | Attending: Family Medicine | Admitting: Family Medicine

## 2015-01-19 ENCOUNTER — Encounter (HOSPITAL_COMMUNITY): Payer: Self-pay | Admitting: *Deleted

## 2015-01-19 ENCOUNTER — Emergency Department (HOSPITAL_COMMUNITY): Payer: Commercial Managed Care - HMO

## 2015-01-19 DIAGNOSIS — R05 Cough: Secondary | ICD-10-CM

## 2015-01-19 DIAGNOSIS — J069 Acute upper respiratory infection, unspecified: Secondary | ICD-10-CM

## 2015-01-19 DIAGNOSIS — K219 Gastro-esophageal reflux disease without esophagitis: Secondary | ICD-10-CM | POA: Diagnosis not present

## 2015-01-19 DIAGNOSIS — J4521 Mild intermittent asthma with (acute) exacerbation: Secondary | ICD-10-CM

## 2015-01-19 DIAGNOSIS — R0689 Other abnormalities of breathing: Secondary | ICD-10-CM | POA: Diagnosis not present

## 2015-01-19 DIAGNOSIS — J209 Acute bronchitis, unspecified: Secondary | ICD-10-CM | POA: Diagnosis not present

## 2015-01-19 DIAGNOSIS — M5430 Sciatica, unspecified side: Secondary | ICD-10-CM | POA: Diagnosis not present

## 2015-01-19 DIAGNOSIS — R059 Cough, unspecified: Secondary | ICD-10-CM

## 2015-01-19 DIAGNOSIS — I10 Essential (primary) hypertension: Secondary | ICD-10-CM | POA: Diagnosis not present

## 2015-01-19 MED ORDER — ALBUTEROL SULFATE (2.5 MG/3ML) 0.083% IN NEBU
5.0000 mg | INHALATION_SOLUTION | Freq: Once | RESPIRATORY_TRACT | Status: AC
  Administered 2015-01-19: 5 mg via RESPIRATORY_TRACT

## 2015-01-19 MED ORDER — PREDNISONE 20 MG PO TABS
ORAL_TABLET | ORAL | Status: AC
  Filled 2015-01-19: qty 3

## 2015-01-19 MED ORDER — METHYLPREDNISOLONE SODIUM SUCC 125 MG IJ SOLR
125.0000 mg | Freq: Once | INTRAMUSCULAR | Status: AC
  Administered 2015-01-19: 125 mg via INTRAMUSCULAR

## 2015-01-19 MED ORDER — AZITHROMYCIN 250 MG PO TABS
ORAL_TABLET | ORAL | Status: DC
Start: 2015-01-19 — End: 2018-02-03

## 2015-01-19 MED ORDER — PREDNISONE 50 MG PO TABS: 50.0000 mg | ORAL_TABLET | Freq: Every day | ORAL | Status: DC

## 2015-01-19 MED ORDER — ALBUTEROL SULFATE (2.5 MG/3ML) 0.083% IN NEBU
INHALATION_SOLUTION | RESPIRATORY_TRACT | Status: AC
  Filled 2015-01-19: qty 6

## 2015-01-19 MED ORDER — METHYLPREDNISOLONE SODIUM SUCC 125 MG IJ SOLR
INTRAMUSCULAR | Status: AC
  Filled 2015-01-19: qty 2

## 2015-01-19 MED ORDER — IPRATROPIUM BROMIDE 0.02 % IN SOLN
0.5000 mg | Freq: Once | RESPIRATORY_TRACT | Status: AC
  Administered 2015-01-19: 0.5 mg via RESPIRATORY_TRACT

## 2015-01-19 MED ORDER — IPRATROPIUM BROMIDE 0.02 % IN SOLN
RESPIRATORY_TRACT | Status: AC
  Filled 2015-01-19: qty 2.5

## 2015-01-19 MED ORDER — ALBUTEROL SULFATE HFA 108 (90 BASE) MCG/ACT IN AERS: 2.0000 | INHALATION_SPRAY | RESPIRATORY_TRACT | Status: DC | PRN
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
## 2015-01-19 NOTE — ED Notes (Signed)
Pt  Reports   Symptoms  Of  Cough  /  Congestion          Wheezing  And shortness  Of  Breath           Symptoms  X  1  Week         Out  Of  meds

## 2015-01-19 NOTE — Discharge Instructions (Signed)
Acute Bronchitis Bronchitis is inflammation of the airways that extend from the windpipe into the lungs (bronchi). The inflammation often causes mucus to develop. This leads to a cough, which is the most common symptom of bronchitis.  In acute bronchitis, the condition usually develops suddenly and goes away over time, usually in a couple weeks. Smoking, allergies, and asthma can make bronchitis worse. Repeated episodes of bronchitis may cause further lung problems.  CAUSES Acute bronchitis is most often caused by the same virus that causes a cold. The virus can spread from person to person (contagious) through coughing, sneezing, and touching contaminated objects. SIGNS AND SYMPTOMS   Cough.   Fever.   Coughing up mucus.   Body aches.   Chest congestion.   Chills.   Shortness of breath.   Sore throat.  DIAGNOSIS  Acute bronchitis is usually diagnosed through a physical exam. Your health care provider will also ask you questions about your medical history. Tests, such as chest X-rays, are sometimes done to rule out other conditions.  TREATMENT  Acute bronchitis usually goes away in a couple weeks. Oftentimes, no medical treatment is necessary. Medicines are sometimes given for relief of fever or cough. Antibiotic medicines are usually not needed but may be prescribed in certain situations. In some cases, an inhaler may be recommended to help reduce shortness of breath and control the cough. A cool mist vaporizer may also be used to help thin bronchial secretions and make it easier to clear the chest.  HOME CARE INSTRUCTIONS  Get plenty of rest.   Drink enough fluids to keep your urine clear or pale yellow (unless you have a medical condition that requires fluid restriction). Increasing fluids may help thin your respiratory secretions (sputum) and reduce chest congestion, and it will prevent dehydration.   Take medicines only as directed by your health care provider.  If  you were prescribed an antibiotic medicine, finish it all even if you start to feel better.  Avoid smoking and secondhand smoke. Exposure to cigarette smoke or irritating chemicals will make bronchitis worse. If you are a smoker, consider using nicotine gum or skin patches to help control withdrawal symptoms. Quitting smoking will help your lungs heal faster.   Reduce the chances of another bout of acute bronchitis by washing your hands frequently, avoiding people with cold symptoms, and trying not to touch your hands to your mouth, nose, or eyes.   Keep all follow-up visits as directed by your health care provider.  SEEK MEDICAL CARE IF: Your symptoms do not improve after 1 week of treatment.  SEEK IMMEDIATE MEDICAL CARE IF:  You develop an increased fever or chills.   You have chest pain.   You have severe shortness of breath.  You have bloody sputum.   You develop dehydration.  You faint or repeatedly feel like you are going to pass out.  You develop repeated vomiting.  You develop a severe headache. MAKE SURE YOU:   Understand these instructions.  Will watch your condition.  Will get help right away if you are not doing well or get worse. Document Released: 10/16/2004 Document Revised: 01/23/2014 Document Reviewed: 03/01/2013 [facility] Patient Information 2015 [place], [place]. This information is not intended to replace advice given to you by your health care provider. Make sure you discuss any questions you have with your health care provider.  Asthma, Acute Bronchospasm Acute bronchospasm caused by asthma is also referred to as an asthma attack. Bronchospasm means your air passages become narrowed.  The narrowing is caused by inflammation and tightening of the muscles in the air tubes (bronchi) in your lungs. This can make it hard to breathe or cause you to wheeze and cough. CAUSES Possible triggers are:  Animal dander from the skin, hair, or feathers of  animals.  Dust mites contained in house dust.  Cockroaches.  Pollen from trees or grass.  Mold.  Cigarette or tobacco smoke.  Air pollutants such as dust, household cleaners, hair sprays, aerosol sprays, paint fumes, strong chemicals, or strong odors.  Cold air or weather changes. Cold air may trigger inflammation. Winds increase molds and pollens in the air.  Strong emotions such as crying or laughing hard.  Stress.  Certain medicines such as aspirin or beta-blockers.  Sulfites in foods and drinks, such as dried fruits and wine.  Infections or inflammatory conditions, such as a flu, cold, or inflammation of the nasal membranes (rhinitis).  Gastroesophageal reflux disease (GERD). GERD is a condition where stomach acid backs up into your esophagus.  Exercise or strenuous activity. SIGNS AND SYMPTOMS   Wheezing.  Excessive coughing, particularly at night.  Chest tightness.  Shortness of breath. DIAGNOSIS  Your health care provider will ask you about your medical history and perform a physical exam. A chest X-ray or blood testing may be performed to look for other causes of your symptoms or other conditions that may have triggered your asthma attack. TREATMENT  Treatment is aimed at reducing inflammation and opening up the airways in your lungs. Most asthma attacks are treated with inhaled medicines. These include quick relief or rescue medicines (such as bronchodilators) and controller medicines (such as inhaled corticosteroids). These medicines are sometimes given through an inhaler or a nebulizer. Systemic steroid medicine taken by mouth or given through an IV tube also can be used to reduce the inflammation when an attack is moderate or severe. Antibiotic medicines are only used if a bacterial infection is present.  HOME CARE INSTRUCTIONS   Rest.  Drink plenty of liquids. This helps the mucus to remain thin and be easily coughed up. Only use caffeine in moderation and  do not use alcohol until you have recovered from your illness.  Do not smoke. Avoid being exposed to secondhand smoke.  You play a critical role in keeping yourself in good health. Avoid exposure to things that cause you to wheeze or to have breathing problems.  Keep your medicines up-to-date and available. Carefully follow your health care provider's treatment plan.  Take your medicine exactly as prescribed.  When pollen or pollution is bad, keep windows closed and use an air conditioner or go to places with air conditioning.  Asthma requires careful medical care. See your health care provider for a follow-up as advised. If you are more than [redacted] weeks pregnant and you were prescribed any new medicines, let your obstetrician know about the visit and how you are doing. Follow up with your health care provider as directed.  After you have recovered from your asthma attack, make an appointment with your outpatient doctor to talk about ways to reduce the likelihood of future attacks. If you do not have a doctor who manages your asthma, make an appointment with a primary care doctor to discuss your asthma. SEEK IMMEDIATE MEDICAL CARE IF:   You are getting worse.  You have trouble breathing. If severe, call your local emergency services (911 in the [place]  You develop chest pain or discomfort.  You are vomiting.  You are not able  to keep fluids down.  You are coughing up yellow, green, brown, or bloody sputum.  You have a fever and your symptoms suddenly get worse.  You have trouble swallowing. MAKE SURE YOU:   Understand these instructions.  Will watch your condition.  Will get help right away if you are not doing well or get worse. Document Released: 12/24/2006 Document Revised: 09/13/2013 Document Reviewed: 03/16/2013 [facility] Patient Information 2015 [place], [place]. This information is not intended to replace advice given to you by your health care provider. Make sure you  discuss any questions you have with your health care provider.  Bronchospasm A bronchospasm is a spasm or tightening of the airways going into the lungs. During a bronchospasm breathing becomes more difficult because the airways get smaller. When this happens there can be coughing, a whistling sound when breathing (wheezing), and difficulty breathing. Bronchospasm is often associated with asthma, but not all patients who experience a bronchospasm have asthma. CAUSES  A bronchospasm is caused by inflammation or irritation of the airways. The inflammation or irritation may be triggered by:   Allergies (such as to animals, pollen, food, or mold). Allergens that cause bronchospasm may cause wheezing immediately after exposure or many hours later.   Infection. Viral infections are believed to be the most common cause of bronchospasm.   Exercise.   Irritants (such as pollution, cigarette smoke, strong odors, aerosol sprays, and paint fumes).   Weather changes. Winds increase molds and pollens in the air. Rain refreshes the air by washing irritants out. Cold air may cause inflammation.   Stress and emotional upset.  SIGNS AND SYMPTOMS   Wheezing.   Excessive nighttime coughing.   Frequent or severe coughing with a simple cold.   Chest tightness.   Shortness of breath.  DIAGNOSIS  Bronchospasm is usually diagnosed through a history and physical exam. Tests, such as chest X-rays, are sometimes done to look for other conditions. TREATMENT   Inhaled medicines can be given to open up your airways and help you breathe. The medicines can be given using either an inhaler or a nebulizer machine.  Corticosteroid medicines may be given for severe bronchospasm, usually when it is associated with asthma. HOME CARE INSTRUCTIONS   Always have a plan prepared for seeking medical care. Know when to call your health care provider and local emergency services (911 in the [place] Know where you  can access local emergency care.  Only take medicines as directed by your health care provider.  If you were prescribed an inhaler or nebulizer machine, ask your health care provider to explain how to use it correctly. Always use a spacer with your inhaler if you were given one.  It is necessary to remain calm during an attack. Try to relax and breathe more slowly.  Control your home environment in the following ways:   Change your heating and air conditioning filter at least once a month.   Limit your use of fireplaces and wood stoves.  Do not smoke and do not allow smoking in your home.   Avoid exposure to perfumes and fragrances.   Get rid of pests (such as roaches and mice) and their droppings.   Throw away plants if you see mold on them.   Keep your house clean and dust free.   Replace carpet with wood, tile, or vinyl flooring. Carpet can trap dander and dust.   Use allergy-proof pillows, mattress covers, and box spring covers.   Wash bed sheets and blankets every  week in hot water and dry them in a dryer.   Use blankets that are made of polyester or cotton.   Wash hands frequently. SEEK MEDICAL CARE IF:   You have muscle aches.   You have chest pain.   The sputum changes from clear or white to yellow, green, gray, or bloody.   The sputum you cough up gets thicker.   There are problems that may be related to the medicine you are given, such as a rash, itching, swelling, or trouble breathing.  SEEK IMMEDIATE MEDICAL CARE IF:   You have worsening wheezing and coughing even after taking your prescribed medicines.   You have increased difficulty breathing.   You develop severe chest pain. MAKE SURE YOU:   Understand these instructions.  Will watch your condition.  Will get help right away if you are not doing well or get worse. Document Released: 09/11/2003 Document Revised: 09/13/2013 Document Reviewed: 02/28/2013 [facility] Patient  Information 2015 [place]. This information is not intended to replace advice given to you by your health care provider. Make sure you discuss any questions you have with your health care provider.

## 2015-01-20 DIAGNOSIS — I10 Essential (primary) hypertension: Secondary | ICD-10-CM | POA: Diagnosis not present

## 2015-01-20 DIAGNOSIS — K219 Gastro-esophageal reflux disease without esophagitis: Secondary | ICD-10-CM | POA: Diagnosis not present

## 2015-01-20 DIAGNOSIS — M5430 Sciatica, unspecified side: Secondary | ICD-10-CM | POA: Diagnosis not present

## 2015-01-21 DIAGNOSIS — K219 Gastro-esophageal reflux disease without esophagitis: Secondary | ICD-10-CM | POA: Diagnosis not present

## 2015-01-21 DIAGNOSIS — I10 Essential (primary) hypertension: Secondary | ICD-10-CM | POA: Diagnosis not present

## 2015-01-21 DIAGNOSIS — M5430 Sciatica, unspecified side: Secondary | ICD-10-CM | POA: Diagnosis not present

## 2015-01-22 DIAGNOSIS — I10 Essential (primary) hypertension: Secondary | ICD-10-CM | POA: Diagnosis not present

## 2015-01-22 DIAGNOSIS — M5430 Sciatica, unspecified side: Secondary | ICD-10-CM | POA: Diagnosis not present

## 2015-01-22 DIAGNOSIS — K219 Gastro-esophageal reflux disease without esophagitis: Secondary | ICD-10-CM | POA: Diagnosis not present

## 2015-01-23 DIAGNOSIS — M5430 Sciatica, unspecified side: Secondary | ICD-10-CM | POA: Diagnosis not present

## 2015-01-23 DIAGNOSIS — K219 Gastro-esophageal reflux disease without esophagitis: Secondary | ICD-10-CM | POA: Diagnosis not present

## 2015-01-23 DIAGNOSIS — I10 Essential (primary) hypertension: Secondary | ICD-10-CM | POA: Diagnosis not present

## 2015-01-24 DIAGNOSIS — I10 Essential (primary) hypertension: Secondary | ICD-10-CM | POA: Diagnosis not present

## 2015-01-24 DIAGNOSIS — K219 Gastro-esophageal reflux disease without esophagitis: Secondary | ICD-10-CM | POA: Diagnosis not present

## 2015-01-24 DIAGNOSIS — M5430 Sciatica, unspecified side: Secondary | ICD-10-CM | POA: Diagnosis not present

## 2015-01-25 DIAGNOSIS — K219 Gastro-esophageal reflux disease without esophagitis: Secondary | ICD-10-CM | POA: Diagnosis not present

## 2015-01-25 DIAGNOSIS — I10 Essential (primary) hypertension: Secondary | ICD-10-CM | POA: Diagnosis not present

## 2015-01-25 DIAGNOSIS — M5430 Sciatica, unspecified side: Secondary | ICD-10-CM | POA: Diagnosis not present

## 2015-01-26 DIAGNOSIS — I10 Essential (primary) hypertension: Secondary | ICD-10-CM | POA: Diagnosis not present

## 2015-01-26 DIAGNOSIS — K219 Gastro-esophageal reflux disease without esophagitis: Secondary | ICD-10-CM | POA: Diagnosis not present

## 2015-01-26 DIAGNOSIS — M5430 Sciatica, unspecified side: Secondary | ICD-10-CM | POA: Diagnosis not present

## 2015-01-27 DIAGNOSIS — I10 Essential (primary) hypertension: Secondary | ICD-10-CM | POA: Diagnosis not present

## 2015-01-27 DIAGNOSIS — K219 Gastro-esophageal reflux disease without esophagitis: Secondary | ICD-10-CM | POA: Diagnosis not present

## 2015-01-27 DIAGNOSIS — M5430 Sciatica, unspecified side: Secondary | ICD-10-CM | POA: Diagnosis not present

## 2015-01-28 DIAGNOSIS — I10 Essential (primary) hypertension: Secondary | ICD-10-CM | POA: Diagnosis not present

## 2015-01-28 DIAGNOSIS — K219 Gastro-esophageal reflux disease without esophagitis: Secondary | ICD-10-CM | POA: Diagnosis not present

## 2015-01-28 DIAGNOSIS — M5430 Sciatica, unspecified side: Secondary | ICD-10-CM | POA: Diagnosis not present

## 2015-01-29 DIAGNOSIS — I10 Essential (primary) hypertension: Secondary | ICD-10-CM | POA: Diagnosis not present

## 2015-01-29 DIAGNOSIS — K219 Gastro-esophageal reflux disease without esophagitis: Secondary | ICD-10-CM | POA: Diagnosis not present

## 2015-01-29 DIAGNOSIS — M5430 Sciatica, unspecified side: Secondary | ICD-10-CM | POA: Diagnosis not present

## 2015-01-30 DIAGNOSIS — I10 Essential (primary) hypertension: Secondary | ICD-10-CM | POA: Diagnosis not present

## 2015-01-30 DIAGNOSIS — M5430 Sciatica, unspecified side: Secondary | ICD-10-CM | POA: Diagnosis not present

## 2015-01-30 DIAGNOSIS — K219 Gastro-esophageal reflux disease without esophagitis: Secondary | ICD-10-CM | POA: Diagnosis not present

## 2015-01-31 DIAGNOSIS — M5430 Sciatica, unspecified side: Secondary | ICD-10-CM | POA: Diagnosis not present

## 2015-01-31 DIAGNOSIS — I10 Essential (primary) hypertension: Secondary | ICD-10-CM | POA: Diagnosis not present

## 2015-01-31 DIAGNOSIS — K219 Gastro-esophageal reflux disease without esophagitis: Secondary | ICD-10-CM | POA: Diagnosis not present

## 2015-02-01 DIAGNOSIS — M5430 Sciatica, unspecified side: Secondary | ICD-10-CM | POA: Diagnosis not present

## 2015-02-01 DIAGNOSIS — I10 Essential (primary) hypertension: Secondary | ICD-10-CM | POA: Diagnosis not present

## 2015-02-01 DIAGNOSIS — K219 Gastro-esophageal reflux disease without esophagitis: Secondary | ICD-10-CM | POA: Diagnosis not present

## 2015-02-02 DIAGNOSIS — K219 Gastro-esophageal reflux disease without esophagitis: Secondary | ICD-10-CM | POA: Diagnosis not present

## 2015-02-02 DIAGNOSIS — I10 Essential (primary) hypertension: Secondary | ICD-10-CM | POA: Diagnosis not present

## 2015-02-02 DIAGNOSIS — M5430 Sciatica, unspecified side: Secondary | ICD-10-CM | POA: Diagnosis not present

## 2015-02-03 DIAGNOSIS — K219 Gastro-esophageal reflux disease without esophagitis: Secondary | ICD-10-CM | POA: Diagnosis not present

## 2015-02-03 DIAGNOSIS — M5430 Sciatica, unspecified side: Secondary | ICD-10-CM | POA: Diagnosis not present

## 2015-02-03 DIAGNOSIS — I10 Essential (primary) hypertension: Secondary | ICD-10-CM | POA: Diagnosis not present

## 2015-02-04 DIAGNOSIS — K219 Gastro-esophageal reflux disease without esophagitis: Secondary | ICD-10-CM | POA: Diagnosis not present

## 2015-02-04 DIAGNOSIS — I10 Essential (primary) hypertension: Secondary | ICD-10-CM | POA: Diagnosis not present

## 2015-02-04 DIAGNOSIS — M5430 Sciatica, unspecified side: Secondary | ICD-10-CM | POA: Diagnosis not present

## 2015-02-05 DIAGNOSIS — K219 Gastro-esophageal reflux disease without esophagitis: Secondary | ICD-10-CM | POA: Diagnosis not present

## 2015-02-05 DIAGNOSIS — M5430 Sciatica, unspecified side: Secondary | ICD-10-CM | POA: Diagnosis not present

## 2015-02-05 DIAGNOSIS — I10 Essential (primary) hypertension: Secondary | ICD-10-CM | POA: Diagnosis not present

## 2015-02-06 DIAGNOSIS — I10 Essential (primary) hypertension: Secondary | ICD-10-CM | POA: Diagnosis not present

## 2015-02-06 DIAGNOSIS — M5430 Sciatica, unspecified side: Secondary | ICD-10-CM | POA: Diagnosis not present

## 2015-02-06 DIAGNOSIS — K219 Gastro-esophageal reflux disease without esophagitis: Secondary | ICD-10-CM | POA: Diagnosis not present

## 2015-02-07 DIAGNOSIS — M5430 Sciatica, unspecified side: Secondary | ICD-10-CM | POA: Diagnosis not present

## 2015-02-07 DIAGNOSIS — K219 Gastro-esophageal reflux disease without esophagitis: Secondary | ICD-10-CM | POA: Diagnosis not present

## 2015-02-07 DIAGNOSIS — I10 Essential (primary) hypertension: Secondary | ICD-10-CM | POA: Diagnosis not present

## 2015-02-08 DIAGNOSIS — I10 Essential (primary) hypertension: Secondary | ICD-10-CM | POA: Diagnosis not present

## 2015-02-08 DIAGNOSIS — K219 Gastro-esophageal reflux disease without esophagitis: Secondary | ICD-10-CM | POA: Diagnosis not present

## 2015-02-08 DIAGNOSIS — M5430 Sciatica, unspecified side: Secondary | ICD-10-CM | POA: Diagnosis not present

## 2015-02-09 DIAGNOSIS — M5430 Sciatica, unspecified side: Secondary | ICD-10-CM | POA: Diagnosis not present

## 2015-02-09 DIAGNOSIS — K219 Gastro-esophageal reflux disease without esophagitis: Secondary | ICD-10-CM | POA: Diagnosis not present

## 2015-02-09 DIAGNOSIS — I10 Essential (primary) hypertension: Secondary | ICD-10-CM | POA: Diagnosis not present

## 2015-02-10 DIAGNOSIS — M5430 Sciatica, unspecified side: Secondary | ICD-10-CM | POA: Diagnosis not present

## 2015-02-10 DIAGNOSIS — I10 Essential (primary) hypertension: Secondary | ICD-10-CM | POA: Diagnosis not present

## 2015-02-10 DIAGNOSIS — K219 Gastro-esophageal reflux disease without esophagitis: Secondary | ICD-10-CM | POA: Diagnosis not present

## 2015-02-11 DIAGNOSIS — K219 Gastro-esophageal reflux disease without esophagitis: Secondary | ICD-10-CM | POA: Diagnosis not present

## 2015-02-11 DIAGNOSIS — I10 Essential (primary) hypertension: Secondary | ICD-10-CM | POA: Diagnosis not present

## 2015-02-11 DIAGNOSIS — M5430 Sciatica, unspecified side: Secondary | ICD-10-CM | POA: Diagnosis not present

## 2015-02-12 DIAGNOSIS — K219 Gastro-esophageal reflux disease without esophagitis: Secondary | ICD-10-CM | POA: Diagnosis not present

## 2015-02-12 DIAGNOSIS — I10 Essential (primary) hypertension: Secondary | ICD-10-CM | POA: Diagnosis not present

## 2015-02-12 DIAGNOSIS — M5430 Sciatica, unspecified side: Secondary | ICD-10-CM | POA: Diagnosis not present

## 2015-02-13 DIAGNOSIS — K219 Gastro-esophageal reflux disease without esophagitis: Secondary | ICD-10-CM | POA: Diagnosis not present

## 2015-02-13 DIAGNOSIS — M5430 Sciatica, unspecified side: Secondary | ICD-10-CM | POA: Diagnosis not present

## 2015-02-13 DIAGNOSIS — I10 Essential (primary) hypertension: Secondary | ICD-10-CM | POA: Diagnosis not present

## 2015-02-14 DIAGNOSIS — M5430 Sciatica, unspecified side: Secondary | ICD-10-CM | POA: Diagnosis not present

## 2015-02-14 DIAGNOSIS — K219 Gastro-esophageal reflux disease without esophagitis: Secondary | ICD-10-CM | POA: Diagnosis not present

## 2015-02-14 DIAGNOSIS — I10 Essential (primary) hypertension: Secondary | ICD-10-CM | POA: Diagnosis not present

## 2015-02-15 DIAGNOSIS — M5430 Sciatica, unspecified side: Secondary | ICD-10-CM | POA: Diagnosis not present

## 2015-02-15 DIAGNOSIS — I10 Essential (primary) hypertension: Secondary | ICD-10-CM | POA: Diagnosis not present

## 2015-02-15 DIAGNOSIS — K219 Gastro-esophageal reflux disease without esophagitis: Secondary | ICD-10-CM | POA: Diagnosis not present

## 2015-02-16 DIAGNOSIS — K219 Gastro-esophageal reflux disease without esophagitis: Secondary | ICD-10-CM | POA: Diagnosis not present

## 2015-02-16 DIAGNOSIS — M5430 Sciatica, unspecified side: Secondary | ICD-10-CM | POA: Diagnosis not present

## 2015-02-16 DIAGNOSIS — I10 Essential (primary) hypertension: Secondary | ICD-10-CM | POA: Diagnosis not present

## 2015-02-17 DIAGNOSIS — K219 Gastro-esophageal reflux disease without esophagitis: Secondary | ICD-10-CM | POA: Diagnosis not present

## 2015-02-17 DIAGNOSIS — I10 Essential (primary) hypertension: Secondary | ICD-10-CM | POA: Diagnosis not present

## 2015-02-17 DIAGNOSIS — M5430 Sciatica, unspecified side: Secondary | ICD-10-CM | POA: Diagnosis not present

## 2015-02-18 DIAGNOSIS — I10 Essential (primary) hypertension: Secondary | ICD-10-CM | POA: Diagnosis not present

## 2015-02-18 DIAGNOSIS — K219 Gastro-esophageal reflux disease without esophagitis: Secondary | ICD-10-CM | POA: Diagnosis not present

## 2015-02-18 DIAGNOSIS — M5430 Sciatica, unspecified side: Secondary | ICD-10-CM | POA: Diagnosis not present

## 2015-02-19 DIAGNOSIS — M5430 Sciatica, unspecified side: Secondary | ICD-10-CM | POA: Diagnosis not present

## 2015-02-19 DIAGNOSIS — I10 Essential (primary) hypertension: Secondary | ICD-10-CM | POA: Diagnosis not present

## 2015-02-19 DIAGNOSIS — K219 Gastro-esophageal reflux disease without esophagitis: Secondary | ICD-10-CM | POA: Diagnosis not present

## 2015-02-20 DIAGNOSIS — K219 Gastro-esophageal reflux disease without esophagitis: Secondary | ICD-10-CM | POA: Diagnosis not present

## 2015-02-20 DIAGNOSIS — M5430 Sciatica, unspecified side: Secondary | ICD-10-CM | POA: Diagnosis not present

## 2015-02-20 DIAGNOSIS — I10 Essential (primary) hypertension: Secondary | ICD-10-CM | POA: Diagnosis not present

## 2015-02-26 DIAGNOSIS — K219 Gastro-esophageal reflux disease without esophagitis: Secondary | ICD-10-CM | POA: Diagnosis not present

## 2015-02-26 DIAGNOSIS — M5430 Sciatica, unspecified side: Secondary | ICD-10-CM | POA: Diagnosis not present

## 2015-02-26 DIAGNOSIS — I10 Essential (primary) hypertension: Secondary | ICD-10-CM | POA: Diagnosis not present

## 2015-02-27 DIAGNOSIS — K219 Gastro-esophageal reflux disease without esophagitis: Secondary | ICD-10-CM | POA: Diagnosis not present

## 2015-02-27 DIAGNOSIS — I10 Essential (primary) hypertension: Secondary | ICD-10-CM | POA: Diagnosis not present

## 2015-02-27 DIAGNOSIS — M5430 Sciatica, unspecified side: Secondary | ICD-10-CM | POA: Diagnosis not present

## 2015-02-28 DIAGNOSIS — I10 Essential (primary) hypertension: Secondary | ICD-10-CM | POA: Diagnosis not present

## 2015-02-28 DIAGNOSIS — K219 Gastro-esophageal reflux disease without esophagitis: Secondary | ICD-10-CM | POA: Diagnosis not present

## 2015-02-28 DIAGNOSIS — M5430 Sciatica, unspecified side: Secondary | ICD-10-CM | POA: Diagnosis not present

## 2015-03-01 DIAGNOSIS — I10 Essential (primary) hypertension: Secondary | ICD-10-CM | POA: Diagnosis not present

## 2015-03-01 DIAGNOSIS — K219 Gastro-esophageal reflux disease without esophagitis: Secondary | ICD-10-CM | POA: Diagnosis not present

## 2015-03-01 DIAGNOSIS — M5430 Sciatica, unspecified side: Secondary | ICD-10-CM | POA: Diagnosis not present

## 2015-03-02 DIAGNOSIS — I10 Essential (primary) hypertension: Secondary | ICD-10-CM | POA: Diagnosis not present

## 2015-03-02 DIAGNOSIS — K219 Gastro-esophageal reflux disease without esophagitis: Secondary | ICD-10-CM | POA: Diagnosis not present

## 2015-03-02 DIAGNOSIS — M5430 Sciatica, unspecified side: Secondary | ICD-10-CM | POA: Diagnosis not present

## 2015-03-03 DIAGNOSIS — K219 Gastro-esophageal reflux disease without esophagitis: Secondary | ICD-10-CM | POA: Diagnosis not present

## 2015-03-03 DIAGNOSIS — I10 Essential (primary) hypertension: Secondary | ICD-10-CM | POA: Diagnosis not present

## 2015-03-03 DIAGNOSIS — M5430 Sciatica, unspecified side: Secondary | ICD-10-CM | POA: Diagnosis not present

## 2015-03-04 DIAGNOSIS — M5430 Sciatica, unspecified side: Secondary | ICD-10-CM | POA: Diagnosis not present

## 2015-03-04 DIAGNOSIS — K219 Gastro-esophageal reflux disease without esophagitis: Secondary | ICD-10-CM | POA: Diagnosis not present

## 2015-03-04 DIAGNOSIS — I10 Essential (primary) hypertension: Secondary | ICD-10-CM | POA: Diagnosis not present

## 2015-03-05 DIAGNOSIS — M5430 Sciatica, unspecified side: Secondary | ICD-10-CM | POA: Diagnosis not present

## 2015-03-05 DIAGNOSIS — K219 Gastro-esophageal reflux disease without esophagitis: Secondary | ICD-10-CM | POA: Diagnosis not present

## 2015-03-05 DIAGNOSIS — I10 Essential (primary) hypertension: Secondary | ICD-10-CM | POA: Diagnosis not present

## 2015-03-06 DIAGNOSIS — K219 Gastro-esophageal reflux disease without esophagitis: Secondary | ICD-10-CM | POA: Diagnosis not present

## 2015-03-06 DIAGNOSIS — M5430 Sciatica, unspecified side: Secondary | ICD-10-CM | POA: Diagnosis not present

## 2015-03-06 DIAGNOSIS — I10 Essential (primary) hypertension: Secondary | ICD-10-CM | POA: Diagnosis not present

## 2015-03-07 DIAGNOSIS — M5430 Sciatica, unspecified side: Secondary | ICD-10-CM | POA: Diagnosis not present

## 2015-03-07 DIAGNOSIS — K219 Gastro-esophageal reflux disease without esophagitis: Secondary | ICD-10-CM | POA: Diagnosis not present

## 2015-03-07 DIAGNOSIS — I10 Essential (primary) hypertension: Secondary | ICD-10-CM | POA: Diagnosis not present

## 2015-03-08 DIAGNOSIS — K219 Gastro-esophageal reflux disease without esophagitis: Secondary | ICD-10-CM | POA: Diagnosis not present

## 2015-03-08 DIAGNOSIS — M5430 Sciatica, unspecified side: Secondary | ICD-10-CM | POA: Diagnosis not present

## 2015-03-08 DIAGNOSIS — I10 Essential (primary) hypertension: Secondary | ICD-10-CM | POA: Diagnosis not present

## 2015-03-09 DIAGNOSIS — M5430 Sciatica, unspecified side: Secondary | ICD-10-CM | POA: Diagnosis not present

## 2015-03-09 DIAGNOSIS — K219 Gastro-esophageal reflux disease without esophagitis: Secondary | ICD-10-CM | POA: Diagnosis not present

## 2015-03-09 DIAGNOSIS — I10 Essential (primary) hypertension: Secondary | ICD-10-CM | POA: Diagnosis not present

## 2015-03-10 DIAGNOSIS — K219 Gastro-esophageal reflux disease without esophagitis: Secondary | ICD-10-CM | POA: Diagnosis not present

## 2015-03-10 DIAGNOSIS — M5430 Sciatica, unspecified side: Secondary | ICD-10-CM | POA: Diagnosis not present

## 2015-03-10 DIAGNOSIS — I10 Essential (primary) hypertension: Secondary | ICD-10-CM | POA: Diagnosis not present

## 2015-03-11 DIAGNOSIS — M5430 Sciatica, unspecified side: Secondary | ICD-10-CM | POA: Diagnosis not present

## 2015-03-11 DIAGNOSIS — I10 Essential (primary) hypertension: Secondary | ICD-10-CM | POA: Diagnosis not present

## 2015-03-11 DIAGNOSIS — K219 Gastro-esophageal reflux disease without esophagitis: Secondary | ICD-10-CM | POA: Diagnosis not present

## 2015-03-12 DIAGNOSIS — M5430 Sciatica, unspecified side: Secondary | ICD-10-CM | POA: Diagnosis not present

## 2015-03-12 DIAGNOSIS — K219 Gastro-esophageal reflux disease without esophagitis: Secondary | ICD-10-CM | POA: Diagnosis not present

## 2015-03-12 DIAGNOSIS — I10 Essential (primary) hypertension: Secondary | ICD-10-CM | POA: Diagnosis not present

## 2015-03-13 DIAGNOSIS — K219 Gastro-esophageal reflux disease without esophagitis: Secondary | ICD-10-CM | POA: Diagnosis not present

## 2015-03-13 DIAGNOSIS — M5430 Sciatica, unspecified side: Secondary | ICD-10-CM | POA: Diagnosis not present

## 2015-03-13 DIAGNOSIS — I10 Essential (primary) hypertension: Secondary | ICD-10-CM | POA: Diagnosis not present

## 2015-03-22 DIAGNOSIS — K219 Gastro-esophageal reflux disease without esophagitis: Secondary | ICD-10-CM | POA: Diagnosis not present

## 2015-03-22 DIAGNOSIS — I10 Essential (primary) hypertension: Secondary | ICD-10-CM | POA: Diagnosis not present

## 2015-03-22 DIAGNOSIS — M5430 Sciatica, unspecified side: Secondary | ICD-10-CM | POA: Diagnosis not present

## 2015-03-23 DIAGNOSIS — M5430 Sciatica, unspecified side: Secondary | ICD-10-CM | POA: Diagnosis not present

## 2015-03-23 DIAGNOSIS — K219 Gastro-esophageal reflux disease without esophagitis: Secondary | ICD-10-CM | POA: Diagnosis not present

## 2015-03-23 DIAGNOSIS — I10 Essential (primary) hypertension: Secondary | ICD-10-CM | POA: Diagnosis not present

## 2015-03-24 DIAGNOSIS — K219 Gastro-esophageal reflux disease without esophagitis: Secondary | ICD-10-CM | POA: Diagnosis not present

## 2015-03-24 DIAGNOSIS — M5430 Sciatica, unspecified side: Secondary | ICD-10-CM | POA: Diagnosis not present

## 2015-03-24 DIAGNOSIS — I10 Essential (primary) hypertension: Secondary | ICD-10-CM | POA: Diagnosis not present

## 2015-03-25 DIAGNOSIS — K219 Gastro-esophageal reflux disease without esophagitis: Secondary | ICD-10-CM | POA: Diagnosis not present

## 2015-03-25 DIAGNOSIS — M5430 Sciatica, unspecified side: Secondary | ICD-10-CM | POA: Diagnosis not present

## 2015-03-25 DIAGNOSIS — I10 Essential (primary) hypertension: Secondary | ICD-10-CM | POA: Diagnosis not present

## 2015-03-27 DIAGNOSIS — K219 Gastro-esophageal reflux disease without esophagitis: Secondary | ICD-10-CM | POA: Diagnosis not present

## 2015-03-27 DIAGNOSIS — I10 Essential (primary) hypertension: Secondary | ICD-10-CM | POA: Diagnosis not present

## 2015-03-27 DIAGNOSIS — M5430 Sciatica, unspecified side: Secondary | ICD-10-CM | POA: Diagnosis not present

## 2015-03-28 DIAGNOSIS — K219 Gastro-esophageal reflux disease without esophagitis: Secondary | ICD-10-CM | POA: Diagnosis not present

## 2015-03-28 DIAGNOSIS — M5430 Sciatica, unspecified side: Secondary | ICD-10-CM | POA: Diagnosis not present

## 2015-03-28 DIAGNOSIS — I10 Essential (primary) hypertension: Secondary | ICD-10-CM | POA: Diagnosis not present

## 2015-03-29 DIAGNOSIS — M5430 Sciatica, unspecified side: Secondary | ICD-10-CM | POA: Diagnosis not present

## 2015-03-29 DIAGNOSIS — I10 Essential (primary) hypertension: Secondary | ICD-10-CM | POA: Diagnosis not present

## 2015-03-29 DIAGNOSIS — K219 Gastro-esophageal reflux disease without esophagitis: Secondary | ICD-10-CM | POA: Diagnosis not present

## 2015-03-30 DIAGNOSIS — M5430 Sciatica, unspecified side: Secondary | ICD-10-CM | POA: Diagnosis not present

## 2015-03-30 DIAGNOSIS — I10 Essential (primary) hypertension: Secondary | ICD-10-CM | POA: Diagnosis not present

## 2015-03-30 DIAGNOSIS — K219 Gastro-esophageal reflux disease without esophagitis: Secondary | ICD-10-CM | POA: Diagnosis not present

## 2015-03-31 DIAGNOSIS — K219 Gastro-esophageal reflux disease without esophagitis: Secondary | ICD-10-CM | POA: Diagnosis not present

## 2015-03-31 DIAGNOSIS — I10 Essential (primary) hypertension: Secondary | ICD-10-CM | POA: Diagnosis not present

## 2015-03-31 DIAGNOSIS — M5430 Sciatica, unspecified side: Secondary | ICD-10-CM | POA: Diagnosis not present

## 2015-04-01 DIAGNOSIS — I10 Essential (primary) hypertension: Secondary | ICD-10-CM | POA: Diagnosis not present

## 2015-04-01 DIAGNOSIS — M5430 Sciatica, unspecified side: Secondary | ICD-10-CM | POA: Diagnosis not present

## 2015-04-01 DIAGNOSIS — K219 Gastro-esophageal reflux disease without esophagitis: Secondary | ICD-10-CM | POA: Diagnosis not present

## 2015-04-02 DIAGNOSIS — M5430 Sciatica, unspecified side: Secondary | ICD-10-CM | POA: Diagnosis not present

## 2015-04-02 DIAGNOSIS — I10 Essential (primary) hypertension: Secondary | ICD-10-CM | POA: Diagnosis not present

## 2015-04-02 DIAGNOSIS — K219 Gastro-esophageal reflux disease without esophagitis: Secondary | ICD-10-CM | POA: Diagnosis not present

## 2015-04-03 DIAGNOSIS — K219 Gastro-esophageal reflux disease without esophagitis: Secondary | ICD-10-CM | POA: Diagnosis not present

## 2015-04-03 DIAGNOSIS — M5430 Sciatica, unspecified side: Secondary | ICD-10-CM | POA: Diagnosis not present

## 2015-04-03 DIAGNOSIS — I10 Essential (primary) hypertension: Secondary | ICD-10-CM | POA: Diagnosis not present

## 2015-04-04 DIAGNOSIS — K219 Gastro-esophageal reflux disease without esophagitis: Secondary | ICD-10-CM | POA: Diagnosis not present

## 2015-04-04 DIAGNOSIS — I10 Essential (primary) hypertension: Secondary | ICD-10-CM | POA: Diagnosis not present

## 2015-04-04 DIAGNOSIS — M5430 Sciatica, unspecified side: Secondary | ICD-10-CM | POA: Diagnosis not present

## 2015-04-05 DIAGNOSIS — M5430 Sciatica, unspecified side: Secondary | ICD-10-CM | POA: Diagnosis not present

## 2015-04-05 DIAGNOSIS — I10 Essential (primary) hypertension: Secondary | ICD-10-CM | POA: Diagnosis not present

## 2015-04-05 DIAGNOSIS — K219 Gastro-esophageal reflux disease without esophagitis: Secondary | ICD-10-CM | POA: Diagnosis not present

## 2015-04-06 DIAGNOSIS — M5430 Sciatica, unspecified side: Secondary | ICD-10-CM | POA: Diagnosis not present

## 2015-04-06 DIAGNOSIS — I10 Essential (primary) hypertension: Secondary | ICD-10-CM | POA: Diagnosis not present

## 2015-04-06 DIAGNOSIS — K219 Gastro-esophageal reflux disease without esophagitis: Secondary | ICD-10-CM | POA: Diagnosis not present

## 2015-04-07 DIAGNOSIS — K219 Gastro-esophageal reflux disease without esophagitis: Secondary | ICD-10-CM | POA: Diagnosis not present

## 2015-04-07 DIAGNOSIS — I10 Essential (primary) hypertension: Secondary | ICD-10-CM | POA: Diagnosis not present

## 2015-04-07 DIAGNOSIS — M5430 Sciatica, unspecified side: Secondary | ICD-10-CM | POA: Diagnosis not present

## 2015-04-08 DIAGNOSIS — K219 Gastro-esophageal reflux disease without esophagitis: Secondary | ICD-10-CM | POA: Diagnosis not present

## 2015-04-08 DIAGNOSIS — M5430 Sciatica, unspecified side: Secondary | ICD-10-CM | POA: Diagnosis not present

## 2015-04-08 DIAGNOSIS — I10 Essential (primary) hypertension: Secondary | ICD-10-CM | POA: Diagnosis not present

## 2015-04-09 DIAGNOSIS — M5430 Sciatica, unspecified side: Secondary | ICD-10-CM | POA: Diagnosis not present

## 2015-04-09 DIAGNOSIS — K219 Gastro-esophageal reflux disease without esophagitis: Secondary | ICD-10-CM | POA: Diagnosis not present

## 2015-04-09 DIAGNOSIS — I10 Essential (primary) hypertension: Secondary | ICD-10-CM | POA: Diagnosis not present

## 2015-04-10 DIAGNOSIS — K219 Gastro-esophageal reflux disease without esophagitis: Secondary | ICD-10-CM | POA: Diagnosis not present

## 2015-04-10 DIAGNOSIS — M5430 Sciatica, unspecified side: Secondary | ICD-10-CM | POA: Diagnosis not present

## 2015-04-10 DIAGNOSIS — I10 Essential (primary) hypertension: Secondary | ICD-10-CM | POA: Diagnosis not present

## 2015-04-11 DIAGNOSIS — I10 Essential (primary) hypertension: Secondary | ICD-10-CM | POA: Diagnosis not present

## 2015-04-11 DIAGNOSIS — K219 Gastro-esophageal reflux disease without esophagitis: Secondary | ICD-10-CM | POA: Diagnosis not present

## 2015-04-11 DIAGNOSIS — M5430 Sciatica, unspecified side: Secondary | ICD-10-CM | POA: Diagnosis not present

## 2015-04-12 DIAGNOSIS — K219 Gastro-esophageal reflux disease without esophagitis: Secondary | ICD-10-CM | POA: Diagnosis not present

## 2015-04-12 DIAGNOSIS — M5430 Sciatica, unspecified side: Secondary | ICD-10-CM | POA: Diagnosis not present

## 2015-04-12 DIAGNOSIS — I10 Essential (primary) hypertension: Secondary | ICD-10-CM | POA: Diagnosis not present

## 2015-04-13 DIAGNOSIS — M5430 Sciatica, unspecified side: Secondary | ICD-10-CM | POA: Diagnosis not present

## 2015-04-13 DIAGNOSIS — I10 Essential (primary) hypertension: Secondary | ICD-10-CM | POA: Diagnosis not present

## 2015-04-13 DIAGNOSIS — K219 Gastro-esophageal reflux disease without esophagitis: Secondary | ICD-10-CM | POA: Diagnosis not present

## 2015-04-14 DIAGNOSIS — K219 Gastro-esophageal reflux disease without esophagitis: Secondary | ICD-10-CM | POA: Diagnosis not present

## 2015-04-14 DIAGNOSIS — M5430 Sciatica, unspecified side: Secondary | ICD-10-CM | POA: Diagnosis not present

## 2015-04-14 DIAGNOSIS — I10 Essential (primary) hypertension: Secondary | ICD-10-CM | POA: Diagnosis not present

## 2015-04-15 DIAGNOSIS — M5430 Sciatica, unspecified side: Secondary | ICD-10-CM | POA: Diagnosis not present

## 2015-04-15 DIAGNOSIS — K219 Gastro-esophageal reflux disease without esophagitis: Secondary | ICD-10-CM | POA: Diagnosis not present

## 2015-04-15 DIAGNOSIS — I10 Essential (primary) hypertension: Secondary | ICD-10-CM | POA: Diagnosis not present

## 2015-04-16 DIAGNOSIS — K219 Gastro-esophageal reflux disease without esophagitis: Secondary | ICD-10-CM | POA: Diagnosis not present

## 2015-04-16 DIAGNOSIS — I10 Essential (primary) hypertension: Secondary | ICD-10-CM | POA: Diagnosis not present

## 2015-04-16 DIAGNOSIS — M5430 Sciatica, unspecified side: Secondary | ICD-10-CM | POA: Diagnosis not present

## 2015-04-17 DIAGNOSIS — I10 Essential (primary) hypertension: Secondary | ICD-10-CM | POA: Diagnosis not present

## 2015-04-17 DIAGNOSIS — M5430 Sciatica, unspecified side: Secondary | ICD-10-CM | POA: Diagnosis not present

## 2015-04-17 DIAGNOSIS — K219 Gastro-esophageal reflux disease without esophagitis: Secondary | ICD-10-CM | POA: Diagnosis not present

## 2015-04-18 DIAGNOSIS — M5430 Sciatica, unspecified side: Secondary | ICD-10-CM | POA: Diagnosis not present

## 2015-04-18 DIAGNOSIS — K219 Gastro-esophageal reflux disease without esophagitis: Secondary | ICD-10-CM | POA: Diagnosis not present

## 2015-04-18 DIAGNOSIS — I10 Essential (primary) hypertension: Secondary | ICD-10-CM | POA: Diagnosis not present

## 2015-04-19 DIAGNOSIS — K219 Gastro-esophageal reflux disease without esophagitis: Secondary | ICD-10-CM | POA: Diagnosis not present

## 2015-04-19 DIAGNOSIS — M5430 Sciatica, unspecified side: Secondary | ICD-10-CM | POA: Diagnosis not present

## 2015-04-19 DIAGNOSIS — I10 Essential (primary) hypertension: Secondary | ICD-10-CM | POA: Diagnosis not present

## 2015-04-20 DIAGNOSIS — K219 Gastro-esophageal reflux disease without esophagitis: Secondary | ICD-10-CM | POA: Diagnosis not present

## 2015-04-20 DIAGNOSIS — M5430 Sciatica, unspecified side: Secondary | ICD-10-CM | POA: Diagnosis not present

## 2015-04-20 DIAGNOSIS — I10 Essential (primary) hypertension: Secondary | ICD-10-CM | POA: Diagnosis not present

## 2015-04-21 DIAGNOSIS — I10 Essential (primary) hypertension: Secondary | ICD-10-CM | POA: Diagnosis not present

## 2015-04-21 DIAGNOSIS — M5430 Sciatica, unspecified side: Secondary | ICD-10-CM | POA: Diagnosis not present

## 2015-04-21 DIAGNOSIS — K219 Gastro-esophageal reflux disease without esophagitis: Secondary | ICD-10-CM | POA: Diagnosis not present

## 2015-04-22 DIAGNOSIS — K219 Gastro-esophageal reflux disease without esophagitis: Secondary | ICD-10-CM | POA: Diagnosis not present

## 2015-04-22 DIAGNOSIS — M5430 Sciatica, unspecified side: Secondary | ICD-10-CM | POA: Diagnosis not present

## 2015-04-22 DIAGNOSIS — I10 Essential (primary) hypertension: Secondary | ICD-10-CM | POA: Diagnosis not present

## 2015-04-23 DIAGNOSIS — M5430 Sciatica, unspecified side: Secondary | ICD-10-CM | POA: Diagnosis not present

## 2015-04-23 DIAGNOSIS — I10 Essential (primary) hypertension: Secondary | ICD-10-CM | POA: Diagnosis not present

## 2015-04-23 DIAGNOSIS — K219 Gastro-esophageal reflux disease without esophagitis: Secondary | ICD-10-CM | POA: Diagnosis not present

## 2015-04-24 DIAGNOSIS — M5430 Sciatica, unspecified side: Secondary | ICD-10-CM | POA: Diagnosis not present

## 2015-04-24 DIAGNOSIS — I10 Essential (primary) hypertension: Secondary | ICD-10-CM | POA: Diagnosis not present

## 2015-04-24 DIAGNOSIS — K219 Gastro-esophageal reflux disease without esophagitis: Secondary | ICD-10-CM | POA: Diagnosis not present

## 2015-04-25 DIAGNOSIS — M5430 Sciatica, unspecified side: Secondary | ICD-10-CM | POA: Diagnosis not present

## 2015-04-25 DIAGNOSIS — I10 Essential (primary) hypertension: Secondary | ICD-10-CM | POA: Diagnosis not present

## 2015-04-25 DIAGNOSIS — K219 Gastro-esophageal reflux disease without esophagitis: Secondary | ICD-10-CM | POA: Diagnosis not present

## 2015-04-26 ENCOUNTER — Emergency Department (HOSPITAL_COMMUNITY)
Admission: EM | Admit: 2015-04-26 | Discharge: 2015-04-26 | Disposition: A | Discharge status: Home / Self Care | Payer: Commercial Managed Care - HMO | Attending: Emergency Medicine | Admitting: Emergency Medicine

## 2015-04-26 ENCOUNTER — Encounter (HOSPITAL_COMMUNITY): Payer: Self-pay

## 2015-04-26 DIAGNOSIS — K219 Gastro-esophageal reflux disease without esophagitis: Secondary | ICD-10-CM | POA: Diagnosis not present

## 2015-04-26 DIAGNOSIS — Z79899 Other long term (current) drug therapy: Secondary | ICD-10-CM | POA: Diagnosis not present

## 2015-04-26 DIAGNOSIS — M5442 Lumbago with sciatica, left side: Secondary | ICD-10-CM | POA: Insufficient documentation

## 2015-04-26 DIAGNOSIS — F1010 Alcohol abuse, uncomplicated: Secondary | ICD-10-CM | POA: Diagnosis not present

## 2015-04-26 DIAGNOSIS — M5430 Sciatica, unspecified side: Secondary | ICD-10-CM | POA: Diagnosis not present

## 2015-04-26 DIAGNOSIS — I10 Essential (primary) hypertension: Secondary | ICD-10-CM | POA: Insufficient documentation

## 2015-04-26 DIAGNOSIS — G8929 Other chronic pain: Secondary | ICD-10-CM | POA: Diagnosis not present

## 2015-04-26 DIAGNOSIS — Y909 Presence of alcohol in blood, level not specified: Secondary | ICD-10-CM | POA: Diagnosis not present

## 2015-04-26 MED ORDER — IBUPROFEN 800 MG PO TABS
800.0000 mg | ORAL_TABLET | Freq: Once | ORAL | Status: AC
  Administered 2015-04-26: 800 mg via ORAL
  Filled 2015-04-26: qty 1

## 2015-04-26 MED ORDER — KETOROLAC TROMETHAMINE 60 MG/2ML IM SOLN: 30.0000 mg | Freq: Once | INTRAMUSCULAR | Status: DC
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
## 2015-04-26 NOTE — ED Notes (Signed)
Pt's sister reports that he has been accepted to [facility].

## 2015-04-26 NOTE — Discharge Instructions (Signed)
Alcohol Intoxication °Alcohol intoxication occurs when you drink enough alcohol that it affects your ability to function. It can be mild or very severe. Drinking a lot of alcohol in a short time is called binge drinking. This can be very harmful. Drinking alcohol can also be more dangerous if you are taking medicines or other drugs. Some of the effects caused by alcohol may include: °· Loss of coordination. °· Changes in mood and behavior. °· Unclear thinking. °· Trouble talking (slurred speech). °· Throwing up (vomiting). °· Confusion. °· Slowed breathing. °· Twitching and shaking (seizures). °· Loss of consciousness. °HOME CARE °· Do not drive after drinking alcohol. °· Drink enough water and fluids to keep your pee (urine) clear or pale yellow. Avoid caffeine. °· Only take medicine as told by your doctor. °GET HELP IF: °· You throw up (vomit) many times. °· You do not feel better after a few days. °· You frequently have alcohol intoxication. Your doctor can help decide if you should see a substance use treatment counselor. °GET HELP RIGHT AWAY IF: °· You become shaky when you stop drinking. °· You have twitching and shaking. °· You throw up blood. It may look bright red or like coffee grounds. °· You notice blood in your poop (bowel movements). °· You become lightheaded or pass out (faint). °MAKE SURE YOU:  °· Understand these instructions. °· Will watch your condition. °· Will get help right away if you are not doing well or get worse. °Document Released: 02/25/2008 Document Revised: 05/11/2013 Document Reviewed: 02/11/2013 °ExitCare® Patient Information ©2015 [employer], [employer]. This information is not intended to replace advice given to you by your health care provider. Make sure you discuss any questions you have with your health care provider. ° °

## 2015-04-26 NOTE — ED Notes (Signed)
Pt present want ETOH detox and back pain x 1 week.  Pain score 4/10.  Denies injury, GI, and GU complaints.  Sts he was "just sitting around" when pain started.  Pt has abused ETOH since the 90s and attempted rehab last year.  Pt did not have seizure complications during rehab process.  Sts he was only clean for a month.  Last drinks x 1 hour ago.  Sts "I had 3 [employer]."  Pt reports that he typically drink between a 5th and half gallon a day.  Denies SI/HI/AV.

## 2015-04-27 DIAGNOSIS — I10 Essential (primary) hypertension: Secondary | ICD-10-CM | POA: Diagnosis not present

## 2015-04-27 DIAGNOSIS — F1020 Alcohol dependence, uncomplicated: Secondary | ICD-10-CM | POA: Diagnosis not present

## 2015-04-27 DIAGNOSIS — R45851 Suicidal ideations: Secondary | ICD-10-CM | POA: Diagnosis not present

## 2015-04-27 DIAGNOSIS — J45909 Unspecified asthma, uncomplicated: Secondary | ICD-10-CM | POA: Diagnosis not present

## 2015-04-27 DIAGNOSIS — F319 Bipolar disorder, unspecified: Secondary | ICD-10-CM | POA: Diagnosis not present

## 2015-04-27 DIAGNOSIS — K219 Gastro-esophageal reflux disease without esophagitis: Secondary | ICD-10-CM | POA: Diagnosis not present

## 2015-04-27 DIAGNOSIS — F3181 Bipolar II disorder: Secondary | ICD-10-CM | POA: Diagnosis not present

## 2015-04-27 DIAGNOSIS — M5430 Sciatica, unspecified side: Secondary | ICD-10-CM | POA: Diagnosis not present

## 2015-04-27 DIAGNOSIS — R45850 Homicidal ideations: Secondary | ICD-10-CM | POA: Diagnosis not present

## 2015-04-28 DIAGNOSIS — M5430 Sciatica, unspecified side: Secondary | ICD-10-CM | POA: Diagnosis not present

## 2015-04-28 DIAGNOSIS — K219 Gastro-esophageal reflux disease without esophagitis: Secondary | ICD-10-CM | POA: Diagnosis not present

## 2015-04-28 DIAGNOSIS — I10 Essential (primary) hypertension: Secondary | ICD-10-CM | POA: Diagnosis not present

## 2015-04-29 DIAGNOSIS — M5430 Sciatica, unspecified side: Secondary | ICD-10-CM | POA: Diagnosis not present

## 2015-04-29 DIAGNOSIS — K219 Gastro-esophageal reflux disease without esophagitis: Secondary | ICD-10-CM | POA: Diagnosis not present

## 2015-04-29 DIAGNOSIS — I10 Essential (primary) hypertension: Secondary | ICD-10-CM | POA: Diagnosis not present

## 2015-04-30 DIAGNOSIS — I10 Essential (primary) hypertension: Secondary | ICD-10-CM | POA: Diagnosis not present

## 2015-04-30 DIAGNOSIS — K219 Gastro-esophageal reflux disease without esophagitis: Secondary | ICD-10-CM | POA: Diagnosis not present

## 2015-04-30 DIAGNOSIS — M5430 Sciatica, unspecified side: Secondary | ICD-10-CM | POA: Diagnosis not present

## 2015-05-01 DIAGNOSIS — M5430 Sciatica, unspecified side: Secondary | ICD-10-CM | POA: Diagnosis not present

## 2015-05-01 DIAGNOSIS — K219 Gastro-esophageal reflux disease without esophagitis: Secondary | ICD-10-CM | POA: Diagnosis not present

## 2015-05-01 DIAGNOSIS — I10 Essential (primary) hypertension: Secondary | ICD-10-CM | POA: Diagnosis not present

## 2015-05-02 DIAGNOSIS — K219 Gastro-esophageal reflux disease without esophagitis: Secondary | ICD-10-CM | POA: Diagnosis not present

## 2015-05-02 DIAGNOSIS — I10 Essential (primary) hypertension: Secondary | ICD-10-CM | POA: Diagnosis not present

## 2015-05-02 DIAGNOSIS — M5430 Sciatica, unspecified side: Secondary | ICD-10-CM | POA: Diagnosis not present

## 2015-05-03 DIAGNOSIS — I10 Essential (primary) hypertension: Secondary | ICD-10-CM | POA: Diagnosis not present

## 2015-05-03 DIAGNOSIS — M5430 Sciatica, unspecified side: Secondary | ICD-10-CM | POA: Diagnosis not present

## 2015-05-03 DIAGNOSIS — K219 Gastro-esophageal reflux disease without esophagitis: Secondary | ICD-10-CM | POA: Diagnosis not present

## 2015-05-04 DIAGNOSIS — I10 Essential (primary) hypertension: Secondary | ICD-10-CM | POA: Diagnosis not present

## 2015-05-04 DIAGNOSIS — K219 Gastro-esophageal reflux disease without esophagitis: Secondary | ICD-10-CM | POA: Diagnosis not present

## 2015-05-04 DIAGNOSIS — M5430 Sciatica, unspecified side: Secondary | ICD-10-CM | POA: Diagnosis not present

## 2015-05-05 DIAGNOSIS — M5430 Sciatica, unspecified side: Secondary | ICD-10-CM | POA: Diagnosis not present

## 2015-05-05 DIAGNOSIS — I10 Essential (primary) hypertension: Secondary | ICD-10-CM | POA: Diagnosis not present

## 2015-05-05 DIAGNOSIS — K219 Gastro-esophageal reflux disease without esophagitis: Secondary | ICD-10-CM | POA: Diagnosis not present

## 2015-05-06 DIAGNOSIS — K219 Gastro-esophageal reflux disease without esophagitis: Secondary | ICD-10-CM | POA: Diagnosis not present

## 2015-05-06 DIAGNOSIS — M5430 Sciatica, unspecified side: Secondary | ICD-10-CM | POA: Diagnosis not present

## 2015-05-06 DIAGNOSIS — I10 Essential (primary) hypertension: Secondary | ICD-10-CM | POA: Diagnosis not present

## 2015-05-07 DIAGNOSIS — I10 Essential (primary) hypertension: Secondary | ICD-10-CM | POA: Diagnosis not present

## 2015-05-07 DIAGNOSIS — K219 Gastro-esophageal reflux disease without esophagitis: Secondary | ICD-10-CM | POA: Diagnosis not present

## 2015-05-07 DIAGNOSIS — M5430 Sciatica, unspecified side: Secondary | ICD-10-CM | POA: Diagnosis not present

## 2015-05-08 DIAGNOSIS — M5430 Sciatica, unspecified side: Secondary | ICD-10-CM | POA: Diagnosis not present

## 2015-05-08 DIAGNOSIS — I10 Essential (primary) hypertension: Secondary | ICD-10-CM | POA: Diagnosis not present

## 2015-05-08 DIAGNOSIS — K219 Gastro-esophageal reflux disease without esophagitis: Secondary | ICD-10-CM | POA: Diagnosis not present

## 2015-05-09 DIAGNOSIS — I10 Essential (primary) hypertension: Secondary | ICD-10-CM | POA: Diagnosis not present

## 2015-05-09 DIAGNOSIS — M5430 Sciatica, unspecified side: Secondary | ICD-10-CM | POA: Diagnosis not present

## 2015-05-09 DIAGNOSIS — K219 Gastro-esophageal reflux disease without esophagitis: Secondary | ICD-10-CM | POA: Diagnosis not present

## 2015-05-10 DIAGNOSIS — K219 Gastro-esophageal reflux disease without esophagitis: Secondary | ICD-10-CM | POA: Diagnosis not present

## 2015-05-10 DIAGNOSIS — I10 Essential (primary) hypertension: Secondary | ICD-10-CM | POA: Diagnosis not present

## 2015-05-10 DIAGNOSIS — M5430 Sciatica, unspecified side: Secondary | ICD-10-CM | POA: Diagnosis not present

## 2015-05-11 DIAGNOSIS — M5430 Sciatica, unspecified side: Secondary | ICD-10-CM | POA: Diagnosis not present

## 2015-05-11 DIAGNOSIS — I10 Essential (primary) hypertension: Secondary | ICD-10-CM | POA: Diagnosis not present

## 2015-05-11 DIAGNOSIS — K219 Gastro-esophageal reflux disease without esophagitis: Secondary | ICD-10-CM | POA: Diagnosis not present

## 2015-05-12 DIAGNOSIS — I10 Essential (primary) hypertension: Secondary | ICD-10-CM | POA: Diagnosis not present

## 2015-05-12 DIAGNOSIS — K219 Gastro-esophageal reflux disease without esophagitis: Secondary | ICD-10-CM | POA: Diagnosis not present

## 2015-05-12 DIAGNOSIS — M5430 Sciatica, unspecified side: Secondary | ICD-10-CM | POA: Diagnosis not present

## 2015-05-13 DIAGNOSIS — K219 Gastro-esophageal reflux disease without esophagitis: Secondary | ICD-10-CM | POA: Diagnosis not present

## 2015-05-13 DIAGNOSIS — M5430 Sciatica, unspecified side: Secondary | ICD-10-CM | POA: Diagnosis not present

## 2015-05-13 DIAGNOSIS — I10 Essential (primary) hypertension: Secondary | ICD-10-CM | POA: Diagnosis not present

## 2015-05-14 DIAGNOSIS — K219 Gastro-esophageal reflux disease without esophagitis: Secondary | ICD-10-CM | POA: Diagnosis not present

## 2015-05-14 DIAGNOSIS — I10 Essential (primary) hypertension: Secondary | ICD-10-CM | POA: Diagnosis not present

## 2015-05-14 DIAGNOSIS — M5430 Sciatica, unspecified side: Secondary | ICD-10-CM | POA: Diagnosis not present

## 2015-05-15 DIAGNOSIS — M5430 Sciatica, unspecified side: Secondary | ICD-10-CM | POA: Diagnosis not present

## 2015-05-15 DIAGNOSIS — I10 Essential (primary) hypertension: Secondary | ICD-10-CM | POA: Diagnosis not present

## 2015-05-15 DIAGNOSIS — K219 Gastro-esophageal reflux disease without esophagitis: Secondary | ICD-10-CM | POA: Diagnosis not present

## 2015-05-16 DIAGNOSIS — M5430 Sciatica, unspecified side: Secondary | ICD-10-CM | POA: Diagnosis not present

## 2015-05-16 DIAGNOSIS — I10 Essential (primary) hypertension: Secondary | ICD-10-CM | POA: Diagnosis not present

## 2015-05-16 DIAGNOSIS — K219 Gastro-esophageal reflux disease without esophagitis: Secondary | ICD-10-CM | POA: Diagnosis not present

## 2015-05-17 DIAGNOSIS — K219 Gastro-esophageal reflux disease without esophagitis: Secondary | ICD-10-CM | POA: Diagnosis not present

## 2015-05-17 DIAGNOSIS — I10 Essential (primary) hypertension: Secondary | ICD-10-CM | POA: Diagnosis not present

## 2015-05-17 DIAGNOSIS — M5430 Sciatica, unspecified side: Secondary | ICD-10-CM | POA: Diagnosis not present

## 2015-05-18 DIAGNOSIS — M5430 Sciatica, unspecified side: Secondary | ICD-10-CM | POA: Diagnosis not present

## 2015-05-18 DIAGNOSIS — K219 Gastro-esophageal reflux disease without esophagitis: Secondary | ICD-10-CM | POA: Diagnosis not present

## 2015-05-18 DIAGNOSIS — I10 Essential (primary) hypertension: Secondary | ICD-10-CM | POA: Diagnosis not present

## 2015-05-19 DIAGNOSIS — I10 Essential (primary) hypertension: Secondary | ICD-10-CM | POA: Diagnosis not present

## 2015-05-19 DIAGNOSIS — K219 Gastro-esophageal reflux disease without esophagitis: Secondary | ICD-10-CM | POA: Diagnosis not present

## 2015-05-19 DIAGNOSIS — M5430 Sciatica, unspecified side: Secondary | ICD-10-CM | POA: Diagnosis not present

## 2015-05-20 DIAGNOSIS — I10 Essential (primary) hypertension: Secondary | ICD-10-CM | POA: Diagnosis not present

## 2015-05-20 DIAGNOSIS — M5430 Sciatica, unspecified side: Secondary | ICD-10-CM | POA: Diagnosis not present

## 2015-05-20 DIAGNOSIS — K219 Gastro-esophageal reflux disease without esophagitis: Secondary | ICD-10-CM | POA: Diagnosis not present

## 2015-05-21 DIAGNOSIS — I10 Essential (primary) hypertension: Secondary | ICD-10-CM | POA: Diagnosis not present

## 2015-05-21 DIAGNOSIS — M5430 Sciatica, unspecified side: Secondary | ICD-10-CM | POA: Diagnosis not present

## 2015-05-21 DIAGNOSIS — K219 Gastro-esophageal reflux disease without esophagitis: Secondary | ICD-10-CM | POA: Diagnosis not present

## 2015-05-22 DIAGNOSIS — K219 Gastro-esophageal reflux disease without esophagitis: Secondary | ICD-10-CM | POA: Diagnosis not present

## 2015-05-22 DIAGNOSIS — I10 Essential (primary) hypertension: Secondary | ICD-10-CM | POA: Diagnosis not present

## 2015-05-22 DIAGNOSIS — M5430 Sciatica, unspecified side: Secondary | ICD-10-CM | POA: Diagnosis not present

## 2015-05-23 DIAGNOSIS — I10 Essential (primary) hypertension: Secondary | ICD-10-CM | POA: Diagnosis not present

## 2015-05-23 DIAGNOSIS — K219 Gastro-esophageal reflux disease without esophagitis: Secondary | ICD-10-CM | POA: Diagnosis not present

## 2015-05-23 DIAGNOSIS — M5430 Sciatica, unspecified side: Secondary | ICD-10-CM | POA: Diagnosis not present

## 2015-05-24 DIAGNOSIS — M5430 Sciatica, unspecified side: Secondary | ICD-10-CM | POA: Diagnosis not present

## 2015-05-24 DIAGNOSIS — I10 Essential (primary) hypertension: Secondary | ICD-10-CM | POA: Diagnosis not present

## 2015-05-24 DIAGNOSIS — K219 Gastro-esophageal reflux disease without esophagitis: Secondary | ICD-10-CM | POA: Diagnosis not present

## 2015-05-24 IMAGING — DX DG CHEST 2V
3 series · 3 of 3 positions shown · non-contrast
Comparison: February 16, 2013

CLINICAL DATA: Difficulty breathing

EXAM:
CHEST  2 VIEW

[chest pa]
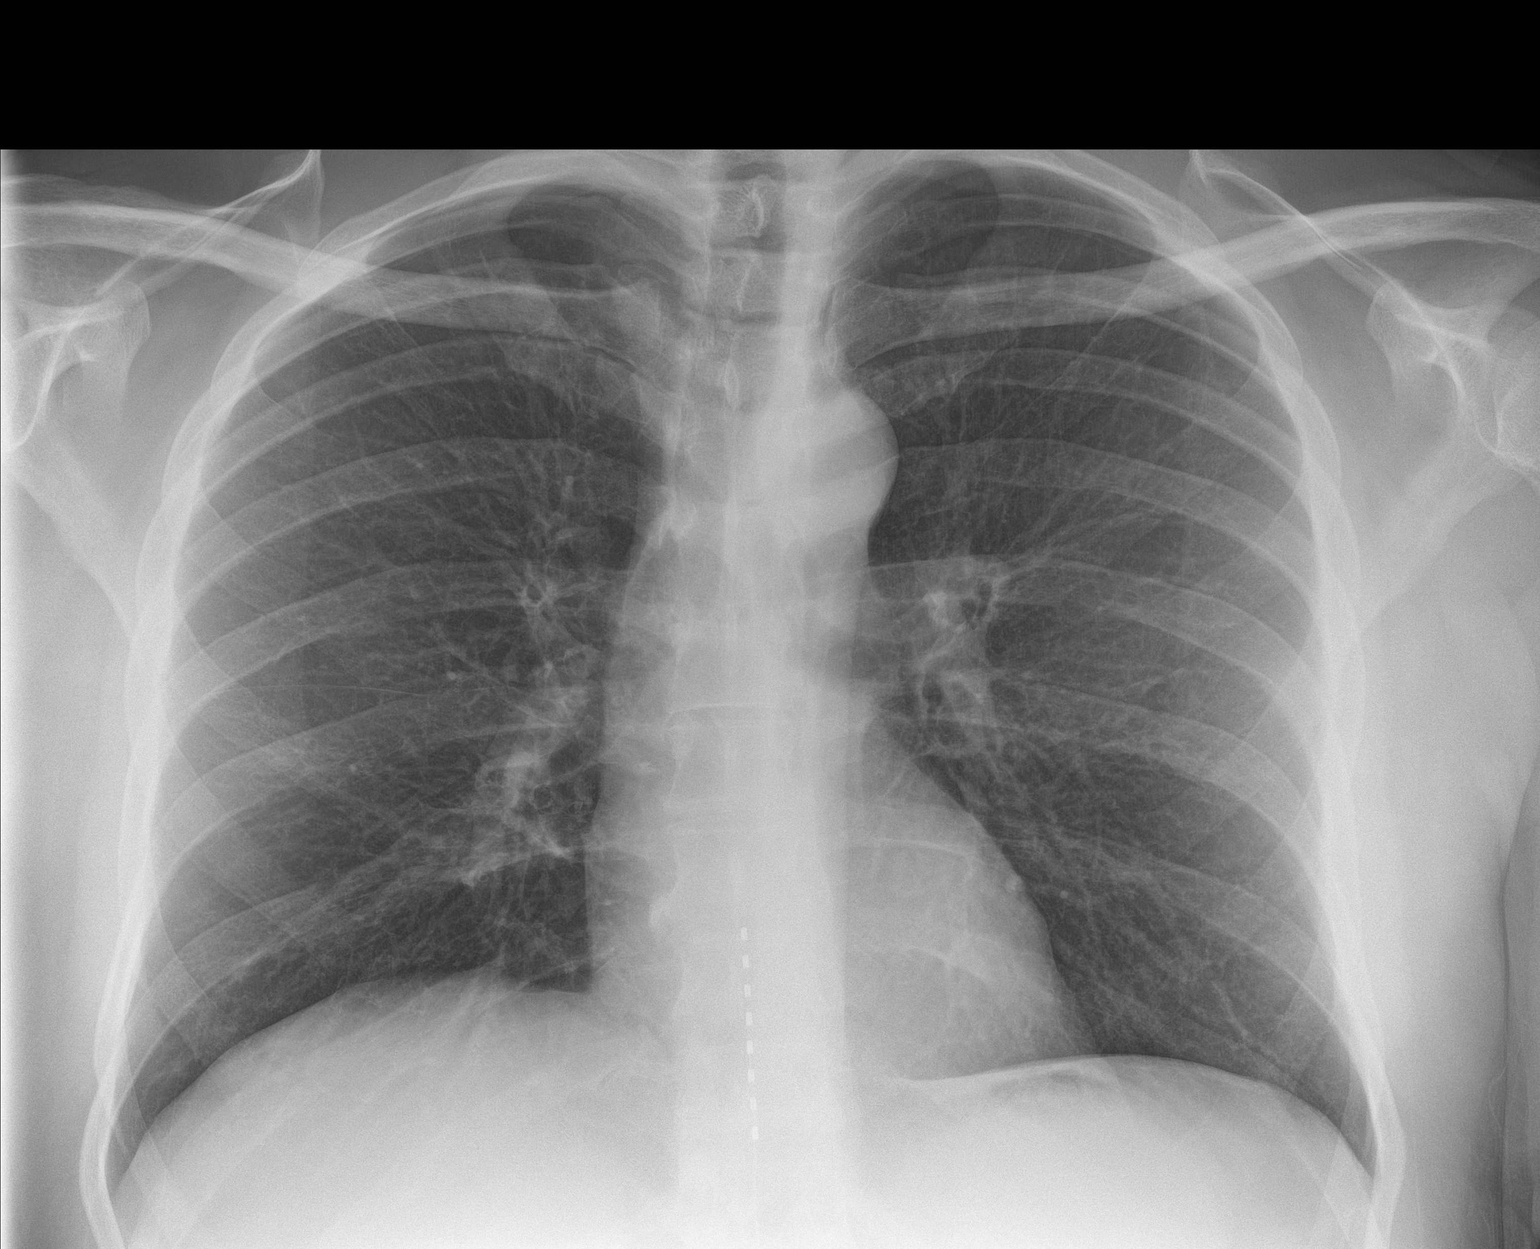

[chest lat (1 of 2)]
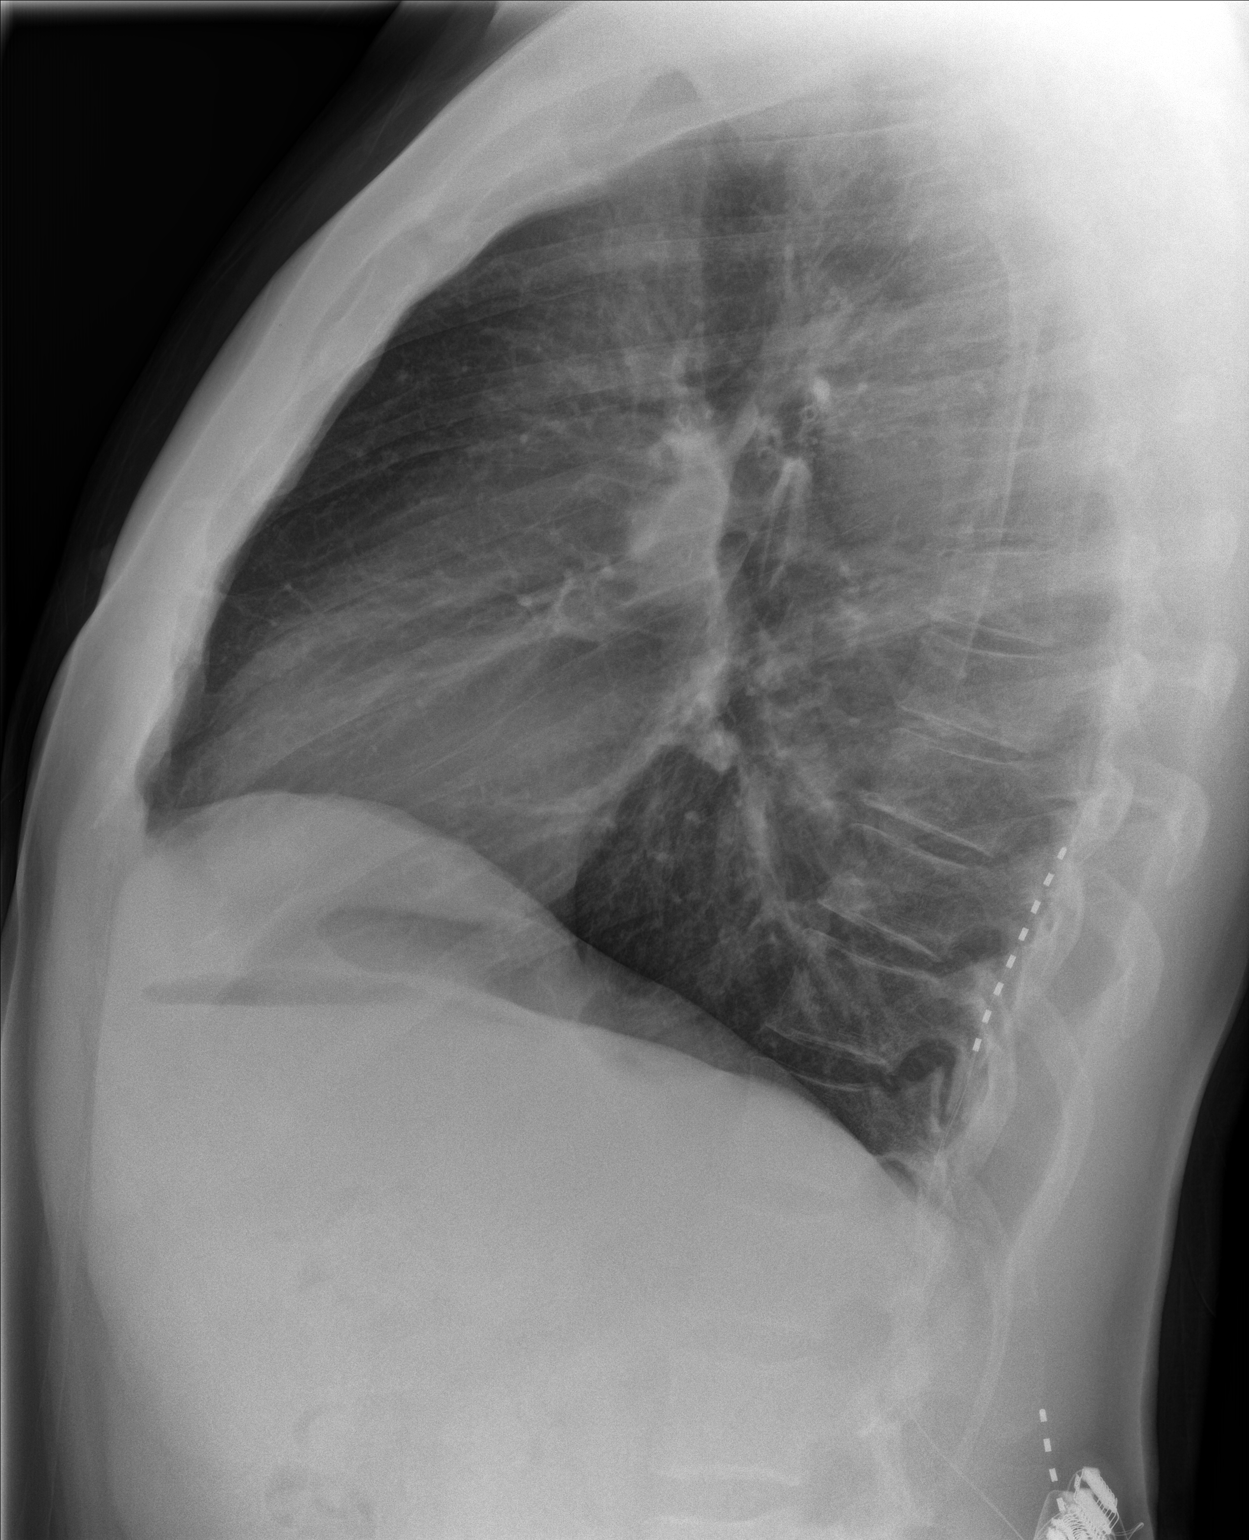

[chest lat (2 of 2)]
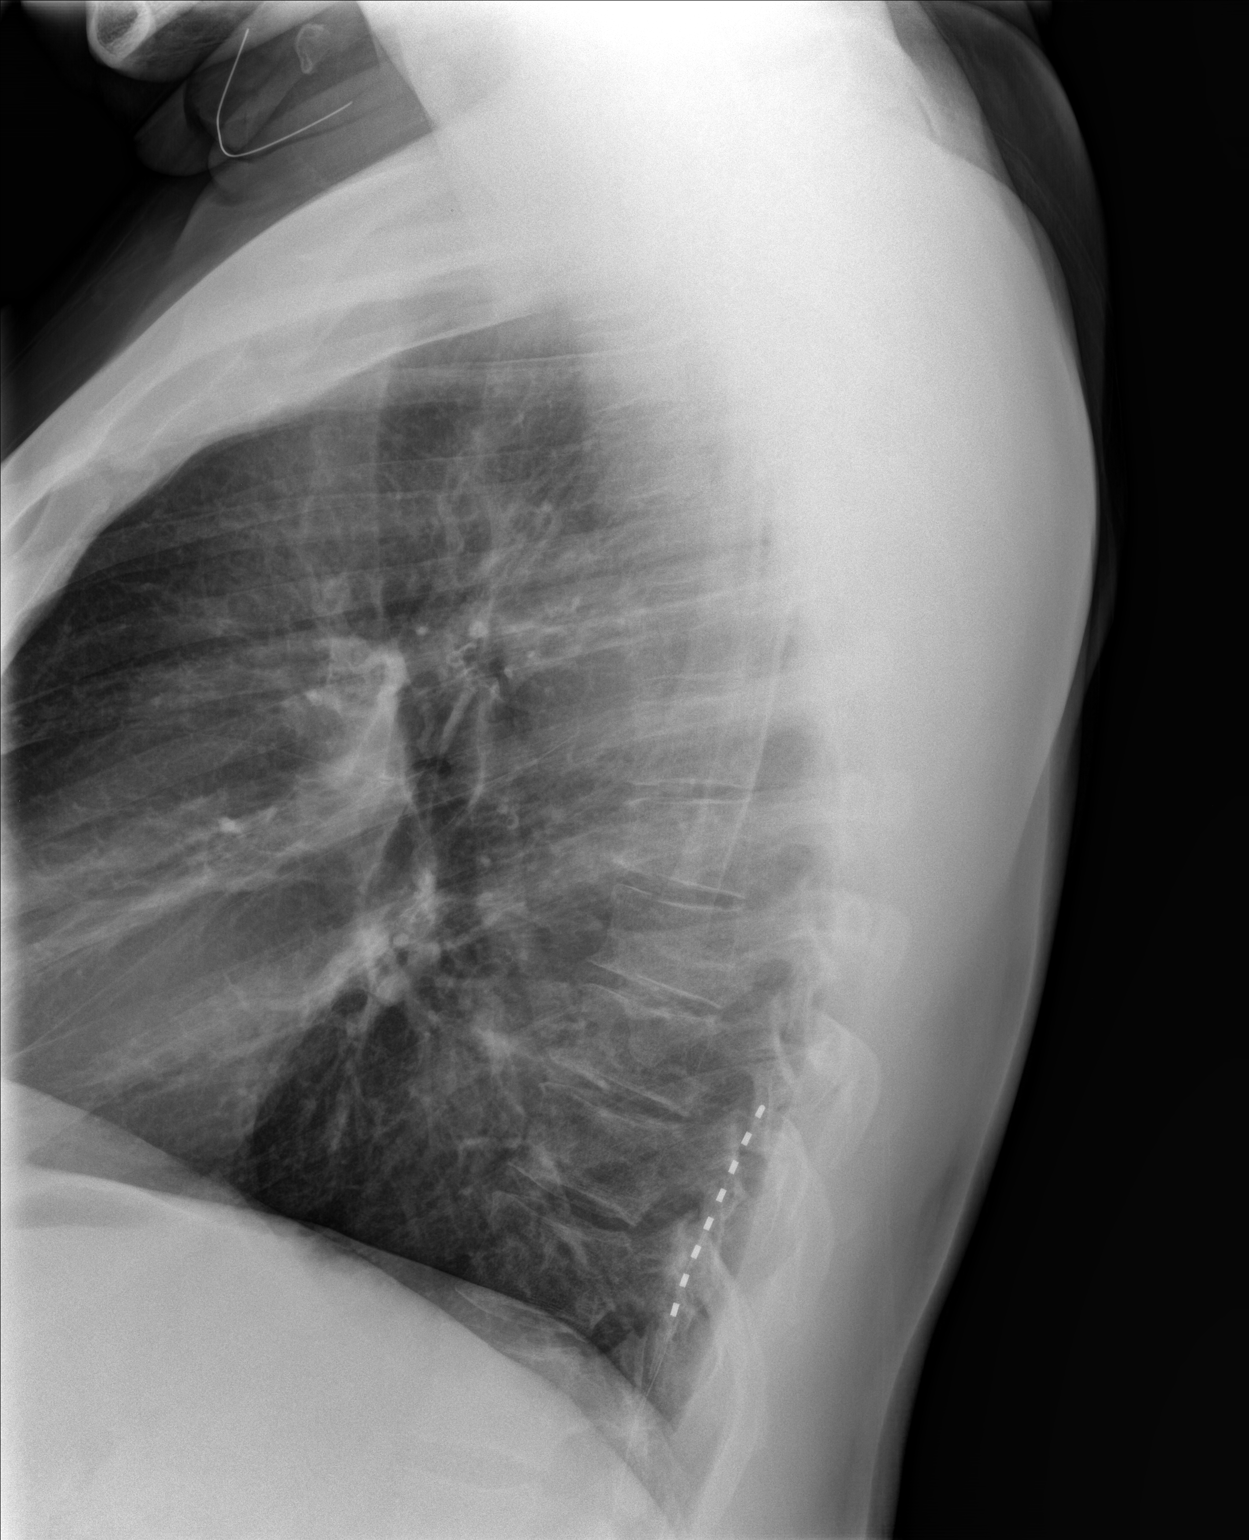

[3 of 3 positions shown; findings below may reference images not displayed]

FINDINGS: Thoracic stimulator tip is in the lower thoracic region, stable.
Lungs are clear. Heart size and pulmonary vascularity are normal. No
adenopathy. No bone lesions.
IMPRESSION: No edema or consolidation.

## 2015-05-25 DIAGNOSIS — M5430 Sciatica, unspecified side: Secondary | ICD-10-CM | POA: Diagnosis not present

## 2015-05-25 DIAGNOSIS — K219 Gastro-esophageal reflux disease without esophagitis: Secondary | ICD-10-CM | POA: Diagnosis not present

## 2015-05-25 DIAGNOSIS — I10 Essential (primary) hypertension: Secondary | ICD-10-CM | POA: Diagnosis not present

## 2015-05-26 DIAGNOSIS — K219 Gastro-esophageal reflux disease without esophagitis: Secondary | ICD-10-CM | POA: Diagnosis not present

## 2015-05-26 DIAGNOSIS — M5430 Sciatica, unspecified side: Secondary | ICD-10-CM | POA: Diagnosis not present

## 2015-05-26 DIAGNOSIS — I10 Essential (primary) hypertension: Secondary | ICD-10-CM | POA: Diagnosis not present

## 2015-05-27 DIAGNOSIS — I10 Essential (primary) hypertension: Secondary | ICD-10-CM | POA: Diagnosis not present

## 2015-05-27 DIAGNOSIS — M5430 Sciatica, unspecified side: Secondary | ICD-10-CM | POA: Diagnosis not present

## 2015-05-27 DIAGNOSIS — K219 Gastro-esophageal reflux disease without esophagitis: Secondary | ICD-10-CM | POA: Diagnosis not present

## 2015-05-29 DIAGNOSIS — M5430 Sciatica, unspecified side: Secondary | ICD-10-CM | POA: Diagnosis not present

## 2015-05-29 DIAGNOSIS — K219 Gastro-esophageal reflux disease without esophagitis: Secondary | ICD-10-CM | POA: Diagnosis not present

## 2015-05-29 DIAGNOSIS — I10 Essential (primary) hypertension: Secondary | ICD-10-CM | POA: Diagnosis not present

## 2015-05-30 DIAGNOSIS — I10 Essential (primary) hypertension: Secondary | ICD-10-CM | POA: Diagnosis not present

## 2015-05-30 DIAGNOSIS — M5430 Sciatica, unspecified side: Secondary | ICD-10-CM | POA: Diagnosis not present

## 2015-05-30 DIAGNOSIS — K219 Gastro-esophageal reflux disease without esophagitis: Secondary | ICD-10-CM | POA: Diagnosis not present

## 2015-05-31 DIAGNOSIS — K219 Gastro-esophageal reflux disease without esophagitis: Secondary | ICD-10-CM | POA: Diagnosis not present

## 2015-05-31 DIAGNOSIS — M5430 Sciatica, unspecified side: Secondary | ICD-10-CM | POA: Diagnosis not present

## 2015-05-31 DIAGNOSIS — I10 Essential (primary) hypertension: Secondary | ICD-10-CM | POA: Diagnosis not present

## 2015-06-01 DIAGNOSIS — K219 Gastro-esophageal reflux disease without esophagitis: Secondary | ICD-10-CM | POA: Diagnosis not present

## 2015-06-01 DIAGNOSIS — I10 Essential (primary) hypertension: Secondary | ICD-10-CM | POA: Diagnosis not present

## 2015-06-01 DIAGNOSIS — M5430 Sciatica, unspecified side: Secondary | ICD-10-CM | POA: Diagnosis not present

## 2015-06-02 DIAGNOSIS — I10 Essential (primary) hypertension: Secondary | ICD-10-CM | POA: Diagnosis not present

## 2015-06-02 DIAGNOSIS — M5430 Sciatica, unspecified side: Secondary | ICD-10-CM | POA: Diagnosis not present

## 2015-06-02 DIAGNOSIS — K219 Gastro-esophageal reflux disease without esophagitis: Secondary | ICD-10-CM | POA: Diagnosis not present

## 2015-06-03 DIAGNOSIS — I10 Essential (primary) hypertension: Secondary | ICD-10-CM | POA: Diagnosis not present

## 2015-06-03 DIAGNOSIS — K219 Gastro-esophageal reflux disease without esophagitis: Secondary | ICD-10-CM | POA: Diagnosis not present

## 2015-06-03 DIAGNOSIS — M5430 Sciatica, unspecified side: Secondary | ICD-10-CM | POA: Diagnosis not present

## 2015-06-04 DIAGNOSIS — K219 Gastro-esophageal reflux disease without esophagitis: Secondary | ICD-10-CM | POA: Diagnosis not present

## 2015-06-04 DIAGNOSIS — M5430 Sciatica, unspecified side: Secondary | ICD-10-CM | POA: Diagnosis not present

## 2015-06-04 DIAGNOSIS — I10 Essential (primary) hypertension: Secondary | ICD-10-CM | POA: Diagnosis not present

## 2015-06-05 DIAGNOSIS — K219 Gastro-esophageal reflux disease without esophagitis: Secondary | ICD-10-CM | POA: Diagnosis not present

## 2015-06-05 DIAGNOSIS — I10 Essential (primary) hypertension: Secondary | ICD-10-CM | POA: Diagnosis not present

## 2015-06-05 DIAGNOSIS — M5430 Sciatica, unspecified side: Secondary | ICD-10-CM | POA: Diagnosis not present

## 2015-06-06 DIAGNOSIS — K219 Gastro-esophageal reflux disease without esophagitis: Secondary | ICD-10-CM | POA: Diagnosis not present

## 2015-06-06 DIAGNOSIS — I10 Essential (primary) hypertension: Secondary | ICD-10-CM | POA: Diagnosis not present

## 2015-06-06 DIAGNOSIS — M5430 Sciatica, unspecified side: Secondary | ICD-10-CM | POA: Diagnosis not present

## 2015-06-07 DIAGNOSIS — I10 Essential (primary) hypertension: Secondary | ICD-10-CM | POA: Diagnosis not present

## 2015-06-07 DIAGNOSIS — K219 Gastro-esophageal reflux disease without esophagitis: Secondary | ICD-10-CM | POA: Diagnosis not present

## 2015-06-07 DIAGNOSIS — M5430 Sciatica, unspecified side: Secondary | ICD-10-CM | POA: Diagnosis not present

## 2015-06-08 DIAGNOSIS — K219 Gastro-esophageal reflux disease without esophagitis: Secondary | ICD-10-CM | POA: Diagnosis not present

## 2015-06-08 DIAGNOSIS — M5430 Sciatica, unspecified side: Secondary | ICD-10-CM | POA: Diagnosis not present

## 2015-06-08 DIAGNOSIS — I10 Essential (primary) hypertension: Secondary | ICD-10-CM | POA: Diagnosis not present

## 2015-06-09 DIAGNOSIS — I10 Essential (primary) hypertension: Secondary | ICD-10-CM | POA: Diagnosis not present

## 2015-06-09 DIAGNOSIS — K219 Gastro-esophageal reflux disease without esophagitis: Secondary | ICD-10-CM | POA: Diagnosis not present

## 2015-06-09 DIAGNOSIS — M5430 Sciatica, unspecified side: Secondary | ICD-10-CM | POA: Diagnosis not present

## 2015-06-10 DIAGNOSIS — I10 Essential (primary) hypertension: Secondary | ICD-10-CM | POA: Diagnosis not present

## 2015-06-10 DIAGNOSIS — M5430 Sciatica, unspecified side: Secondary | ICD-10-CM | POA: Diagnosis not present

## 2015-06-10 DIAGNOSIS — K219 Gastro-esophageal reflux disease without esophagitis: Secondary | ICD-10-CM | POA: Diagnosis not present

## 2015-06-11 DIAGNOSIS — M5430 Sciatica, unspecified side: Secondary | ICD-10-CM | POA: Diagnosis not present

## 2015-06-11 DIAGNOSIS — I10 Essential (primary) hypertension: Secondary | ICD-10-CM | POA: Diagnosis not present

## 2015-06-11 DIAGNOSIS — K219 Gastro-esophageal reflux disease without esophagitis: Secondary | ICD-10-CM | POA: Diagnosis not present

## 2015-06-12 DIAGNOSIS — K219 Gastro-esophageal reflux disease without esophagitis: Secondary | ICD-10-CM | POA: Diagnosis not present

## 2015-06-12 DIAGNOSIS — I10 Essential (primary) hypertension: Secondary | ICD-10-CM | POA: Diagnosis not present

## 2015-06-12 DIAGNOSIS — M5430 Sciatica, unspecified side: Secondary | ICD-10-CM | POA: Diagnosis not present

## 2015-06-13 DIAGNOSIS — M5430 Sciatica, unspecified side: Secondary | ICD-10-CM | POA: Diagnosis not present

## 2015-06-13 DIAGNOSIS — I10 Essential (primary) hypertension: Secondary | ICD-10-CM | POA: Diagnosis not present

## 2015-06-13 DIAGNOSIS — K219 Gastro-esophageal reflux disease without esophagitis: Secondary | ICD-10-CM | POA: Diagnosis not present

## 2015-06-14 DIAGNOSIS — K219 Gastro-esophageal reflux disease without esophagitis: Secondary | ICD-10-CM | POA: Diagnosis not present

## 2015-06-14 DIAGNOSIS — M5430 Sciatica, unspecified side: Secondary | ICD-10-CM | POA: Diagnosis not present

## 2015-06-14 DIAGNOSIS — I10 Essential (primary) hypertension: Secondary | ICD-10-CM | POA: Diagnosis not present

## 2015-06-15 DIAGNOSIS — M5430 Sciatica, unspecified side: Secondary | ICD-10-CM | POA: Diagnosis not present

## 2015-06-15 DIAGNOSIS — K219 Gastro-esophageal reflux disease without esophagitis: Secondary | ICD-10-CM | POA: Diagnosis not present

## 2015-06-15 DIAGNOSIS — I10 Essential (primary) hypertension: Secondary | ICD-10-CM | POA: Diagnosis not present

## 2015-06-16 DIAGNOSIS — K219 Gastro-esophageal reflux disease without esophagitis: Secondary | ICD-10-CM | POA: Diagnosis not present

## 2015-06-16 DIAGNOSIS — M5430 Sciatica, unspecified side: Secondary | ICD-10-CM | POA: Diagnosis not present

## 2015-06-16 DIAGNOSIS — I10 Essential (primary) hypertension: Secondary | ICD-10-CM | POA: Diagnosis not present

## 2015-06-17 DIAGNOSIS — M5430 Sciatica, unspecified side: Secondary | ICD-10-CM | POA: Diagnosis not present

## 2015-06-17 DIAGNOSIS — K219 Gastro-esophageal reflux disease without esophagitis: Secondary | ICD-10-CM | POA: Diagnosis not present

## 2015-06-17 DIAGNOSIS — I10 Essential (primary) hypertension: Secondary | ICD-10-CM | POA: Diagnosis not present

## 2015-06-18 DIAGNOSIS — K219 Gastro-esophageal reflux disease without esophagitis: Secondary | ICD-10-CM | POA: Diagnosis not present

## 2015-06-18 DIAGNOSIS — M5430 Sciatica, unspecified side: Secondary | ICD-10-CM | POA: Diagnosis not present

## 2015-06-18 DIAGNOSIS — I10 Essential (primary) hypertension: Secondary | ICD-10-CM | POA: Diagnosis not present

## 2015-06-19 DIAGNOSIS — I10 Essential (primary) hypertension: Secondary | ICD-10-CM | POA: Diagnosis not present

## 2015-06-19 DIAGNOSIS — K219 Gastro-esophageal reflux disease without esophagitis: Secondary | ICD-10-CM | POA: Diagnosis not present

## 2015-06-19 DIAGNOSIS — M5430 Sciatica, unspecified side: Secondary | ICD-10-CM | POA: Diagnosis not present

## 2015-06-20 DIAGNOSIS — M5430 Sciatica, unspecified side: Secondary | ICD-10-CM | POA: Diagnosis not present

## 2015-06-20 DIAGNOSIS — K219 Gastro-esophageal reflux disease without esophagitis: Secondary | ICD-10-CM | POA: Diagnosis not present

## 2015-06-20 DIAGNOSIS — I10 Essential (primary) hypertension: Secondary | ICD-10-CM | POA: Diagnosis not present

## 2015-06-21 DIAGNOSIS — M5430 Sciatica, unspecified side: Secondary | ICD-10-CM | POA: Diagnosis not present

## 2015-06-21 DIAGNOSIS — I10 Essential (primary) hypertension: Secondary | ICD-10-CM | POA: Diagnosis not present

## 2015-06-21 DIAGNOSIS — K219 Gastro-esophageal reflux disease without esophagitis: Secondary | ICD-10-CM | POA: Diagnosis not present

## 2015-06-22 DIAGNOSIS — K219 Gastro-esophageal reflux disease without esophagitis: Secondary | ICD-10-CM | POA: Diagnosis not present

## 2015-06-22 DIAGNOSIS — I10 Essential (primary) hypertension: Secondary | ICD-10-CM | POA: Diagnosis not present

## 2015-06-22 DIAGNOSIS — M5430 Sciatica, unspecified side: Secondary | ICD-10-CM | POA: Diagnosis not present

## 2015-06-23 DIAGNOSIS — M5430 Sciatica, unspecified side: Secondary | ICD-10-CM | POA: Diagnosis not present

## 2015-06-23 DIAGNOSIS — I10 Essential (primary) hypertension: Secondary | ICD-10-CM | POA: Diagnosis not present

## 2015-06-23 DIAGNOSIS — K219 Gastro-esophageal reflux disease without esophagitis: Secondary | ICD-10-CM | POA: Diagnosis not present

## 2015-06-24 DIAGNOSIS — I10 Essential (primary) hypertension: Secondary | ICD-10-CM | POA: Diagnosis not present

## 2015-06-24 DIAGNOSIS — K219 Gastro-esophageal reflux disease without esophagitis: Secondary | ICD-10-CM | POA: Diagnosis not present

## 2015-06-24 DIAGNOSIS — M5430 Sciatica, unspecified side: Secondary | ICD-10-CM | POA: Diagnosis not present

## 2015-06-25 DIAGNOSIS — M5430 Sciatica, unspecified side: Secondary | ICD-10-CM | POA: Diagnosis not present

## 2015-06-25 DIAGNOSIS — I10 Essential (primary) hypertension: Secondary | ICD-10-CM | POA: Diagnosis not present

## 2015-06-25 DIAGNOSIS — K219 Gastro-esophageal reflux disease without esophagitis: Secondary | ICD-10-CM | POA: Diagnosis not present

## 2015-06-26 DIAGNOSIS — I10 Essential (primary) hypertension: Secondary | ICD-10-CM | POA: Diagnosis not present

## 2015-06-26 DIAGNOSIS — M5430 Sciatica, unspecified side: Secondary | ICD-10-CM | POA: Diagnosis not present

## 2015-06-26 DIAGNOSIS — K219 Gastro-esophageal reflux disease without esophagitis: Secondary | ICD-10-CM | POA: Diagnosis not present

## 2015-06-27 DIAGNOSIS — M5430 Sciatica, unspecified side: Secondary | ICD-10-CM | POA: Diagnosis not present

## 2015-06-27 DIAGNOSIS — I10 Essential (primary) hypertension: Secondary | ICD-10-CM | POA: Diagnosis not present

## 2015-06-27 DIAGNOSIS — K219 Gastro-esophageal reflux disease without esophagitis: Secondary | ICD-10-CM | POA: Diagnosis not present

## 2015-06-28 DIAGNOSIS — M5430 Sciatica, unspecified side: Secondary | ICD-10-CM | POA: Diagnosis not present

## 2015-06-28 DIAGNOSIS — K219 Gastro-esophageal reflux disease without esophagitis: Secondary | ICD-10-CM | POA: Diagnosis not present

## 2015-06-28 DIAGNOSIS — I10 Essential (primary) hypertension: Secondary | ICD-10-CM | POA: Diagnosis not present

## 2015-06-29 DIAGNOSIS — M5430 Sciatica, unspecified side: Secondary | ICD-10-CM | POA: Diagnosis not present

## 2015-06-29 DIAGNOSIS — I10 Essential (primary) hypertension: Secondary | ICD-10-CM | POA: Diagnosis not present

## 2015-06-29 DIAGNOSIS — K219 Gastro-esophageal reflux disease without esophagitis: Secondary | ICD-10-CM | POA: Diagnosis not present

## 2015-06-30 DIAGNOSIS — M5430 Sciatica, unspecified side: Secondary | ICD-10-CM | POA: Diagnosis not present

## 2015-06-30 DIAGNOSIS — I10 Essential (primary) hypertension: Secondary | ICD-10-CM | POA: Diagnosis not present

## 2015-06-30 DIAGNOSIS — K219 Gastro-esophageal reflux disease without esophagitis: Secondary | ICD-10-CM | POA: Diagnosis not present

## 2015-07-01 DIAGNOSIS — M5430 Sciatica, unspecified side: Secondary | ICD-10-CM | POA: Diagnosis not present

## 2015-07-01 DIAGNOSIS — I10 Essential (primary) hypertension: Secondary | ICD-10-CM | POA: Diagnosis not present

## 2015-07-01 DIAGNOSIS — K219 Gastro-esophageal reflux disease without esophagitis: Secondary | ICD-10-CM | POA: Diagnosis not present

## 2015-07-02 DIAGNOSIS — K219 Gastro-esophageal reflux disease without esophagitis: Secondary | ICD-10-CM | POA: Diagnosis not present

## 2015-07-02 DIAGNOSIS — M5430 Sciatica, unspecified side: Secondary | ICD-10-CM | POA: Diagnosis not present

## 2015-07-02 DIAGNOSIS — I10 Essential (primary) hypertension: Secondary | ICD-10-CM | POA: Diagnosis not present

## 2015-07-03 DIAGNOSIS — I10 Essential (primary) hypertension: Secondary | ICD-10-CM | POA: Diagnosis not present

## 2015-07-03 DIAGNOSIS — K219 Gastro-esophageal reflux disease without esophagitis: Secondary | ICD-10-CM | POA: Diagnosis not present

## 2015-07-03 DIAGNOSIS — M5430 Sciatica, unspecified side: Secondary | ICD-10-CM | POA: Diagnosis not present

## 2015-07-04 DIAGNOSIS — M5430 Sciatica, unspecified side: Secondary | ICD-10-CM | POA: Diagnosis not present

## 2015-07-04 DIAGNOSIS — K219 Gastro-esophageal reflux disease without esophagitis: Secondary | ICD-10-CM | POA: Diagnosis not present

## 2015-07-04 DIAGNOSIS — I10 Essential (primary) hypertension: Secondary | ICD-10-CM | POA: Diagnosis not present

## 2015-07-05 DIAGNOSIS — M5430 Sciatica, unspecified side: Secondary | ICD-10-CM | POA: Diagnosis not present

## 2015-07-05 DIAGNOSIS — K219 Gastro-esophageal reflux disease without esophagitis: Secondary | ICD-10-CM | POA: Diagnosis not present

## 2015-07-05 DIAGNOSIS — I10 Essential (primary) hypertension: Secondary | ICD-10-CM | POA: Diagnosis not present

## 2015-07-06 DIAGNOSIS — I10 Essential (primary) hypertension: Secondary | ICD-10-CM | POA: Diagnosis not present

## 2015-07-06 DIAGNOSIS — M5430 Sciatica, unspecified side: Secondary | ICD-10-CM | POA: Diagnosis not present

## 2015-07-06 DIAGNOSIS — K219 Gastro-esophageal reflux disease without esophagitis: Secondary | ICD-10-CM | POA: Diagnosis not present

## 2015-07-07 DIAGNOSIS — M5430 Sciatica, unspecified side: Secondary | ICD-10-CM | POA: Diagnosis not present

## 2015-07-07 DIAGNOSIS — K219 Gastro-esophageal reflux disease without esophagitis: Secondary | ICD-10-CM | POA: Diagnosis not present

## 2015-07-07 DIAGNOSIS — I10 Essential (primary) hypertension: Secondary | ICD-10-CM | POA: Diagnosis not present

## 2015-07-08 DIAGNOSIS — M5430 Sciatica, unspecified side: Secondary | ICD-10-CM | POA: Diagnosis not present

## 2015-07-08 DIAGNOSIS — K219 Gastro-esophageal reflux disease without esophagitis: Secondary | ICD-10-CM | POA: Diagnosis not present

## 2015-07-08 DIAGNOSIS — I10 Essential (primary) hypertension: Secondary | ICD-10-CM | POA: Diagnosis not present

## 2015-07-09 DIAGNOSIS — I10 Essential (primary) hypertension: Secondary | ICD-10-CM | POA: Diagnosis not present

## 2015-07-09 DIAGNOSIS — M5430 Sciatica, unspecified side: Secondary | ICD-10-CM | POA: Diagnosis not present

## 2015-07-09 DIAGNOSIS — K219 Gastro-esophageal reflux disease without esophagitis: Secondary | ICD-10-CM | POA: Diagnosis not present

## 2015-07-10 DIAGNOSIS — I10 Essential (primary) hypertension: Secondary | ICD-10-CM | POA: Diagnosis not present

## 2015-07-10 DIAGNOSIS — M5430 Sciatica, unspecified side: Secondary | ICD-10-CM | POA: Diagnosis not present

## 2015-07-10 DIAGNOSIS — K219 Gastro-esophageal reflux disease without esophagitis: Secondary | ICD-10-CM | POA: Diagnosis not present

## 2015-07-11 DIAGNOSIS — K219 Gastro-esophageal reflux disease without esophagitis: Secondary | ICD-10-CM | POA: Diagnosis not present

## 2015-07-11 DIAGNOSIS — I10 Essential (primary) hypertension: Secondary | ICD-10-CM | POA: Diagnosis not present

## 2015-07-11 DIAGNOSIS — M5430 Sciatica, unspecified side: Secondary | ICD-10-CM | POA: Diagnosis not present

## 2015-07-12 DIAGNOSIS — M5430 Sciatica, unspecified side: Secondary | ICD-10-CM | POA: Diagnosis not present

## 2015-07-12 DIAGNOSIS — K219 Gastro-esophageal reflux disease without esophagitis: Secondary | ICD-10-CM | POA: Diagnosis not present

## 2015-07-12 DIAGNOSIS — I10 Essential (primary) hypertension: Secondary | ICD-10-CM | POA: Diagnosis not present

## 2015-07-13 DIAGNOSIS — K219 Gastro-esophageal reflux disease without esophagitis: Secondary | ICD-10-CM | POA: Diagnosis not present

## 2015-07-13 DIAGNOSIS — I10 Essential (primary) hypertension: Secondary | ICD-10-CM | POA: Diagnosis not present

## 2015-07-13 DIAGNOSIS — M5430 Sciatica, unspecified side: Secondary | ICD-10-CM | POA: Diagnosis not present

## 2015-07-14 DIAGNOSIS — I10 Essential (primary) hypertension: Secondary | ICD-10-CM | POA: Diagnosis not present

## 2015-07-14 DIAGNOSIS — M5430 Sciatica, unspecified side: Secondary | ICD-10-CM | POA: Diagnosis not present

## 2015-07-14 DIAGNOSIS — K219 Gastro-esophageal reflux disease without esophagitis: Secondary | ICD-10-CM | POA: Diagnosis not present

## 2015-07-15 DIAGNOSIS — K219 Gastro-esophageal reflux disease without esophagitis: Secondary | ICD-10-CM | POA: Diagnosis not present

## 2015-07-15 DIAGNOSIS — M5430 Sciatica, unspecified side: Secondary | ICD-10-CM | POA: Diagnosis not present

## 2015-07-15 DIAGNOSIS — I10 Essential (primary) hypertension: Secondary | ICD-10-CM | POA: Diagnosis not present

## 2015-07-16 DIAGNOSIS — I10 Essential (primary) hypertension: Secondary | ICD-10-CM | POA: Diagnosis not present

## 2015-07-16 DIAGNOSIS — M5430 Sciatica, unspecified side: Secondary | ICD-10-CM | POA: Diagnosis not present

## 2015-07-16 DIAGNOSIS — K219 Gastro-esophageal reflux disease without esophagitis: Secondary | ICD-10-CM | POA: Diagnosis not present

## 2015-07-17 DIAGNOSIS — M5430 Sciatica, unspecified side: Secondary | ICD-10-CM | POA: Diagnosis not present

## 2015-07-17 DIAGNOSIS — K219 Gastro-esophageal reflux disease without esophagitis: Secondary | ICD-10-CM | POA: Diagnosis not present

## 2015-07-17 DIAGNOSIS — I10 Essential (primary) hypertension: Secondary | ICD-10-CM | POA: Diagnosis not present

## 2015-07-18 DIAGNOSIS — I10 Essential (primary) hypertension: Secondary | ICD-10-CM | POA: Diagnosis not present

## 2015-07-18 DIAGNOSIS — M5430 Sciatica, unspecified side: Secondary | ICD-10-CM | POA: Diagnosis not present

## 2015-07-18 DIAGNOSIS — K219 Gastro-esophageal reflux disease without esophagitis: Secondary | ICD-10-CM | POA: Diagnosis not present

## 2015-07-19 DIAGNOSIS — K219 Gastro-esophageal reflux disease without esophagitis: Secondary | ICD-10-CM | POA: Diagnosis not present

## 2015-07-19 DIAGNOSIS — M5430 Sciatica, unspecified side: Secondary | ICD-10-CM | POA: Diagnosis not present

## 2015-07-19 DIAGNOSIS — I10 Essential (primary) hypertension: Secondary | ICD-10-CM | POA: Diagnosis not present

## 2015-07-20 DIAGNOSIS — M5430 Sciatica, unspecified side: Secondary | ICD-10-CM | POA: Diagnosis not present

## 2015-07-20 DIAGNOSIS — I10 Essential (primary) hypertension: Secondary | ICD-10-CM | POA: Diagnosis not present

## 2015-07-20 DIAGNOSIS — K219 Gastro-esophageal reflux disease without esophagitis: Secondary | ICD-10-CM | POA: Diagnosis not present

## 2015-07-21 DIAGNOSIS — M5430 Sciatica, unspecified side: Secondary | ICD-10-CM | POA: Diagnosis not present

## 2015-07-21 DIAGNOSIS — I10 Essential (primary) hypertension: Secondary | ICD-10-CM | POA: Diagnosis not present

## 2015-07-21 DIAGNOSIS — K219 Gastro-esophageal reflux disease without esophagitis: Secondary | ICD-10-CM | POA: Diagnosis not present

## 2015-07-22 DIAGNOSIS — M5430 Sciatica, unspecified side: Secondary | ICD-10-CM | POA: Diagnosis not present

## 2015-07-22 DIAGNOSIS — K219 Gastro-esophageal reflux disease without esophagitis: Secondary | ICD-10-CM | POA: Diagnosis not present

## 2015-07-22 DIAGNOSIS — I10 Essential (primary) hypertension: Secondary | ICD-10-CM | POA: Diagnosis not present

## 2015-07-23 DIAGNOSIS — M5430 Sciatica, unspecified side: Secondary | ICD-10-CM | POA: Diagnosis not present

## 2015-07-23 DIAGNOSIS — K219 Gastro-esophageal reflux disease without esophagitis: Secondary | ICD-10-CM | POA: Diagnosis not present

## 2015-07-23 DIAGNOSIS — I10 Essential (primary) hypertension: Secondary | ICD-10-CM | POA: Diagnosis not present

## 2015-07-24 ENCOUNTER — Ambulatory Visit (HOSPITAL_COMMUNITY): Payer: Self-pay | Admitting: Psychiatry

## 2015-07-24 DIAGNOSIS — I10 Essential (primary) hypertension: Secondary | ICD-10-CM | POA: Diagnosis not present

## 2015-07-24 DIAGNOSIS — M5430 Sciatica, unspecified side: Secondary | ICD-10-CM | POA: Diagnosis not present

## 2015-07-24 DIAGNOSIS — K219 Gastro-esophageal reflux disease without esophagitis: Secondary | ICD-10-CM | POA: Diagnosis not present

## 2015-07-25 DIAGNOSIS — I10 Essential (primary) hypertension: Secondary | ICD-10-CM | POA: Diagnosis not present

## 2015-07-25 DIAGNOSIS — M5430 Sciatica, unspecified side: Secondary | ICD-10-CM | POA: Diagnosis not present

## 2015-07-25 DIAGNOSIS — K219 Gastro-esophageal reflux disease without esophagitis: Secondary | ICD-10-CM | POA: Diagnosis not present

## 2015-07-26 DIAGNOSIS — M5430 Sciatica, unspecified side: Secondary | ICD-10-CM | POA: Diagnosis not present

## 2015-07-26 DIAGNOSIS — I10 Essential (primary) hypertension: Secondary | ICD-10-CM | POA: Diagnosis not present

## 2015-07-26 DIAGNOSIS — K219 Gastro-esophageal reflux disease without esophagitis: Secondary | ICD-10-CM | POA: Diagnosis not present

## 2015-07-27 DIAGNOSIS — I10 Essential (primary) hypertension: Secondary | ICD-10-CM | POA: Diagnosis not present

## 2015-07-27 DIAGNOSIS — M5430 Sciatica, unspecified side: Secondary | ICD-10-CM | POA: Diagnosis not present

## 2015-07-27 DIAGNOSIS — K219 Gastro-esophageal reflux disease without esophagitis: Secondary | ICD-10-CM | POA: Diagnosis not present

## 2015-07-28 DIAGNOSIS — I10 Essential (primary) hypertension: Secondary | ICD-10-CM | POA: Diagnosis not present

## 2015-07-28 DIAGNOSIS — K219 Gastro-esophageal reflux disease without esophagitis: Secondary | ICD-10-CM | POA: Diagnosis not present

## 2015-07-28 DIAGNOSIS — M5430 Sciatica, unspecified side: Secondary | ICD-10-CM | POA: Diagnosis not present

## 2015-07-29 DIAGNOSIS — M5430 Sciatica, unspecified side: Secondary | ICD-10-CM | POA: Diagnosis not present

## 2015-07-29 DIAGNOSIS — I10 Essential (primary) hypertension: Secondary | ICD-10-CM | POA: Diagnosis not present

## 2015-07-29 DIAGNOSIS — K219 Gastro-esophageal reflux disease without esophagitis: Secondary | ICD-10-CM | POA: Diagnosis not present

## 2015-07-30 DIAGNOSIS — M5430 Sciatica, unspecified side: Secondary | ICD-10-CM | POA: Diagnosis not present

## 2015-07-30 DIAGNOSIS — K219 Gastro-esophageal reflux disease without esophagitis: Secondary | ICD-10-CM | POA: Diagnosis not present

## 2015-07-30 DIAGNOSIS — I10 Essential (primary) hypertension: Secondary | ICD-10-CM | POA: Diagnosis not present

## 2015-07-31 DIAGNOSIS — I10 Essential (primary) hypertension: Secondary | ICD-10-CM | POA: Diagnosis not present

## 2015-07-31 DIAGNOSIS — M5430 Sciatica, unspecified side: Secondary | ICD-10-CM | POA: Diagnosis not present

## 2015-07-31 DIAGNOSIS — K219 Gastro-esophageal reflux disease without esophagitis: Secondary | ICD-10-CM | POA: Diagnosis not present

## 2015-08-01 DIAGNOSIS — I10 Essential (primary) hypertension: Secondary | ICD-10-CM | POA: Diagnosis not present

## 2015-08-01 DIAGNOSIS — M5430 Sciatica, unspecified side: Secondary | ICD-10-CM | POA: Diagnosis not present

## 2015-08-01 DIAGNOSIS — K219 Gastro-esophageal reflux disease without esophagitis: Secondary | ICD-10-CM | POA: Diagnosis not present

## 2015-08-02 DIAGNOSIS — I10 Essential (primary) hypertension: Secondary | ICD-10-CM | POA: Diagnosis not present

## 2015-08-02 DIAGNOSIS — M5430 Sciatica, unspecified side: Secondary | ICD-10-CM | POA: Diagnosis not present

## 2015-08-02 DIAGNOSIS — K219 Gastro-esophageal reflux disease without esophagitis: Secondary | ICD-10-CM | POA: Diagnosis not present

## 2015-08-03 DIAGNOSIS — K219 Gastro-esophageal reflux disease without esophagitis: Secondary | ICD-10-CM | POA: Diagnosis not present

## 2015-08-03 DIAGNOSIS — M5430 Sciatica, unspecified side: Secondary | ICD-10-CM | POA: Diagnosis not present

## 2015-08-03 DIAGNOSIS — I10 Essential (primary) hypertension: Secondary | ICD-10-CM | POA: Diagnosis not present

## 2015-08-04 DIAGNOSIS — K219 Gastro-esophageal reflux disease without esophagitis: Secondary | ICD-10-CM | POA: Diagnosis not present

## 2015-08-04 DIAGNOSIS — I10 Essential (primary) hypertension: Secondary | ICD-10-CM | POA: Diagnosis not present

## 2015-08-04 DIAGNOSIS — M5430 Sciatica, unspecified side: Secondary | ICD-10-CM | POA: Diagnosis not present

## 2015-08-05 DIAGNOSIS — K219 Gastro-esophageal reflux disease without esophagitis: Secondary | ICD-10-CM | POA: Diagnosis not present

## 2015-08-05 DIAGNOSIS — M5430 Sciatica, unspecified side: Secondary | ICD-10-CM | POA: Diagnosis not present

## 2015-08-05 DIAGNOSIS — I10 Essential (primary) hypertension: Secondary | ICD-10-CM | POA: Diagnosis not present

## 2015-08-06 DIAGNOSIS — K219 Gastro-esophageal reflux disease without esophagitis: Secondary | ICD-10-CM | POA: Diagnosis not present

## 2015-08-06 DIAGNOSIS — M5430 Sciatica, unspecified side: Secondary | ICD-10-CM | POA: Diagnosis not present

## 2015-08-06 DIAGNOSIS — I10 Essential (primary) hypertension: Secondary | ICD-10-CM | POA: Diagnosis not present

## 2015-08-07 DIAGNOSIS — I10 Essential (primary) hypertension: Secondary | ICD-10-CM | POA: Diagnosis not present

## 2015-08-07 DIAGNOSIS — K219 Gastro-esophageal reflux disease without esophagitis: Secondary | ICD-10-CM | POA: Diagnosis not present

## 2015-08-07 DIAGNOSIS — M5430 Sciatica, unspecified side: Secondary | ICD-10-CM | POA: Diagnosis not present

## 2015-08-08 DIAGNOSIS — I10 Essential (primary) hypertension: Secondary | ICD-10-CM | POA: Diagnosis not present

## 2015-08-08 DIAGNOSIS — K219 Gastro-esophageal reflux disease without esophagitis: Secondary | ICD-10-CM | POA: Diagnosis not present

## 2015-08-08 DIAGNOSIS — M5430 Sciatica, unspecified side: Secondary | ICD-10-CM | POA: Diagnosis not present

## 2015-08-09 DIAGNOSIS — M5430 Sciatica, unspecified side: Secondary | ICD-10-CM | POA: Diagnosis not present

## 2015-08-09 DIAGNOSIS — I10 Essential (primary) hypertension: Secondary | ICD-10-CM | POA: Diagnosis not present

## 2015-08-09 DIAGNOSIS — K219 Gastro-esophageal reflux disease without esophagitis: Secondary | ICD-10-CM | POA: Diagnosis not present

## 2015-08-10 DIAGNOSIS — M5430 Sciatica, unspecified side: Secondary | ICD-10-CM | POA: Diagnosis not present

## 2015-08-10 DIAGNOSIS — I10 Essential (primary) hypertension: Secondary | ICD-10-CM | POA: Diagnosis not present

## 2015-08-10 DIAGNOSIS — K219 Gastro-esophageal reflux disease without esophagitis: Secondary | ICD-10-CM | POA: Diagnosis not present

## 2015-08-11 DIAGNOSIS — K219 Gastro-esophageal reflux disease without esophagitis: Secondary | ICD-10-CM | POA: Diagnosis not present

## 2015-08-11 DIAGNOSIS — M5430 Sciatica, unspecified side: Secondary | ICD-10-CM | POA: Diagnosis not present

## 2015-08-11 DIAGNOSIS — I10 Essential (primary) hypertension: Secondary | ICD-10-CM | POA: Diagnosis not present

## 2015-08-12 DIAGNOSIS — M5430 Sciatica, unspecified side: Secondary | ICD-10-CM | POA: Diagnosis not present

## 2015-08-12 DIAGNOSIS — K219 Gastro-esophageal reflux disease without esophagitis: Secondary | ICD-10-CM | POA: Diagnosis not present

## 2015-08-12 DIAGNOSIS — I10 Essential (primary) hypertension: Secondary | ICD-10-CM | POA: Diagnosis not present

## 2015-08-13 DIAGNOSIS — I10 Essential (primary) hypertension: Secondary | ICD-10-CM | POA: Diagnosis not present

## 2015-08-13 DIAGNOSIS — M5430 Sciatica, unspecified side: Secondary | ICD-10-CM | POA: Diagnosis not present

## 2015-08-13 DIAGNOSIS — K219 Gastro-esophageal reflux disease without esophagitis: Secondary | ICD-10-CM | POA: Diagnosis not present

## 2015-08-14 DIAGNOSIS — I10 Essential (primary) hypertension: Secondary | ICD-10-CM | POA: Diagnosis not present

## 2015-08-14 DIAGNOSIS — M5430 Sciatica, unspecified side: Secondary | ICD-10-CM | POA: Diagnosis not present

## 2015-08-14 DIAGNOSIS — K219 Gastro-esophageal reflux disease without esophagitis: Secondary | ICD-10-CM | POA: Diagnosis not present

## 2015-08-15 DIAGNOSIS — K219 Gastro-esophageal reflux disease without esophagitis: Secondary | ICD-10-CM | POA: Diagnosis not present

## 2015-08-15 DIAGNOSIS — I10 Essential (primary) hypertension: Secondary | ICD-10-CM | POA: Diagnosis not present

## 2015-08-15 DIAGNOSIS — M5430 Sciatica, unspecified side: Secondary | ICD-10-CM | POA: Diagnosis not present

## 2015-08-17 DIAGNOSIS — I10 Essential (primary) hypertension: Secondary | ICD-10-CM | POA: Diagnosis not present

## 2015-08-17 DIAGNOSIS — M5430 Sciatica, unspecified side: Secondary | ICD-10-CM | POA: Diagnosis not present

## 2015-08-17 DIAGNOSIS — K219 Gastro-esophageal reflux disease without esophagitis: Secondary | ICD-10-CM | POA: Diagnosis not present

## 2015-08-18 DIAGNOSIS — K219 Gastro-esophageal reflux disease without esophagitis: Secondary | ICD-10-CM | POA: Diagnosis not present

## 2015-08-18 DIAGNOSIS — I10 Essential (primary) hypertension: Secondary | ICD-10-CM | POA: Diagnosis not present

## 2015-08-18 DIAGNOSIS — M5430 Sciatica, unspecified side: Secondary | ICD-10-CM | POA: Diagnosis not present

## 2015-08-19 DIAGNOSIS — I10 Essential (primary) hypertension: Secondary | ICD-10-CM | POA: Diagnosis not present

## 2015-08-19 DIAGNOSIS — K219 Gastro-esophageal reflux disease without esophagitis: Secondary | ICD-10-CM | POA: Diagnosis not present

## 2015-08-19 DIAGNOSIS — M5430 Sciatica, unspecified side: Secondary | ICD-10-CM | POA: Diagnosis not present

## 2015-08-20 DIAGNOSIS — K219 Gastro-esophageal reflux disease without esophagitis: Secondary | ICD-10-CM | POA: Diagnosis not present

## 2015-08-20 DIAGNOSIS — I10 Essential (primary) hypertension: Secondary | ICD-10-CM | POA: Diagnosis not present

## 2015-08-20 DIAGNOSIS — M5430 Sciatica, unspecified side: Secondary | ICD-10-CM | POA: Diagnosis not present

## 2015-08-21 DIAGNOSIS — I10 Essential (primary) hypertension: Secondary | ICD-10-CM | POA: Diagnosis not present

## 2015-08-21 DIAGNOSIS — M5430 Sciatica, unspecified side: Secondary | ICD-10-CM | POA: Diagnosis not present

## 2015-08-21 DIAGNOSIS — K219 Gastro-esophageal reflux disease without esophagitis: Secondary | ICD-10-CM | POA: Diagnosis not present

## 2015-08-22 DIAGNOSIS — I10 Essential (primary) hypertension: Secondary | ICD-10-CM | POA: Diagnosis not present

## 2015-08-22 DIAGNOSIS — K219 Gastro-esophageal reflux disease without esophagitis: Secondary | ICD-10-CM | POA: Diagnosis not present

## 2015-08-22 DIAGNOSIS — M5430 Sciatica, unspecified side: Secondary | ICD-10-CM | POA: Diagnosis not present

## 2015-08-23 DIAGNOSIS — I10 Essential (primary) hypertension: Secondary | ICD-10-CM | POA: Diagnosis not present

## 2015-08-23 DIAGNOSIS — M5430 Sciatica, unspecified side: Secondary | ICD-10-CM | POA: Diagnosis not present

## 2015-08-23 DIAGNOSIS — K219 Gastro-esophageal reflux disease without esophagitis: Secondary | ICD-10-CM | POA: Diagnosis not present

## 2015-08-24 DIAGNOSIS — M5430 Sciatica, unspecified side: Secondary | ICD-10-CM | POA: Diagnosis not present

## 2015-08-24 DIAGNOSIS — K219 Gastro-esophageal reflux disease without esophagitis: Secondary | ICD-10-CM | POA: Diagnosis not present

## 2015-08-24 DIAGNOSIS — I10 Essential (primary) hypertension: Secondary | ICD-10-CM | POA: Diagnosis not present

## 2015-08-25 DIAGNOSIS — K219 Gastro-esophageal reflux disease without esophagitis: Secondary | ICD-10-CM | POA: Diagnosis not present

## 2015-08-25 DIAGNOSIS — I10 Essential (primary) hypertension: Secondary | ICD-10-CM | POA: Diagnosis not present

## 2015-08-25 DIAGNOSIS — M5430 Sciatica, unspecified side: Secondary | ICD-10-CM | POA: Diagnosis not present

## 2015-08-26 DIAGNOSIS — I10 Essential (primary) hypertension: Secondary | ICD-10-CM | POA: Diagnosis not present

## 2015-08-26 DIAGNOSIS — M5430 Sciatica, unspecified side: Secondary | ICD-10-CM | POA: Diagnosis not present

## 2015-08-26 DIAGNOSIS — K219 Gastro-esophageal reflux disease without esophagitis: Secondary | ICD-10-CM | POA: Diagnosis not present

## 2015-08-27 DIAGNOSIS — M5430 Sciatica, unspecified side: Secondary | ICD-10-CM | POA: Diagnosis not present

## 2015-08-27 DIAGNOSIS — K219 Gastro-esophageal reflux disease without esophagitis: Secondary | ICD-10-CM | POA: Diagnosis not present

## 2015-08-27 DIAGNOSIS — I10 Essential (primary) hypertension: Secondary | ICD-10-CM | POA: Diagnosis not present

## 2015-08-28 DIAGNOSIS — I10 Essential (primary) hypertension: Secondary | ICD-10-CM | POA: Diagnosis not present

## 2015-08-28 DIAGNOSIS — K219 Gastro-esophageal reflux disease without esophagitis: Secondary | ICD-10-CM | POA: Diagnosis not present

## 2015-08-28 DIAGNOSIS — M5430 Sciatica, unspecified side: Secondary | ICD-10-CM | POA: Diagnosis not present

## 2015-08-29 DIAGNOSIS — K219 Gastro-esophageal reflux disease without esophagitis: Secondary | ICD-10-CM | POA: Diagnosis not present

## 2015-08-29 DIAGNOSIS — M5430 Sciatica, unspecified side: Secondary | ICD-10-CM | POA: Diagnosis not present

## 2015-08-29 DIAGNOSIS — I10 Essential (primary) hypertension: Secondary | ICD-10-CM | POA: Diagnosis not present

## 2015-08-30 DIAGNOSIS — M5430 Sciatica, unspecified side: Secondary | ICD-10-CM | POA: Diagnosis not present

## 2015-08-30 DIAGNOSIS — I10 Essential (primary) hypertension: Secondary | ICD-10-CM | POA: Diagnosis not present

## 2015-08-30 DIAGNOSIS — K219 Gastro-esophageal reflux disease without esophagitis: Secondary | ICD-10-CM | POA: Diagnosis not present

## 2015-08-31 DIAGNOSIS — I10 Essential (primary) hypertension: Secondary | ICD-10-CM | POA: Diagnosis not present

## 2015-08-31 DIAGNOSIS — K219 Gastro-esophageal reflux disease without esophagitis: Secondary | ICD-10-CM | POA: Diagnosis not present

## 2015-08-31 DIAGNOSIS — M5430 Sciatica, unspecified side: Secondary | ICD-10-CM | POA: Diagnosis not present

## 2015-09-01 DIAGNOSIS — K219 Gastro-esophageal reflux disease without esophagitis: Secondary | ICD-10-CM | POA: Diagnosis not present

## 2015-09-01 DIAGNOSIS — I10 Essential (primary) hypertension: Secondary | ICD-10-CM | POA: Diagnosis not present

## 2015-09-01 DIAGNOSIS — M5430 Sciatica, unspecified side: Secondary | ICD-10-CM | POA: Diagnosis not present

## 2015-09-02 DIAGNOSIS — M5430 Sciatica, unspecified side: Secondary | ICD-10-CM | POA: Diagnosis not present

## 2015-09-02 DIAGNOSIS — I10 Essential (primary) hypertension: Secondary | ICD-10-CM | POA: Diagnosis not present

## 2015-09-02 DIAGNOSIS — K219 Gastro-esophageal reflux disease without esophagitis: Secondary | ICD-10-CM | POA: Diagnosis not present

## 2015-09-03 DIAGNOSIS — K219 Gastro-esophageal reflux disease without esophagitis: Secondary | ICD-10-CM | POA: Diagnosis not present

## 2015-09-03 DIAGNOSIS — M5430 Sciatica, unspecified side: Secondary | ICD-10-CM | POA: Diagnosis not present

## 2015-09-03 DIAGNOSIS — I10 Essential (primary) hypertension: Secondary | ICD-10-CM | POA: Diagnosis not present

## 2015-09-04 DIAGNOSIS — K219 Gastro-esophageal reflux disease without esophagitis: Secondary | ICD-10-CM | POA: Diagnosis not present

## 2015-09-04 DIAGNOSIS — M5430 Sciatica, unspecified side: Secondary | ICD-10-CM | POA: Diagnosis not present

## 2015-09-04 DIAGNOSIS — I10 Essential (primary) hypertension: Secondary | ICD-10-CM | POA: Diagnosis not present

## 2015-09-05 DIAGNOSIS — I10 Essential (primary) hypertension: Secondary | ICD-10-CM | POA: Diagnosis not present

## 2015-09-05 DIAGNOSIS — M5430 Sciatica, unspecified side: Secondary | ICD-10-CM | POA: Diagnosis not present

## 2015-09-05 DIAGNOSIS — K219 Gastro-esophageal reflux disease without esophagitis: Secondary | ICD-10-CM | POA: Diagnosis not present

## 2015-09-06 DIAGNOSIS — K219 Gastro-esophageal reflux disease without esophagitis: Secondary | ICD-10-CM | POA: Diagnosis not present

## 2015-09-06 DIAGNOSIS — I10 Essential (primary) hypertension: Secondary | ICD-10-CM | POA: Diagnosis not present

## 2015-09-06 DIAGNOSIS — M5430 Sciatica, unspecified side: Secondary | ICD-10-CM | POA: Diagnosis not present

## 2015-09-07 DIAGNOSIS — I10 Essential (primary) hypertension: Secondary | ICD-10-CM | POA: Diagnosis not present

## 2015-09-07 DIAGNOSIS — M5430 Sciatica, unspecified side: Secondary | ICD-10-CM | POA: Diagnosis not present

## 2015-09-07 DIAGNOSIS — K219 Gastro-esophageal reflux disease without esophagitis: Secondary | ICD-10-CM | POA: Diagnosis not present

## 2015-09-08 DIAGNOSIS — I10 Essential (primary) hypertension: Secondary | ICD-10-CM | POA: Diagnosis not present

## 2015-09-08 DIAGNOSIS — K219 Gastro-esophageal reflux disease without esophagitis: Secondary | ICD-10-CM | POA: Diagnosis not present

## 2015-09-08 DIAGNOSIS — M5430 Sciatica, unspecified side: Secondary | ICD-10-CM | POA: Diagnosis not present

## 2015-09-09 DIAGNOSIS — M5430 Sciatica, unspecified side: Secondary | ICD-10-CM | POA: Diagnosis not present

## 2015-09-09 DIAGNOSIS — I10 Essential (primary) hypertension: Secondary | ICD-10-CM | POA: Diagnosis not present

## 2015-09-09 DIAGNOSIS — K219 Gastro-esophageal reflux disease without esophagitis: Secondary | ICD-10-CM | POA: Diagnosis not present

## 2015-09-10 DIAGNOSIS — M5430 Sciatica, unspecified side: Secondary | ICD-10-CM | POA: Diagnosis not present

## 2015-09-10 DIAGNOSIS — K219 Gastro-esophageal reflux disease without esophagitis: Secondary | ICD-10-CM | POA: Diagnosis not present

## 2015-09-10 DIAGNOSIS — I10 Essential (primary) hypertension: Secondary | ICD-10-CM | POA: Diagnosis not present

## 2015-09-11 DIAGNOSIS — M5430 Sciatica, unspecified side: Secondary | ICD-10-CM | POA: Diagnosis not present

## 2015-09-11 DIAGNOSIS — I10 Essential (primary) hypertension: Secondary | ICD-10-CM | POA: Diagnosis not present

## 2015-09-11 DIAGNOSIS — K219 Gastro-esophageal reflux disease without esophagitis: Secondary | ICD-10-CM | POA: Diagnosis not present

## 2015-09-12 DIAGNOSIS — I10 Essential (primary) hypertension: Secondary | ICD-10-CM | POA: Diagnosis not present

## 2015-09-12 DIAGNOSIS — M5430 Sciatica, unspecified side: Secondary | ICD-10-CM | POA: Diagnosis not present

## 2015-09-12 DIAGNOSIS — K219 Gastro-esophageal reflux disease without esophagitis: Secondary | ICD-10-CM | POA: Diagnosis not present

## 2015-09-13 DIAGNOSIS — M5430 Sciatica, unspecified side: Secondary | ICD-10-CM | POA: Diagnosis not present

## 2015-09-13 DIAGNOSIS — K219 Gastro-esophageal reflux disease without esophagitis: Secondary | ICD-10-CM | POA: Diagnosis not present

## 2015-09-13 DIAGNOSIS — I10 Essential (primary) hypertension: Secondary | ICD-10-CM | POA: Diagnosis not present

## 2015-09-14 DIAGNOSIS — M5430 Sciatica, unspecified side: Secondary | ICD-10-CM | POA: Diagnosis not present

## 2015-09-14 DIAGNOSIS — I10 Essential (primary) hypertension: Secondary | ICD-10-CM | POA: Diagnosis not present

## 2015-09-14 DIAGNOSIS — K219 Gastro-esophageal reflux disease without esophagitis: Secondary | ICD-10-CM | POA: Diagnosis not present

## 2015-09-15 DIAGNOSIS — I10 Essential (primary) hypertension: Secondary | ICD-10-CM | POA: Diagnosis not present

## 2015-09-15 DIAGNOSIS — M5430 Sciatica, unspecified side: Secondary | ICD-10-CM | POA: Diagnosis not present

## 2015-09-15 DIAGNOSIS — K219 Gastro-esophageal reflux disease without esophagitis: Secondary | ICD-10-CM | POA: Diagnosis not present

## 2015-09-17 DIAGNOSIS — I10 Essential (primary) hypertension: Secondary | ICD-10-CM | POA: Diagnosis not present

## 2015-09-17 DIAGNOSIS — M5430 Sciatica, unspecified side: Secondary | ICD-10-CM | POA: Diagnosis not present

## 2015-09-17 DIAGNOSIS — K219 Gastro-esophageal reflux disease without esophagitis: Secondary | ICD-10-CM | POA: Diagnosis not present

## 2015-09-18 DIAGNOSIS — I10 Essential (primary) hypertension: Secondary | ICD-10-CM | POA: Diagnosis not present

## 2015-09-18 DIAGNOSIS — K219 Gastro-esophageal reflux disease without esophagitis: Secondary | ICD-10-CM | POA: Diagnosis not present

## 2015-09-18 DIAGNOSIS — M5430 Sciatica, unspecified side: Secondary | ICD-10-CM | POA: Diagnosis not present

## 2015-09-19 DIAGNOSIS — I10 Essential (primary) hypertension: Secondary | ICD-10-CM | POA: Diagnosis not present

## 2015-09-19 DIAGNOSIS — M5430 Sciatica, unspecified side: Secondary | ICD-10-CM | POA: Diagnosis not present

## 2015-09-19 DIAGNOSIS — K219 Gastro-esophageal reflux disease without esophagitis: Secondary | ICD-10-CM | POA: Diagnosis not present

## 2015-09-20 DIAGNOSIS — K219 Gastro-esophageal reflux disease without esophagitis: Secondary | ICD-10-CM | POA: Diagnosis not present

## 2015-09-20 DIAGNOSIS — M5430 Sciatica, unspecified side: Secondary | ICD-10-CM | POA: Diagnosis not present

## 2015-09-20 DIAGNOSIS — I10 Essential (primary) hypertension: Secondary | ICD-10-CM | POA: Diagnosis not present

## 2015-09-21 DIAGNOSIS — I10 Essential (primary) hypertension: Secondary | ICD-10-CM | POA: Diagnosis not present

## 2015-09-21 DIAGNOSIS — M5430 Sciatica, unspecified side: Secondary | ICD-10-CM | POA: Diagnosis not present

## 2015-09-21 DIAGNOSIS — K219 Gastro-esophageal reflux disease without esophagitis: Secondary | ICD-10-CM | POA: Diagnosis not present

## 2015-09-22 DIAGNOSIS — K219 Gastro-esophageal reflux disease without esophagitis: Secondary | ICD-10-CM | POA: Diagnosis not present

## 2015-09-22 DIAGNOSIS — M5430 Sciatica, unspecified side: Secondary | ICD-10-CM | POA: Diagnosis not present

## 2015-09-22 DIAGNOSIS — I10 Essential (primary) hypertension: Secondary | ICD-10-CM | POA: Diagnosis not present

## 2015-09-24 DIAGNOSIS — M5430 Sciatica, unspecified side: Secondary | ICD-10-CM | POA: Diagnosis not present

## 2015-09-24 DIAGNOSIS — I10 Essential (primary) hypertension: Secondary | ICD-10-CM | POA: Diagnosis not present

## 2015-09-24 DIAGNOSIS — K219 Gastro-esophageal reflux disease without esophagitis: Secondary | ICD-10-CM | POA: Diagnosis not present

## 2015-09-25 DIAGNOSIS — I10 Essential (primary) hypertension: Secondary | ICD-10-CM | POA: Diagnosis not present

## 2015-09-25 DIAGNOSIS — K219 Gastro-esophageal reflux disease without esophagitis: Secondary | ICD-10-CM | POA: Diagnosis not present

## 2015-09-25 DIAGNOSIS — M5430 Sciatica, unspecified side: Secondary | ICD-10-CM | POA: Diagnosis not present

## 2015-09-26 DIAGNOSIS — I10 Essential (primary) hypertension: Secondary | ICD-10-CM | POA: Diagnosis not present

## 2015-09-26 DIAGNOSIS — M5430 Sciatica, unspecified side: Secondary | ICD-10-CM | POA: Diagnosis not present

## 2015-09-26 DIAGNOSIS — K219 Gastro-esophageal reflux disease without esophagitis: Secondary | ICD-10-CM | POA: Diagnosis not present

## 2015-09-27 DIAGNOSIS — K219 Gastro-esophageal reflux disease without esophagitis: Secondary | ICD-10-CM | POA: Diagnosis not present

## 2015-09-27 DIAGNOSIS — M5430 Sciatica, unspecified side: Secondary | ICD-10-CM | POA: Diagnosis not present

## 2015-09-27 DIAGNOSIS — I10 Essential (primary) hypertension: Secondary | ICD-10-CM | POA: Diagnosis not present

## 2015-09-28 DIAGNOSIS — K219 Gastro-esophageal reflux disease without esophagitis: Secondary | ICD-10-CM | POA: Diagnosis not present

## 2015-09-28 DIAGNOSIS — M5430 Sciatica, unspecified side: Secondary | ICD-10-CM | POA: Diagnosis not present

## 2015-09-28 DIAGNOSIS — I10 Essential (primary) hypertension: Secondary | ICD-10-CM | POA: Diagnosis not present

## 2015-09-29 DIAGNOSIS — M5430 Sciatica, unspecified side: Secondary | ICD-10-CM | POA: Diagnosis not present

## 2015-09-29 DIAGNOSIS — K219 Gastro-esophageal reflux disease without esophagitis: Secondary | ICD-10-CM | POA: Diagnosis not present

## 2015-09-29 DIAGNOSIS — I10 Essential (primary) hypertension: Secondary | ICD-10-CM | POA: Diagnosis not present

## 2015-09-30 DIAGNOSIS — M5430 Sciatica, unspecified side: Secondary | ICD-10-CM | POA: Diagnosis not present

## 2015-09-30 DIAGNOSIS — I10 Essential (primary) hypertension: Secondary | ICD-10-CM | POA: Diagnosis not present

## 2015-09-30 DIAGNOSIS — K219 Gastro-esophageal reflux disease without esophagitis: Secondary | ICD-10-CM | POA: Diagnosis not present

## 2015-10-01 DIAGNOSIS — I10 Essential (primary) hypertension: Secondary | ICD-10-CM | POA: Diagnosis not present

## 2015-10-01 DIAGNOSIS — K219 Gastro-esophageal reflux disease without esophagitis: Secondary | ICD-10-CM | POA: Diagnosis not present

## 2015-10-01 DIAGNOSIS — M5430 Sciatica, unspecified side: Secondary | ICD-10-CM | POA: Diagnosis not present

## 2015-10-02 DIAGNOSIS — K219 Gastro-esophageal reflux disease without esophagitis: Secondary | ICD-10-CM | POA: Diagnosis not present

## 2015-10-02 DIAGNOSIS — I10 Essential (primary) hypertension: Secondary | ICD-10-CM | POA: Diagnosis not present

## 2015-10-02 DIAGNOSIS — M5430 Sciatica, unspecified side: Secondary | ICD-10-CM | POA: Diagnosis not present

## 2015-10-03 DIAGNOSIS — I10 Essential (primary) hypertension: Secondary | ICD-10-CM | POA: Diagnosis not present

## 2015-10-03 DIAGNOSIS — M5430 Sciatica, unspecified side: Secondary | ICD-10-CM | POA: Diagnosis not present

## 2015-10-03 DIAGNOSIS — K219 Gastro-esophageal reflux disease without esophagitis: Secondary | ICD-10-CM | POA: Diagnosis not present

## 2015-10-04 DIAGNOSIS — K219 Gastro-esophageal reflux disease without esophagitis: Secondary | ICD-10-CM | POA: Diagnosis not present

## 2015-10-04 DIAGNOSIS — I10 Essential (primary) hypertension: Secondary | ICD-10-CM | POA: Diagnosis not present

## 2015-10-04 DIAGNOSIS — M5430 Sciatica, unspecified side: Secondary | ICD-10-CM | POA: Diagnosis not present

## 2015-10-05 DIAGNOSIS — M5430 Sciatica, unspecified side: Secondary | ICD-10-CM | POA: Diagnosis not present

## 2015-10-05 DIAGNOSIS — K219 Gastro-esophageal reflux disease without esophagitis: Secondary | ICD-10-CM | POA: Diagnosis not present

## 2015-10-05 DIAGNOSIS — I10 Essential (primary) hypertension: Secondary | ICD-10-CM | POA: Diagnosis not present

## 2015-10-06 DIAGNOSIS — I10 Essential (primary) hypertension: Secondary | ICD-10-CM | POA: Diagnosis not present

## 2015-10-06 DIAGNOSIS — K219 Gastro-esophageal reflux disease without esophagitis: Secondary | ICD-10-CM | POA: Diagnosis not present

## 2015-10-06 DIAGNOSIS — M5430 Sciatica, unspecified side: Secondary | ICD-10-CM | POA: Diagnosis not present

## 2015-10-07 DIAGNOSIS — I10 Essential (primary) hypertension: Secondary | ICD-10-CM | POA: Diagnosis not present

## 2015-10-07 DIAGNOSIS — K219 Gastro-esophageal reflux disease without esophagitis: Secondary | ICD-10-CM | POA: Diagnosis not present

## 2015-10-07 DIAGNOSIS — M5430 Sciatica, unspecified side: Secondary | ICD-10-CM | POA: Diagnosis not present

## 2015-10-09 ENCOUNTER — Encounter (HOSPITAL_COMMUNITY): Payer: Self-pay | Admitting: Emergency Medicine

## 2015-10-09 ENCOUNTER — Emergency Department (HOSPITAL_COMMUNITY)
Admission: EM | Admit: 2015-10-09 | Discharge: 2015-10-10 | Disposition: A | Discharge status: Home / Self Care | Payer: Commercial Managed Care - HMO | Attending: Emergency Medicine | Admitting: Emergency Medicine

## 2015-10-09 DIAGNOSIS — M5430 Sciatica, unspecified side: Secondary | ICD-10-CM | POA: Diagnosis not present

## 2015-10-09 DIAGNOSIS — G8929 Other chronic pain: Secondary | ICD-10-CM | POA: Diagnosis not present

## 2015-10-09 DIAGNOSIS — K921 Melena: Secondary | ICD-10-CM | POA: Diagnosis not present

## 2015-10-09 DIAGNOSIS — K219 Gastro-esophageal reflux disease without esophagitis: Secondary | ICD-10-CM | POA: Diagnosis not present

## 2015-10-09 DIAGNOSIS — M545 Low back pain: Secondary | ICD-10-CM | POA: Insufficient documentation

## 2015-10-09 DIAGNOSIS — Z8659 Personal history of other mental and behavioral disorders: Secondary | ICD-10-CM | POA: Diagnosis not present

## 2015-10-09 DIAGNOSIS — F17210 Nicotine dependence, cigarettes, uncomplicated: Secondary | ICD-10-CM | POA: Diagnosis not present

## 2015-10-09 DIAGNOSIS — Z760 Encounter for issue of repeat prescription: Secondary | ICD-10-CM

## 2015-10-09 DIAGNOSIS — M549 Dorsalgia, unspecified: Secondary | ICD-10-CM

## 2015-10-09 DIAGNOSIS — G894 Chronic pain syndrome: Secondary | ICD-10-CM | POA: Insufficient documentation

## 2015-10-09 DIAGNOSIS — I10 Essential (primary) hypertension: Secondary | ICD-10-CM | POA: Diagnosis not present

## 2015-10-09 DIAGNOSIS — K625 Hemorrhage of anus and rectum: Secondary | ICD-10-CM | POA: Diagnosis present

## 2015-10-09 LAB — CBC
HCT: 39.2 % (ref 39.0–52.0)
HEMOGLOBIN: 13.2 g/dL (ref 13.0–17.0)
MCH: 30.8 pg (ref 26.0–34.0)
MCHC: 33.7 g/dL (ref 30.0–36.0)
MCV: 91.4 fL (ref 78.0–100.0)
Platelets: 222 10*3/uL (ref 150–400)
RBC: 4.29 MIL/uL (ref 4.22–5.81)
RDW: 12.7 % (ref 11.5–15.5)
WBC: 6.3 10*3/uL (ref 4.0–10.5)

## 2015-10-09 LAB — COMPREHENSIVE METABOLIC PANEL
ALBUMIN: 4.3 g/dL (ref 3.5–5.0)
ALK PHOS: 42 U/L (ref 38–126)
ALT: 36 U/L (ref 17–63)
AST: 32 U/L (ref 15–41)
Anion gap: 9 (ref 5–15)
BUN: 19 mg/dL (ref 6–20)
CALCIUM: 9.1 mg/dL (ref 8.9–10.3)
CHLORIDE: 106 mmol/L (ref 101–111)
CO2: 24 mmol/L (ref 22–32)
Creatinine, Ser: 0.91 mg/dL (ref 0.61–1.24)
GFR calc non Af Amer: >60 mL/min (ref >60)
GLUCOSE: 109 mg/dL — AB (ref 65–99)
Potassium: 3.7 mmol/L (ref 3.5–5.1)
SODIUM: 139 mmol/L (ref 135–145)
Total Bilirubin: 0.8 mg/dL (ref 0.3–1.2)
Total Protein: 7.1 g/dL (ref 6.5–8.1)

## 2015-10-09 LAB — LIPASE, BLOOD: LIPASE: 23 U/L (ref 11–51)
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
## 2015-10-09 NOTE — ED Notes (Signed)
Patient presents with rectal bleeding x1 week, chronic lower back pain and lightheadedness.

## 2015-10-10 ENCOUNTER — Emergency Department (HOSPITAL_COMMUNITY): Payer: Commercial Managed Care - HMO

## 2015-10-10 DIAGNOSIS — K921 Melena: Secondary | ICD-10-CM | POA: Diagnosis not present

## 2015-10-10 DIAGNOSIS — M5430 Sciatica, unspecified side: Secondary | ICD-10-CM | POA: Diagnosis not present

## 2015-10-10 DIAGNOSIS — M545 Low back pain: Secondary | ICD-10-CM | POA: Diagnosis not present

## 2015-10-10 DIAGNOSIS — I10 Essential (primary) hypertension: Secondary | ICD-10-CM | POA: Diagnosis not present

## 2015-10-10 DIAGNOSIS — K219 Gastro-esophageal reflux disease without esophagitis: Secondary | ICD-10-CM | POA: Diagnosis not present

## 2015-10-10 MED ORDER — ALBUTEROL SULFATE HFA 108 (90 BASE) MCG/ACT IN AERS
1.0000 | INHALATION_SPRAY | Freq: Once | RESPIRATORY_TRACT | Status: AC
  Administered 2015-10-10: 1 via RESPIRATORY_TRACT
  Filled 2015-10-10: qty 6.7

## 2015-10-10 NOTE — Discharge Instructions (Signed)
Stop taking Goody Powders/BC Powders.  You can try heat therapy at home or tylenol over the counter for your back pain.  Do not take more than what is listed on the label.      Chronic Back Pain  When back pain lasts longer than 3 months, it is called chronic back pain.People with chronic back pain often go through certain periods that are more intense (flare-ups).  CAUSES Chronic back pain can be caused by wear and tear (degeneration) on different structures in your back. These structures include:  The bones of your spine (vertebrae) and the joints surrounding your spinal cord and nerve roots (facets).  The strong, fibrous tissues that connect your vertebrae (ligaments). Degeneration of these structures may result in pressure on your nerves. This can lead to constant pain. HOME CARE INSTRUCTIONS  Avoid bending, heavy lifting, prolonged sitting, and activities which make the problem worse.  Take brief periods of rest throughout the day to reduce your pain. Lying down or standing usually is better than sitting while you are resting.  Take over-the-counter or prescription medicines only as directed by your caregiver. SEEK IMMEDIATE MEDICAL CARE IF:   You have weakness or numbness in one of your legs or feet.  You have trouble controlling your bladder or bowels.  You have nausea, vomiting, abdominal pain, shortness of breath, or fainting.   This information is not intended to replace advice given to you by your health care provider. Make sure you discuss any questions you have with your health care provider.   Document Released: 10/16/2004 Document Revised: 12/01/2011 Document Reviewed: 02/26/2015 Elsevier Interactive Patient Education 2016 Else[employer] Inc[employer]   Gastrointestinal Bleeding Gastrointestinal (GI) bleeding means there is bleeding somewhere along the digestive tract, between the mouth and anus. CAUSES  There are many different problems that can cause GI bleeding. Possible  causes include:  Esophagitis. This is inflammation, irritation, or swelling of the esophagus.  Hemorrhoids.These are veins that are full of blood (engorged) in the rectum. They cause pain, inflammation, and may bleed.  Anal fissures.These are areas of painful tearing which may bleed. They are often caused by passing hard stool.  Diverticulosis.These are pouches that form on the colon over time, with age, and may bleed significantly.  Diverticulitis.This is inflammation in areas with diverticulosis. It can cause pain, fever, and bloody stools, although bleeding is rare.  Polyps and cancer. Colon cancer often starts out as precancerous polyps.  Gastritis and ulcers.Bleeding from the upper gastrointestinal tract (near the stomach) may travel through the intestines and produce black, sometimes tarry, often bad smelling stools. In certain cases, if the bleeding is fast enough, the stools may not be black, but red. This condition may be life-threatening. SYMPTOMS   Vomiting bright red blood or material that looks like coffee grounds.  Bloody, black, or tarry stools. DIAGNOSIS  Your caregiver may diagnose your condition by taking your history and performing a physical exam. More tests may be needed, including:  X-rays and other imaging tests.  Esophagogastroduodenoscopy (EGD). This test uses a flexible, lighted tube to look at your esophagus, stomach, and small intestine.  Colonoscopy. This test uses a flexible, lighted tube to look at your colon. TREATMENT  Treatment depends on the cause of your bleeding.   For bleeding from the esophagus, stomach, small intestine, or colon, the caregiver doing your EGD or colonoscopy may be able to stop the bleeding as part of the procedure.  Inflammation or infection of the colon can be treated with  medicines.  Many rectal problems can be treated with creams, suppositories, or warm baths.  Surgery is sometimes needed.  Blood transfusions are  sometimes needed if you have lost a lot of blood. If bleeding is slow, you may be allowed to go home. If there is a lot of bleeding, you will need to stay in the hospital for observation. HOME CARE INSTRUCTIONS   Take any medicines exactly as prescribed.  Keep your stools soft by eating foods that are high in fiber. These foods include whole grains, legumes, fruits, and vegetables. Prunes (1 to 3 a day) work well for many people.  Drink enough fluids to keep your urine clear or pale yellow. SEEK IMMEDIATE MEDICAL CARE IF:   Your bleeding increases.  You feel lightheaded, weak, or you faint.  You have severe cramps in your back or abdomen.  You pass large blood clots in your stool.  Your problems are getting worse. MAKE SURE YOU:   Understand these instructions.  Will watch your condition.  Will get help right away if you are not doing well or get worse.   This information is not intended to replace advice given to you by your health care provider. Make sure you discuss any questions you have with your health care provider.   Document Released: 09/05/2000 Document Revised: 08/25/2012 Document Reviewed: 02/26/2015 Elsevier Interactive Patient Education [employer]

## 2015-10-11 DIAGNOSIS — M5430 Sciatica, unspecified side: Secondary | ICD-10-CM | POA: Diagnosis not present

## 2015-10-11 DIAGNOSIS — K219 Gastro-esophageal reflux disease without esophagitis: Secondary | ICD-10-CM | POA: Diagnosis not present

## 2015-10-11 DIAGNOSIS — I10 Essential (primary) hypertension: Secondary | ICD-10-CM | POA: Diagnosis not present

## 2015-10-12 DIAGNOSIS — M5430 Sciatica, unspecified side: Secondary | ICD-10-CM | POA: Diagnosis not present

## 2015-10-12 DIAGNOSIS — K219 Gastro-esophageal reflux disease without esophagitis: Secondary | ICD-10-CM | POA: Diagnosis not present

## 2015-10-12 DIAGNOSIS — I10 Essential (primary) hypertension: Secondary | ICD-10-CM | POA: Diagnosis not present

## 2015-10-13 DIAGNOSIS — I10 Essential (primary) hypertension: Secondary | ICD-10-CM | POA: Diagnosis not present

## 2015-10-13 DIAGNOSIS — M5430 Sciatica, unspecified side: Secondary | ICD-10-CM | POA: Diagnosis not present

## 2015-10-13 DIAGNOSIS — K219 Gastro-esophageal reflux disease without esophagitis: Secondary | ICD-10-CM | POA: Diagnosis not present

## 2015-10-14 DIAGNOSIS — I10 Essential (primary) hypertension: Secondary | ICD-10-CM | POA: Diagnosis not present

## 2015-10-14 DIAGNOSIS — M5430 Sciatica, unspecified side: Secondary | ICD-10-CM | POA: Diagnosis not present

## 2015-10-14 DIAGNOSIS — K219 Gastro-esophageal reflux disease without esophagitis: Secondary | ICD-10-CM | POA: Diagnosis not present

## 2015-10-15 DIAGNOSIS — M5430 Sciatica, unspecified side: Secondary | ICD-10-CM | POA: Diagnosis not present

## 2015-10-15 DIAGNOSIS — K219 Gastro-esophageal reflux disease without esophagitis: Secondary | ICD-10-CM | POA: Diagnosis not present

## 2015-10-15 DIAGNOSIS — I10 Essential (primary) hypertension: Secondary | ICD-10-CM | POA: Diagnosis not present

## 2015-10-16 DIAGNOSIS — I10 Essential (primary) hypertension: Secondary | ICD-10-CM | POA: Diagnosis not present

## 2015-10-16 DIAGNOSIS — M5430 Sciatica, unspecified side: Secondary | ICD-10-CM | POA: Diagnosis not present

## 2015-10-16 DIAGNOSIS — K219 Gastro-esophageal reflux disease without esophagitis: Secondary | ICD-10-CM | POA: Diagnosis not present

## 2015-10-17 DIAGNOSIS — I10 Essential (primary) hypertension: Secondary | ICD-10-CM | POA: Diagnosis not present

## 2015-10-17 DIAGNOSIS — K219 Gastro-esophageal reflux disease without esophagitis: Secondary | ICD-10-CM | POA: Diagnosis not present

## 2015-10-17 DIAGNOSIS — M5430 Sciatica, unspecified side: Secondary | ICD-10-CM | POA: Diagnosis not present

## 2015-10-18 DIAGNOSIS — I10 Essential (primary) hypertension: Secondary | ICD-10-CM | POA: Diagnosis not present

## 2015-10-18 DIAGNOSIS — K219 Gastro-esophageal reflux disease without esophagitis: Secondary | ICD-10-CM | POA: Diagnosis not present

## 2015-10-18 DIAGNOSIS — M5430 Sciatica, unspecified side: Secondary | ICD-10-CM | POA: Diagnosis not present

## 2015-10-19 DIAGNOSIS — I10 Essential (primary) hypertension: Secondary | ICD-10-CM | POA: Diagnosis not present

## 2015-10-19 DIAGNOSIS — K219 Gastro-esophageal reflux disease without esophagitis: Secondary | ICD-10-CM | POA: Diagnosis not present

## 2015-10-19 DIAGNOSIS — M5430 Sciatica, unspecified side: Secondary | ICD-10-CM | POA: Diagnosis not present

## 2015-10-20 DIAGNOSIS — K219 Gastro-esophageal reflux disease without esophagitis: Secondary | ICD-10-CM | POA: Diagnosis not present

## 2015-10-20 DIAGNOSIS — M5430 Sciatica, unspecified side: Secondary | ICD-10-CM | POA: Diagnosis not present

## 2015-10-20 DIAGNOSIS — I10 Essential (primary) hypertension: Secondary | ICD-10-CM | POA: Diagnosis not present

## 2015-10-21 DIAGNOSIS — K219 Gastro-esophageal reflux disease without esophagitis: Secondary | ICD-10-CM | POA: Diagnosis not present

## 2015-10-21 DIAGNOSIS — I10 Essential (primary) hypertension: Secondary | ICD-10-CM | POA: Diagnosis not present

## 2015-10-21 DIAGNOSIS — M5430 Sciatica, unspecified side: Secondary | ICD-10-CM | POA: Diagnosis not present

## 2015-10-22 DIAGNOSIS — M5430 Sciatica, unspecified side: Secondary | ICD-10-CM | POA: Diagnosis not present

## 2015-10-22 DIAGNOSIS — K219 Gastro-esophageal reflux disease without esophagitis: Secondary | ICD-10-CM | POA: Diagnosis not present

## 2015-10-22 DIAGNOSIS — I10 Essential (primary) hypertension: Secondary | ICD-10-CM | POA: Diagnosis not present

## 2015-10-23 DIAGNOSIS — K219 Gastro-esophageal reflux disease without esophagitis: Secondary | ICD-10-CM | POA: Diagnosis not present

## 2015-10-23 DIAGNOSIS — I10 Essential (primary) hypertension: Secondary | ICD-10-CM | POA: Diagnosis not present

## 2015-10-23 DIAGNOSIS — M5430 Sciatica, unspecified side: Secondary | ICD-10-CM | POA: Diagnosis not present

## 2015-10-24 DIAGNOSIS — K219 Gastro-esophageal reflux disease without esophagitis: Secondary | ICD-10-CM | POA: Diagnosis not present

## 2015-10-24 DIAGNOSIS — I10 Essential (primary) hypertension: Secondary | ICD-10-CM | POA: Diagnosis not present

## 2015-10-24 DIAGNOSIS — M5430 Sciatica, unspecified side: Secondary | ICD-10-CM | POA: Diagnosis not present

## 2015-10-25 DIAGNOSIS — K219 Gastro-esophageal reflux disease without esophagitis: Secondary | ICD-10-CM | POA: Diagnosis not present

## 2015-10-25 DIAGNOSIS — M5430 Sciatica, unspecified side: Secondary | ICD-10-CM | POA: Diagnosis not present

## 2015-10-25 DIAGNOSIS — I10 Essential (primary) hypertension: Secondary | ICD-10-CM | POA: Diagnosis not present

## 2015-10-26 DIAGNOSIS — I10 Essential (primary) hypertension: Secondary | ICD-10-CM | POA: Diagnosis not present

## 2015-10-26 DIAGNOSIS — K219 Gastro-esophageal reflux disease without esophagitis: Secondary | ICD-10-CM | POA: Diagnosis not present

## 2015-10-26 DIAGNOSIS — M5430 Sciatica, unspecified side: Secondary | ICD-10-CM | POA: Diagnosis not present

## 2015-10-27 DIAGNOSIS — I10 Essential (primary) hypertension: Secondary | ICD-10-CM | POA: Diagnosis not present

## 2015-10-27 DIAGNOSIS — M5430 Sciatica, unspecified side: Secondary | ICD-10-CM | POA: Diagnosis not present

## 2015-10-27 DIAGNOSIS — K219 Gastro-esophageal reflux disease without esophagitis: Secondary | ICD-10-CM | POA: Diagnosis not present

## 2015-10-28 DIAGNOSIS — I10 Essential (primary) hypertension: Secondary | ICD-10-CM | POA: Diagnosis not present

## 2015-10-28 DIAGNOSIS — M5430 Sciatica, unspecified side: Secondary | ICD-10-CM | POA: Diagnosis not present

## 2015-10-28 DIAGNOSIS — K219 Gastro-esophageal reflux disease without esophagitis: Secondary | ICD-10-CM | POA: Diagnosis not present

## 2015-10-29 DIAGNOSIS — M5430 Sciatica, unspecified side: Secondary | ICD-10-CM | POA: Diagnosis not present

## 2015-10-29 DIAGNOSIS — I10 Essential (primary) hypertension: Secondary | ICD-10-CM | POA: Diagnosis not present

## 2015-10-29 DIAGNOSIS — K219 Gastro-esophageal reflux disease without esophagitis: Secondary | ICD-10-CM | POA: Diagnosis not present

## 2015-10-30 DIAGNOSIS — M5430 Sciatica, unspecified side: Secondary | ICD-10-CM | POA: Diagnosis not present

## 2015-10-30 DIAGNOSIS — K219 Gastro-esophageal reflux disease without esophagitis: Secondary | ICD-10-CM | POA: Diagnosis not present

## 2015-10-30 DIAGNOSIS — I10 Essential (primary) hypertension: Secondary | ICD-10-CM | POA: Diagnosis not present

## 2015-10-31 DIAGNOSIS — M5430 Sciatica, unspecified side: Secondary | ICD-10-CM | POA: Diagnosis not present

## 2015-10-31 DIAGNOSIS — K219 Gastro-esophageal reflux disease without esophagitis: Secondary | ICD-10-CM | POA: Diagnosis not present

## 2015-10-31 DIAGNOSIS — I10 Essential (primary) hypertension: Secondary | ICD-10-CM | POA: Diagnosis not present

## 2015-11-01 DIAGNOSIS — K219 Gastro-esophageal reflux disease without esophagitis: Secondary | ICD-10-CM | POA: Diagnosis not present

## 2015-11-01 DIAGNOSIS — I10 Essential (primary) hypertension: Secondary | ICD-10-CM | POA: Diagnosis not present

## 2015-11-01 DIAGNOSIS — M5430 Sciatica, unspecified side: Secondary | ICD-10-CM | POA: Diagnosis not present

## 2015-11-02 DIAGNOSIS — I10 Essential (primary) hypertension: Secondary | ICD-10-CM | POA: Diagnosis not present

## 2015-11-02 DIAGNOSIS — K219 Gastro-esophageal reflux disease without esophagitis: Secondary | ICD-10-CM | POA: Diagnosis not present

## 2015-11-02 DIAGNOSIS — M5430 Sciatica, unspecified side: Secondary | ICD-10-CM | POA: Diagnosis not present

## 2015-11-03 DIAGNOSIS — K219 Gastro-esophageal reflux disease without esophagitis: Secondary | ICD-10-CM | POA: Diagnosis not present

## 2015-11-03 DIAGNOSIS — I10 Essential (primary) hypertension: Secondary | ICD-10-CM | POA: Diagnosis not present

## 2015-11-03 DIAGNOSIS — M5430 Sciatica, unspecified side: Secondary | ICD-10-CM | POA: Diagnosis not present

## 2015-11-04 DIAGNOSIS — I10 Essential (primary) hypertension: Secondary | ICD-10-CM | POA: Diagnosis not present

## 2015-11-04 DIAGNOSIS — K219 Gastro-esophageal reflux disease without esophagitis: Secondary | ICD-10-CM | POA: Diagnosis not present

## 2015-11-04 DIAGNOSIS — M5430 Sciatica, unspecified side: Secondary | ICD-10-CM | POA: Diagnosis not present

## 2015-11-05 DIAGNOSIS — I10 Essential (primary) hypertension: Secondary | ICD-10-CM | POA: Diagnosis not present

## 2015-11-05 DIAGNOSIS — M5430 Sciatica, unspecified side: Secondary | ICD-10-CM | POA: Diagnosis not present

## 2015-11-05 DIAGNOSIS — K219 Gastro-esophageal reflux disease without esophagitis: Secondary | ICD-10-CM | POA: Diagnosis not present

## 2015-11-06 DIAGNOSIS — M5430 Sciatica, unspecified side: Secondary | ICD-10-CM | POA: Diagnosis not present

## 2015-11-06 DIAGNOSIS — I10 Essential (primary) hypertension: Secondary | ICD-10-CM | POA: Diagnosis not present

## 2015-11-06 DIAGNOSIS — K219 Gastro-esophageal reflux disease without esophagitis: Secondary | ICD-10-CM | POA: Diagnosis not present

## 2015-11-07 ENCOUNTER — Ambulatory Visit: Payer: Self-pay | Admitting: Internal Medicine

## 2015-11-07 DIAGNOSIS — I10 Essential (primary) hypertension: Secondary | ICD-10-CM | POA: Diagnosis not present

## 2015-11-07 DIAGNOSIS — K219 Gastro-esophageal reflux disease without esophagitis: Secondary | ICD-10-CM | POA: Diagnosis not present

## 2015-11-07 DIAGNOSIS — M5430 Sciatica, unspecified side: Secondary | ICD-10-CM | POA: Diagnosis not present

## 2015-11-08 DIAGNOSIS — M5430 Sciatica, unspecified side: Secondary | ICD-10-CM | POA: Diagnosis not present

## 2015-11-08 DIAGNOSIS — K219 Gastro-esophageal reflux disease without esophagitis: Secondary | ICD-10-CM | POA: Diagnosis not present

## 2015-11-08 DIAGNOSIS — I10 Essential (primary) hypertension: Secondary | ICD-10-CM | POA: Diagnosis not present

## 2015-11-09 DIAGNOSIS — K219 Gastro-esophageal reflux disease without esophagitis: Secondary | ICD-10-CM | POA: Diagnosis not present

## 2015-11-09 DIAGNOSIS — I10 Essential (primary) hypertension: Secondary | ICD-10-CM | POA: Diagnosis not present

## 2015-11-09 DIAGNOSIS — M5430 Sciatica, unspecified side: Secondary | ICD-10-CM | POA: Diagnosis not present

## 2015-11-10 DIAGNOSIS — I10 Essential (primary) hypertension: Secondary | ICD-10-CM | POA: Diagnosis not present

## 2015-11-10 DIAGNOSIS — K219 Gastro-esophageal reflux disease without esophagitis: Secondary | ICD-10-CM | POA: Diagnosis not present

## 2015-11-10 DIAGNOSIS — M5430 Sciatica, unspecified side: Secondary | ICD-10-CM | POA: Diagnosis not present

## 2015-11-11 DIAGNOSIS — I10 Essential (primary) hypertension: Secondary | ICD-10-CM | POA: Diagnosis not present

## 2015-11-11 DIAGNOSIS — K219 Gastro-esophageal reflux disease without esophagitis: Secondary | ICD-10-CM | POA: Diagnosis not present

## 2015-11-11 DIAGNOSIS — M5430 Sciatica, unspecified side: Secondary | ICD-10-CM | POA: Diagnosis not present

## 2015-11-12 DIAGNOSIS — M5430 Sciatica, unspecified side: Secondary | ICD-10-CM | POA: Diagnosis not present

## 2015-11-12 DIAGNOSIS — K219 Gastro-esophageal reflux disease without esophagitis: Secondary | ICD-10-CM | POA: Diagnosis not present

## 2015-11-12 DIAGNOSIS — I10 Essential (primary) hypertension: Secondary | ICD-10-CM | POA: Diagnosis not present

## 2015-11-13 DIAGNOSIS — K219 Gastro-esophageal reflux disease without esophagitis: Secondary | ICD-10-CM | POA: Diagnosis not present

## 2015-11-13 DIAGNOSIS — M5430 Sciatica, unspecified side: Secondary | ICD-10-CM | POA: Diagnosis not present

## 2015-11-13 DIAGNOSIS — I10 Essential (primary) hypertension: Secondary | ICD-10-CM | POA: Diagnosis not present

## 2015-11-14 DIAGNOSIS — I10 Essential (primary) hypertension: Secondary | ICD-10-CM | POA: Diagnosis not present

## 2015-11-14 DIAGNOSIS — K219 Gastro-esophageal reflux disease without esophagitis: Secondary | ICD-10-CM | POA: Diagnosis not present

## 2015-11-14 DIAGNOSIS — M5430 Sciatica, unspecified side: Secondary | ICD-10-CM | POA: Diagnosis not present

## 2015-11-15 DIAGNOSIS — K219 Gastro-esophageal reflux disease without esophagitis: Secondary | ICD-10-CM | POA: Diagnosis not present

## 2015-11-15 DIAGNOSIS — M5430 Sciatica, unspecified side: Secondary | ICD-10-CM | POA: Diagnosis not present

## 2015-11-15 DIAGNOSIS — I10 Essential (primary) hypertension: Secondary | ICD-10-CM | POA: Diagnosis not present

## 2015-11-16 DIAGNOSIS — I10 Essential (primary) hypertension: Secondary | ICD-10-CM | POA: Diagnosis not present

## 2015-11-16 DIAGNOSIS — M5430 Sciatica, unspecified side: Secondary | ICD-10-CM | POA: Diagnosis not present

## 2015-11-16 DIAGNOSIS — K219 Gastro-esophageal reflux disease without esophagitis: Secondary | ICD-10-CM | POA: Diagnosis not present

## 2015-11-17 DIAGNOSIS — I10 Essential (primary) hypertension: Secondary | ICD-10-CM | POA: Diagnosis not present

## 2015-11-17 DIAGNOSIS — K219 Gastro-esophageal reflux disease without esophagitis: Secondary | ICD-10-CM | POA: Diagnosis not present

## 2015-11-17 DIAGNOSIS — M5430 Sciatica, unspecified side: Secondary | ICD-10-CM | POA: Diagnosis not present

## 2015-11-18 DIAGNOSIS — M5430 Sciatica, unspecified side: Secondary | ICD-10-CM | POA: Diagnosis not present

## 2015-11-18 DIAGNOSIS — K219 Gastro-esophageal reflux disease without esophagitis: Secondary | ICD-10-CM | POA: Diagnosis not present

## 2015-11-18 DIAGNOSIS — I10 Essential (primary) hypertension: Secondary | ICD-10-CM | POA: Diagnosis not present

## 2015-11-26 DIAGNOSIS — I10 Essential (primary) hypertension: Secondary | ICD-10-CM | POA: Diagnosis not present

## 2015-11-26 DIAGNOSIS — K219 Gastro-esophageal reflux disease without esophagitis: Secondary | ICD-10-CM | POA: Diagnosis not present

## 2015-11-26 DIAGNOSIS — M5430 Sciatica, unspecified side: Secondary | ICD-10-CM | POA: Diagnosis not present

## 2015-11-27 DIAGNOSIS — I10 Essential (primary) hypertension: Secondary | ICD-10-CM | POA: Diagnosis not present

## 2015-11-27 DIAGNOSIS — K219 Gastro-esophageal reflux disease without esophagitis: Secondary | ICD-10-CM | POA: Diagnosis not present

## 2015-11-27 DIAGNOSIS — M5430 Sciatica, unspecified side: Secondary | ICD-10-CM | POA: Diagnosis not present

## 2015-11-28 DIAGNOSIS — I10 Essential (primary) hypertension: Secondary | ICD-10-CM | POA: Diagnosis not present

## 2015-11-28 DIAGNOSIS — M5430 Sciatica, unspecified side: Secondary | ICD-10-CM | POA: Diagnosis not present

## 2015-11-28 DIAGNOSIS — K219 Gastro-esophageal reflux disease without esophagitis: Secondary | ICD-10-CM | POA: Diagnosis not present

## 2015-11-29 DIAGNOSIS — M5430 Sciatica, unspecified side: Secondary | ICD-10-CM | POA: Diagnosis not present

## 2015-11-29 DIAGNOSIS — K219 Gastro-esophageal reflux disease without esophagitis: Secondary | ICD-10-CM | POA: Diagnosis not present

## 2015-11-29 DIAGNOSIS — I10 Essential (primary) hypertension: Secondary | ICD-10-CM | POA: Diagnosis not present

## 2015-11-30 DIAGNOSIS — I10 Essential (primary) hypertension: Secondary | ICD-10-CM | POA: Diagnosis not present

## 2015-11-30 DIAGNOSIS — M5430 Sciatica, unspecified side: Secondary | ICD-10-CM | POA: Diagnosis not present

## 2015-11-30 DIAGNOSIS — K219 Gastro-esophageal reflux disease without esophagitis: Secondary | ICD-10-CM | POA: Diagnosis not present

## 2015-12-01 DIAGNOSIS — M5430 Sciatica, unspecified side: Secondary | ICD-10-CM | POA: Diagnosis not present

## 2015-12-01 DIAGNOSIS — K219 Gastro-esophageal reflux disease without esophagitis: Secondary | ICD-10-CM | POA: Diagnosis not present

## 2015-12-01 DIAGNOSIS — I10 Essential (primary) hypertension: Secondary | ICD-10-CM | POA: Diagnosis not present

## 2015-12-02 DIAGNOSIS — I10 Essential (primary) hypertension: Secondary | ICD-10-CM | POA: Diagnosis not present

## 2015-12-02 DIAGNOSIS — K219 Gastro-esophageal reflux disease without esophagitis: Secondary | ICD-10-CM | POA: Diagnosis not present

## 2015-12-02 DIAGNOSIS — M5430 Sciatica, unspecified side: Secondary | ICD-10-CM | POA: Diagnosis not present

## 2015-12-03 DIAGNOSIS — K219 Gastro-esophageal reflux disease without esophagitis: Secondary | ICD-10-CM | POA: Diagnosis not present

## 2015-12-03 DIAGNOSIS — I10 Essential (primary) hypertension: Secondary | ICD-10-CM | POA: Diagnosis not present

## 2015-12-03 DIAGNOSIS — M5430 Sciatica, unspecified side: Secondary | ICD-10-CM | POA: Diagnosis not present

## 2015-12-04 DIAGNOSIS — K219 Gastro-esophageal reflux disease without esophagitis: Secondary | ICD-10-CM | POA: Diagnosis not present

## 2015-12-04 DIAGNOSIS — I10 Essential (primary) hypertension: Secondary | ICD-10-CM | POA: Diagnosis not present

## 2015-12-04 DIAGNOSIS — M5430 Sciatica, unspecified side: Secondary | ICD-10-CM | POA: Diagnosis not present

## 2015-12-05 DIAGNOSIS — I10 Essential (primary) hypertension: Secondary | ICD-10-CM | POA: Diagnosis not present

## 2015-12-05 DIAGNOSIS — K219 Gastro-esophageal reflux disease without esophagitis: Secondary | ICD-10-CM | POA: Diagnosis not present

## 2015-12-05 DIAGNOSIS — M5430 Sciatica, unspecified side: Secondary | ICD-10-CM | POA: Diagnosis not present

## 2015-12-06 DIAGNOSIS — K219 Gastro-esophageal reflux disease without esophagitis: Secondary | ICD-10-CM | POA: Diagnosis not present

## 2015-12-06 DIAGNOSIS — M5430 Sciatica, unspecified side: Secondary | ICD-10-CM | POA: Diagnosis not present

## 2015-12-06 DIAGNOSIS — I10 Essential (primary) hypertension: Secondary | ICD-10-CM | POA: Diagnosis not present

## 2015-12-07 DIAGNOSIS — M5430 Sciatica, unspecified side: Secondary | ICD-10-CM | POA: Diagnosis not present

## 2015-12-07 DIAGNOSIS — K219 Gastro-esophageal reflux disease without esophagitis: Secondary | ICD-10-CM | POA: Diagnosis not present

## 2015-12-07 DIAGNOSIS — I10 Essential (primary) hypertension: Secondary | ICD-10-CM | POA: Diagnosis not present

## 2015-12-08 DIAGNOSIS — K219 Gastro-esophageal reflux disease without esophagitis: Secondary | ICD-10-CM | POA: Diagnosis not present

## 2015-12-08 DIAGNOSIS — I10 Essential (primary) hypertension: Secondary | ICD-10-CM | POA: Diagnosis not present

## 2015-12-08 DIAGNOSIS — M5430 Sciatica, unspecified side: Secondary | ICD-10-CM | POA: Diagnosis not present

## 2015-12-09 DIAGNOSIS — K219 Gastro-esophageal reflux disease without esophagitis: Secondary | ICD-10-CM | POA: Diagnosis not present

## 2015-12-09 DIAGNOSIS — M5430 Sciatica, unspecified side: Secondary | ICD-10-CM | POA: Diagnosis not present

## 2015-12-09 DIAGNOSIS — I10 Essential (primary) hypertension: Secondary | ICD-10-CM | POA: Diagnosis not present

## 2015-12-10 DIAGNOSIS — K219 Gastro-esophageal reflux disease without esophagitis: Secondary | ICD-10-CM | POA: Diagnosis not present

## 2015-12-10 DIAGNOSIS — M5430 Sciatica, unspecified side: Secondary | ICD-10-CM | POA: Diagnosis not present

## 2015-12-10 DIAGNOSIS — I10 Essential (primary) hypertension: Secondary | ICD-10-CM | POA: Diagnosis not present

## 2015-12-11 DIAGNOSIS — K219 Gastro-esophageal reflux disease without esophagitis: Secondary | ICD-10-CM | POA: Diagnosis not present

## 2015-12-11 DIAGNOSIS — I10 Essential (primary) hypertension: Secondary | ICD-10-CM | POA: Diagnosis not present

## 2015-12-11 DIAGNOSIS — M5430 Sciatica, unspecified side: Secondary | ICD-10-CM | POA: Diagnosis not present

## 2015-12-12 DIAGNOSIS — M5430 Sciatica, unspecified side: Secondary | ICD-10-CM | POA: Diagnosis not present

## 2015-12-12 DIAGNOSIS — I10 Essential (primary) hypertension: Secondary | ICD-10-CM | POA: Diagnosis not present

## 2015-12-12 DIAGNOSIS — K219 Gastro-esophageal reflux disease without esophagitis: Secondary | ICD-10-CM | POA: Diagnosis not present

## 2015-12-13 DIAGNOSIS — K219 Gastro-esophageal reflux disease without esophagitis: Secondary | ICD-10-CM | POA: Diagnosis not present

## 2015-12-13 DIAGNOSIS — I10 Essential (primary) hypertension: Secondary | ICD-10-CM | POA: Diagnosis not present

## 2015-12-13 DIAGNOSIS — M5430 Sciatica, unspecified side: Secondary | ICD-10-CM | POA: Diagnosis not present

## 2015-12-14 DIAGNOSIS — K219 Gastro-esophageal reflux disease without esophagitis: Secondary | ICD-10-CM | POA: Diagnosis not present

## 2015-12-14 DIAGNOSIS — I10 Essential (primary) hypertension: Secondary | ICD-10-CM | POA: Diagnosis not present

## 2015-12-14 DIAGNOSIS — M5430 Sciatica, unspecified side: Secondary | ICD-10-CM | POA: Diagnosis not present

## 2015-12-15 DIAGNOSIS — M5430 Sciatica, unspecified side: Secondary | ICD-10-CM | POA: Diagnosis not present

## 2015-12-15 DIAGNOSIS — K219 Gastro-esophageal reflux disease without esophagitis: Secondary | ICD-10-CM | POA: Diagnosis not present

## 2015-12-15 DIAGNOSIS — I10 Essential (primary) hypertension: Secondary | ICD-10-CM | POA: Diagnosis not present

## 2015-12-16 DIAGNOSIS — I10 Essential (primary) hypertension: Secondary | ICD-10-CM | POA: Diagnosis not present

## 2015-12-16 DIAGNOSIS — K219 Gastro-esophageal reflux disease without esophagitis: Secondary | ICD-10-CM | POA: Diagnosis not present

## 2015-12-16 DIAGNOSIS — M5430 Sciatica, unspecified side: Secondary | ICD-10-CM | POA: Diagnosis not present

## 2015-12-17 DIAGNOSIS — M5430 Sciatica, unspecified side: Secondary | ICD-10-CM | POA: Diagnosis not present

## 2015-12-17 DIAGNOSIS — K219 Gastro-esophageal reflux disease without esophagitis: Secondary | ICD-10-CM | POA: Diagnosis not present

## 2015-12-17 DIAGNOSIS — I10 Essential (primary) hypertension: Secondary | ICD-10-CM | POA: Diagnosis not present

## 2015-12-18 DIAGNOSIS — K219 Gastro-esophageal reflux disease without esophagitis: Secondary | ICD-10-CM | POA: Diagnosis not present

## 2015-12-18 DIAGNOSIS — I10 Essential (primary) hypertension: Secondary | ICD-10-CM | POA: Diagnosis not present

## 2015-12-18 DIAGNOSIS — M5430 Sciatica, unspecified side: Secondary | ICD-10-CM | POA: Diagnosis not present

## 2015-12-19 DIAGNOSIS — I10 Essential (primary) hypertension: Secondary | ICD-10-CM | POA: Diagnosis not present

## 2015-12-19 DIAGNOSIS — M5430 Sciatica, unspecified side: Secondary | ICD-10-CM | POA: Diagnosis not present

## 2015-12-19 DIAGNOSIS — K219 Gastro-esophageal reflux disease without esophagitis: Secondary | ICD-10-CM | POA: Diagnosis not present

## 2015-12-20 DIAGNOSIS — I10 Essential (primary) hypertension: Secondary | ICD-10-CM | POA: Diagnosis not present

## 2015-12-20 DIAGNOSIS — K219 Gastro-esophageal reflux disease without esophagitis: Secondary | ICD-10-CM | POA: Diagnosis not present

## 2015-12-20 DIAGNOSIS — M5430 Sciatica, unspecified side: Secondary | ICD-10-CM | POA: Diagnosis not present

## 2015-12-21 DIAGNOSIS — I10 Essential (primary) hypertension: Secondary | ICD-10-CM | POA: Diagnosis not present

## 2015-12-21 DIAGNOSIS — M5430 Sciatica, unspecified side: Secondary | ICD-10-CM | POA: Diagnosis not present

## 2015-12-21 DIAGNOSIS — K219 Gastro-esophageal reflux disease without esophagitis: Secondary | ICD-10-CM | POA: Diagnosis not present

## 2015-12-22 DIAGNOSIS — M5430 Sciatica, unspecified side: Secondary | ICD-10-CM | POA: Diagnosis not present

## 2015-12-22 DIAGNOSIS — K219 Gastro-esophageal reflux disease without esophagitis: Secondary | ICD-10-CM | POA: Diagnosis not present

## 2015-12-22 DIAGNOSIS — I10 Essential (primary) hypertension: Secondary | ICD-10-CM | POA: Diagnosis not present

## 2015-12-23 DIAGNOSIS — I10 Essential (primary) hypertension: Secondary | ICD-10-CM | POA: Diagnosis not present

## 2015-12-23 DIAGNOSIS — K219 Gastro-esophageal reflux disease without esophagitis: Secondary | ICD-10-CM | POA: Diagnosis not present

## 2015-12-23 DIAGNOSIS — M5430 Sciatica, unspecified side: Secondary | ICD-10-CM | POA: Diagnosis not present

## 2015-12-24 DIAGNOSIS — I10 Essential (primary) hypertension: Secondary | ICD-10-CM | POA: Diagnosis not present

## 2015-12-24 DIAGNOSIS — K219 Gastro-esophageal reflux disease without esophagitis: Secondary | ICD-10-CM | POA: Diagnosis not present

## 2015-12-24 DIAGNOSIS — M5430 Sciatica, unspecified side: Secondary | ICD-10-CM | POA: Diagnosis not present

## 2015-12-25 DIAGNOSIS — K219 Gastro-esophageal reflux disease without esophagitis: Secondary | ICD-10-CM | POA: Diagnosis not present

## 2015-12-25 DIAGNOSIS — M5430 Sciatica, unspecified side: Secondary | ICD-10-CM | POA: Diagnosis not present

## 2015-12-25 DIAGNOSIS — I10 Essential (primary) hypertension: Secondary | ICD-10-CM | POA: Diagnosis not present

## 2015-12-26 DIAGNOSIS — K219 Gastro-esophageal reflux disease without esophagitis: Secondary | ICD-10-CM | POA: Diagnosis not present

## 2015-12-26 DIAGNOSIS — M5430 Sciatica, unspecified side: Secondary | ICD-10-CM | POA: Diagnosis not present

## 2015-12-26 DIAGNOSIS — I10 Essential (primary) hypertension: Secondary | ICD-10-CM | POA: Diagnosis not present

## 2015-12-27 DIAGNOSIS — M5430 Sciatica, unspecified side: Secondary | ICD-10-CM | POA: Diagnosis not present

## 2015-12-27 DIAGNOSIS — I10 Essential (primary) hypertension: Secondary | ICD-10-CM | POA: Diagnosis not present

## 2015-12-27 DIAGNOSIS — K219 Gastro-esophageal reflux disease without esophagitis: Secondary | ICD-10-CM | POA: Diagnosis not present

## 2015-12-28 DIAGNOSIS — I10 Essential (primary) hypertension: Secondary | ICD-10-CM | POA: Diagnosis not present

## 2015-12-28 DIAGNOSIS — M5430 Sciatica, unspecified side: Secondary | ICD-10-CM | POA: Diagnosis not present

## 2015-12-28 DIAGNOSIS — K219 Gastro-esophageal reflux disease without esophagitis: Secondary | ICD-10-CM | POA: Diagnosis not present

## 2015-12-29 DIAGNOSIS — K219 Gastro-esophageal reflux disease without esophagitis: Secondary | ICD-10-CM | POA: Diagnosis not present

## 2015-12-29 DIAGNOSIS — M5430 Sciatica, unspecified side: Secondary | ICD-10-CM | POA: Diagnosis not present

## 2015-12-29 DIAGNOSIS — I10 Essential (primary) hypertension: Secondary | ICD-10-CM | POA: Diagnosis not present

## 2015-12-30 DIAGNOSIS — I10 Essential (primary) hypertension: Secondary | ICD-10-CM | POA: Diagnosis not present

## 2015-12-30 DIAGNOSIS — K219 Gastro-esophageal reflux disease without esophagitis: Secondary | ICD-10-CM | POA: Diagnosis not present

## 2015-12-30 DIAGNOSIS — M5430 Sciatica, unspecified side: Secondary | ICD-10-CM | POA: Diagnosis not present

## 2015-12-31 DIAGNOSIS — M5430 Sciatica, unspecified side: Secondary | ICD-10-CM | POA: Diagnosis not present

## 2015-12-31 DIAGNOSIS — I10 Essential (primary) hypertension: Secondary | ICD-10-CM | POA: Diagnosis not present

## 2015-12-31 DIAGNOSIS — K219 Gastro-esophageal reflux disease without esophagitis: Secondary | ICD-10-CM | POA: Diagnosis not present

## 2016-01-01 DIAGNOSIS — K219 Gastro-esophageal reflux disease without esophagitis: Secondary | ICD-10-CM | POA: Diagnosis not present

## 2016-01-01 DIAGNOSIS — I10 Essential (primary) hypertension: Secondary | ICD-10-CM | POA: Diagnosis not present

## 2016-01-01 DIAGNOSIS — M5430 Sciatica, unspecified side: Secondary | ICD-10-CM | POA: Diagnosis not present

## 2016-01-02 DIAGNOSIS — I10 Essential (primary) hypertension: Secondary | ICD-10-CM | POA: Diagnosis not present

## 2016-01-02 DIAGNOSIS — K219 Gastro-esophageal reflux disease without esophagitis: Secondary | ICD-10-CM | POA: Diagnosis not present

## 2016-01-02 DIAGNOSIS — M5430 Sciatica, unspecified side: Secondary | ICD-10-CM | POA: Diagnosis not present

## 2016-01-03 DIAGNOSIS — K219 Gastro-esophageal reflux disease without esophagitis: Secondary | ICD-10-CM | POA: Diagnosis not present

## 2016-01-03 DIAGNOSIS — I10 Essential (primary) hypertension: Secondary | ICD-10-CM | POA: Diagnosis not present

## 2016-01-03 DIAGNOSIS — M5430 Sciatica, unspecified side: Secondary | ICD-10-CM | POA: Diagnosis not present

## 2016-01-05 DIAGNOSIS — K219 Gastro-esophageal reflux disease without esophagitis: Secondary | ICD-10-CM | POA: Diagnosis not present

## 2016-01-05 DIAGNOSIS — M5430 Sciatica, unspecified side: Secondary | ICD-10-CM | POA: Diagnosis not present

## 2016-01-05 DIAGNOSIS — I10 Essential (primary) hypertension: Secondary | ICD-10-CM | POA: Diagnosis not present

## 2016-01-06 DIAGNOSIS — K219 Gastro-esophageal reflux disease without esophagitis: Secondary | ICD-10-CM | POA: Diagnosis not present

## 2016-01-06 DIAGNOSIS — M5430 Sciatica, unspecified side: Secondary | ICD-10-CM | POA: Diagnosis not present

## 2016-01-06 DIAGNOSIS — I10 Essential (primary) hypertension: Secondary | ICD-10-CM | POA: Diagnosis not present

## 2016-01-07 DIAGNOSIS — I10 Essential (primary) hypertension: Secondary | ICD-10-CM | POA: Diagnosis not present

## 2016-01-07 DIAGNOSIS — M5430 Sciatica, unspecified side: Secondary | ICD-10-CM | POA: Diagnosis not present

## 2016-01-07 DIAGNOSIS — K219 Gastro-esophageal reflux disease without esophagitis: Secondary | ICD-10-CM | POA: Diagnosis not present

## 2016-01-08 DIAGNOSIS — M5430 Sciatica, unspecified side: Secondary | ICD-10-CM | POA: Diagnosis not present

## 2016-01-08 DIAGNOSIS — I10 Essential (primary) hypertension: Secondary | ICD-10-CM | POA: Diagnosis not present

## 2016-01-08 DIAGNOSIS — K219 Gastro-esophageal reflux disease without esophagitis: Secondary | ICD-10-CM | POA: Diagnosis not present

## 2016-01-09 DIAGNOSIS — M5430 Sciatica, unspecified side: Secondary | ICD-10-CM | POA: Diagnosis not present

## 2016-01-09 DIAGNOSIS — K219 Gastro-esophageal reflux disease without esophagitis: Secondary | ICD-10-CM | POA: Diagnosis not present

## 2016-01-09 DIAGNOSIS — I10 Essential (primary) hypertension: Secondary | ICD-10-CM | POA: Diagnosis not present

## 2016-01-10 DIAGNOSIS — K219 Gastro-esophageal reflux disease without esophagitis: Secondary | ICD-10-CM | POA: Diagnosis not present

## 2016-01-10 DIAGNOSIS — M5430 Sciatica, unspecified side: Secondary | ICD-10-CM | POA: Diagnosis not present

## 2016-01-10 DIAGNOSIS — I10 Essential (primary) hypertension: Secondary | ICD-10-CM | POA: Diagnosis not present

## 2016-01-11 DIAGNOSIS — K219 Gastro-esophageal reflux disease without esophagitis: Secondary | ICD-10-CM | POA: Diagnosis not present

## 2016-01-11 DIAGNOSIS — M5430 Sciatica, unspecified side: Secondary | ICD-10-CM | POA: Diagnosis not present

## 2016-01-11 DIAGNOSIS — I10 Essential (primary) hypertension: Secondary | ICD-10-CM | POA: Diagnosis not present

## 2016-01-12 DIAGNOSIS — K219 Gastro-esophageal reflux disease without esophagitis: Secondary | ICD-10-CM | POA: Diagnosis not present

## 2016-01-12 DIAGNOSIS — M5430 Sciatica, unspecified side: Secondary | ICD-10-CM | POA: Diagnosis not present

## 2016-01-12 DIAGNOSIS — I10 Essential (primary) hypertension: Secondary | ICD-10-CM | POA: Diagnosis not present

## 2016-01-13 DIAGNOSIS — I10 Essential (primary) hypertension: Secondary | ICD-10-CM | POA: Diagnosis not present

## 2016-01-13 DIAGNOSIS — M5430 Sciatica, unspecified side: Secondary | ICD-10-CM | POA: Diagnosis not present

## 2016-01-13 DIAGNOSIS — K219 Gastro-esophageal reflux disease without esophagitis: Secondary | ICD-10-CM | POA: Diagnosis not present

## 2016-01-14 DIAGNOSIS — I10 Essential (primary) hypertension: Secondary | ICD-10-CM | POA: Diagnosis not present

## 2016-01-14 DIAGNOSIS — M5430 Sciatica, unspecified side: Secondary | ICD-10-CM | POA: Diagnosis not present

## 2016-01-14 DIAGNOSIS — K219 Gastro-esophageal reflux disease without esophagitis: Secondary | ICD-10-CM | POA: Diagnosis not present

## 2016-01-15 DIAGNOSIS — I10 Essential (primary) hypertension: Secondary | ICD-10-CM | POA: Diagnosis not present

## 2016-01-15 DIAGNOSIS — M5430 Sciatica, unspecified side: Secondary | ICD-10-CM | POA: Diagnosis not present

## 2016-01-15 DIAGNOSIS — K219 Gastro-esophageal reflux disease without esophagitis: Secondary | ICD-10-CM | POA: Diagnosis not present

## 2016-01-16 DIAGNOSIS — M5430 Sciatica, unspecified side: Secondary | ICD-10-CM | POA: Diagnosis not present

## 2016-01-16 DIAGNOSIS — I10 Essential (primary) hypertension: Secondary | ICD-10-CM | POA: Diagnosis not present

## 2016-01-16 DIAGNOSIS — K219 Gastro-esophageal reflux disease without esophagitis: Secondary | ICD-10-CM | POA: Diagnosis not present

## 2016-01-17 DIAGNOSIS — M5430 Sciatica, unspecified side: Secondary | ICD-10-CM | POA: Diagnosis not present

## 2016-01-17 DIAGNOSIS — K219 Gastro-esophageal reflux disease without esophagitis: Secondary | ICD-10-CM | POA: Diagnosis not present

## 2016-01-17 DIAGNOSIS — I10 Essential (primary) hypertension: Secondary | ICD-10-CM | POA: Diagnosis not present

## 2016-01-18 DIAGNOSIS — M5430 Sciatica, unspecified side: Secondary | ICD-10-CM | POA: Diagnosis not present

## 2016-01-18 DIAGNOSIS — K219 Gastro-esophageal reflux disease without esophagitis: Secondary | ICD-10-CM | POA: Diagnosis not present

## 2016-01-18 DIAGNOSIS — I10 Essential (primary) hypertension: Secondary | ICD-10-CM | POA: Diagnosis not present

## 2016-01-19 DIAGNOSIS — I10 Essential (primary) hypertension: Secondary | ICD-10-CM | POA: Diagnosis not present

## 2016-01-19 DIAGNOSIS — M5430 Sciatica, unspecified side: Secondary | ICD-10-CM | POA: Diagnosis not present

## 2016-01-19 DIAGNOSIS — K219 Gastro-esophageal reflux disease without esophagitis: Secondary | ICD-10-CM | POA: Diagnosis not present

## 2016-01-20 DIAGNOSIS — M5430 Sciatica, unspecified side: Secondary | ICD-10-CM | POA: Diagnosis not present

## 2016-01-20 DIAGNOSIS — I10 Essential (primary) hypertension: Secondary | ICD-10-CM | POA: Diagnosis not present

## 2016-01-20 DIAGNOSIS — K219 Gastro-esophageal reflux disease without esophagitis: Secondary | ICD-10-CM | POA: Diagnosis not present

## 2016-01-21 DIAGNOSIS — K219 Gastro-esophageal reflux disease without esophagitis: Secondary | ICD-10-CM | POA: Diagnosis not present

## 2016-01-21 DIAGNOSIS — M5430 Sciatica, unspecified side: Secondary | ICD-10-CM | POA: Diagnosis not present

## 2016-01-21 DIAGNOSIS — I10 Essential (primary) hypertension: Secondary | ICD-10-CM | POA: Diagnosis not present

## 2016-01-22 DIAGNOSIS — M5430 Sciatica, unspecified side: Secondary | ICD-10-CM | POA: Diagnosis not present

## 2016-01-22 DIAGNOSIS — K219 Gastro-esophageal reflux disease without esophagitis: Secondary | ICD-10-CM | POA: Diagnosis not present

## 2016-01-22 DIAGNOSIS — I10 Essential (primary) hypertension: Secondary | ICD-10-CM | POA: Diagnosis not present

## 2016-01-23 DIAGNOSIS — M5430 Sciatica, unspecified side: Secondary | ICD-10-CM | POA: Diagnosis not present

## 2016-01-23 DIAGNOSIS — I10 Essential (primary) hypertension: Secondary | ICD-10-CM | POA: Diagnosis not present

## 2016-01-23 DIAGNOSIS — K219 Gastro-esophageal reflux disease without esophagitis: Secondary | ICD-10-CM | POA: Diagnosis not present

## 2016-01-24 DIAGNOSIS — M5430 Sciatica, unspecified side: Secondary | ICD-10-CM | POA: Diagnosis not present

## 2016-01-24 DIAGNOSIS — I10 Essential (primary) hypertension: Secondary | ICD-10-CM | POA: Diagnosis not present

## 2016-01-24 DIAGNOSIS — K219 Gastro-esophageal reflux disease without esophagitis: Secondary | ICD-10-CM | POA: Diagnosis not present

## 2016-01-25 DIAGNOSIS — M5430 Sciatica, unspecified side: Secondary | ICD-10-CM | POA: Diagnosis not present

## 2016-01-25 DIAGNOSIS — K219 Gastro-esophageal reflux disease without esophagitis: Secondary | ICD-10-CM | POA: Diagnosis not present

## 2016-01-25 DIAGNOSIS — I10 Essential (primary) hypertension: Secondary | ICD-10-CM | POA: Diagnosis not present

## 2016-01-26 DIAGNOSIS — K219 Gastro-esophageal reflux disease without esophagitis: Secondary | ICD-10-CM | POA: Diagnosis not present

## 2016-01-26 DIAGNOSIS — M5430 Sciatica, unspecified side: Secondary | ICD-10-CM | POA: Diagnosis not present

## 2016-01-26 DIAGNOSIS — I10 Essential (primary) hypertension: Secondary | ICD-10-CM | POA: Diagnosis not present

## 2016-01-27 DIAGNOSIS — M5430 Sciatica, unspecified side: Secondary | ICD-10-CM | POA: Diagnosis not present

## 2016-01-27 DIAGNOSIS — I10 Essential (primary) hypertension: Secondary | ICD-10-CM | POA: Diagnosis not present

## 2016-01-27 DIAGNOSIS — K219 Gastro-esophageal reflux disease without esophagitis: Secondary | ICD-10-CM | POA: Diagnosis not present

## 2016-01-28 DIAGNOSIS — M5430 Sciatica, unspecified side: Secondary | ICD-10-CM | POA: Diagnosis not present

## 2016-01-28 DIAGNOSIS — I10 Essential (primary) hypertension: Secondary | ICD-10-CM | POA: Diagnosis not present

## 2016-01-28 DIAGNOSIS — K219 Gastro-esophageal reflux disease without esophagitis: Secondary | ICD-10-CM | POA: Diagnosis not present

## 2016-01-29 DIAGNOSIS — M5430 Sciatica, unspecified side: Secondary | ICD-10-CM | POA: Diagnosis not present

## 2016-01-29 DIAGNOSIS — I10 Essential (primary) hypertension: Secondary | ICD-10-CM | POA: Diagnosis not present

## 2016-01-29 DIAGNOSIS — K219 Gastro-esophageal reflux disease without esophagitis: Secondary | ICD-10-CM | POA: Diagnosis not present

## 2016-01-30 DIAGNOSIS — I10 Essential (primary) hypertension: Secondary | ICD-10-CM | POA: Diagnosis not present

## 2016-01-30 DIAGNOSIS — M5430 Sciatica, unspecified side: Secondary | ICD-10-CM | POA: Diagnosis not present

## 2016-01-30 DIAGNOSIS — K219 Gastro-esophageal reflux disease without esophagitis: Secondary | ICD-10-CM | POA: Diagnosis not present

## 2016-01-31 DIAGNOSIS — I10 Essential (primary) hypertension: Secondary | ICD-10-CM | POA: Diagnosis not present

## 2016-01-31 DIAGNOSIS — M5430 Sciatica, unspecified side: Secondary | ICD-10-CM | POA: Diagnosis not present

## 2016-01-31 DIAGNOSIS — K219 Gastro-esophageal reflux disease without esophagitis: Secondary | ICD-10-CM | POA: Diagnosis not present

## 2016-02-01 DIAGNOSIS — I10 Essential (primary) hypertension: Secondary | ICD-10-CM | POA: Diagnosis not present

## 2016-02-01 DIAGNOSIS — K219 Gastro-esophageal reflux disease without esophagitis: Secondary | ICD-10-CM | POA: Diagnosis not present

## 2016-02-01 DIAGNOSIS — M5430 Sciatica, unspecified side: Secondary | ICD-10-CM | POA: Diagnosis not present

## 2016-02-02 DIAGNOSIS — K219 Gastro-esophageal reflux disease without esophagitis: Secondary | ICD-10-CM | POA: Diagnosis not present

## 2016-02-02 DIAGNOSIS — M5430 Sciatica, unspecified side: Secondary | ICD-10-CM | POA: Diagnosis not present

## 2016-02-02 DIAGNOSIS — I10 Essential (primary) hypertension: Secondary | ICD-10-CM | POA: Diagnosis not present

## 2016-02-03 DIAGNOSIS — K219 Gastro-esophageal reflux disease without esophagitis: Secondary | ICD-10-CM | POA: Diagnosis not present

## 2016-02-03 DIAGNOSIS — I10 Essential (primary) hypertension: Secondary | ICD-10-CM | POA: Diagnosis not present

## 2016-02-03 DIAGNOSIS — M5430 Sciatica, unspecified side: Secondary | ICD-10-CM | POA: Diagnosis not present

## 2016-02-04 DIAGNOSIS — K219 Gastro-esophageal reflux disease without esophagitis: Secondary | ICD-10-CM | POA: Diagnosis not present

## 2016-02-04 DIAGNOSIS — I10 Essential (primary) hypertension: Secondary | ICD-10-CM | POA: Diagnosis not present

## 2016-02-04 DIAGNOSIS — M5430 Sciatica, unspecified side: Secondary | ICD-10-CM | POA: Diagnosis not present

## 2016-02-05 DIAGNOSIS — I10 Essential (primary) hypertension: Secondary | ICD-10-CM | POA: Diagnosis not present

## 2016-02-05 DIAGNOSIS — K219 Gastro-esophageal reflux disease without esophagitis: Secondary | ICD-10-CM | POA: Diagnosis not present

## 2016-02-05 DIAGNOSIS — M5430 Sciatica, unspecified side: Secondary | ICD-10-CM | POA: Diagnosis not present

## 2016-02-06 DIAGNOSIS — K219 Gastro-esophageal reflux disease without esophagitis: Secondary | ICD-10-CM | POA: Diagnosis not present

## 2016-02-06 DIAGNOSIS — M5430 Sciatica, unspecified side: Secondary | ICD-10-CM | POA: Diagnosis not present

## 2016-02-06 DIAGNOSIS — I10 Essential (primary) hypertension: Secondary | ICD-10-CM | POA: Diagnosis not present

## 2016-02-07 DIAGNOSIS — K219 Gastro-esophageal reflux disease without esophagitis: Secondary | ICD-10-CM | POA: Diagnosis not present

## 2016-02-07 DIAGNOSIS — M5430 Sciatica, unspecified side: Secondary | ICD-10-CM | POA: Diagnosis not present

## 2016-02-07 DIAGNOSIS — I10 Essential (primary) hypertension: Secondary | ICD-10-CM | POA: Diagnosis not present

## 2016-02-08 DIAGNOSIS — K219 Gastro-esophageal reflux disease without esophagitis: Secondary | ICD-10-CM | POA: Diagnosis not present

## 2016-02-08 DIAGNOSIS — M5430 Sciatica, unspecified side: Secondary | ICD-10-CM | POA: Diagnosis not present

## 2016-02-08 DIAGNOSIS — I10 Essential (primary) hypertension: Secondary | ICD-10-CM | POA: Diagnosis not present

## 2016-02-09 DIAGNOSIS — K219 Gastro-esophageal reflux disease without esophagitis: Secondary | ICD-10-CM | POA: Diagnosis not present

## 2016-02-09 DIAGNOSIS — I10 Essential (primary) hypertension: Secondary | ICD-10-CM | POA: Diagnosis not present

## 2016-02-09 DIAGNOSIS — M5430 Sciatica, unspecified side: Secondary | ICD-10-CM | POA: Diagnosis not present

## 2016-02-10 DIAGNOSIS — M5430 Sciatica, unspecified side: Secondary | ICD-10-CM | POA: Diagnosis not present

## 2016-02-10 DIAGNOSIS — I10 Essential (primary) hypertension: Secondary | ICD-10-CM | POA: Diagnosis not present

## 2016-02-10 DIAGNOSIS — K219 Gastro-esophageal reflux disease without esophagitis: Secondary | ICD-10-CM | POA: Diagnosis not present

## 2016-02-11 DIAGNOSIS — M5430 Sciatica, unspecified side: Secondary | ICD-10-CM | POA: Diagnosis not present

## 2016-02-11 DIAGNOSIS — I10 Essential (primary) hypertension: Secondary | ICD-10-CM | POA: Diagnosis not present

## 2016-02-11 DIAGNOSIS — K219 Gastro-esophageal reflux disease without esophagitis: Secondary | ICD-10-CM | POA: Diagnosis not present

## 2016-02-12 DIAGNOSIS — M5430 Sciatica, unspecified side: Secondary | ICD-10-CM | POA: Diagnosis not present

## 2016-02-12 DIAGNOSIS — K219 Gastro-esophageal reflux disease without esophagitis: Secondary | ICD-10-CM | POA: Diagnosis not present

## 2016-02-12 DIAGNOSIS — I10 Essential (primary) hypertension: Secondary | ICD-10-CM | POA: Diagnosis not present

## 2016-02-13 DIAGNOSIS — K219 Gastro-esophageal reflux disease without esophagitis: Secondary | ICD-10-CM | POA: Diagnosis not present

## 2016-02-13 DIAGNOSIS — M5430 Sciatica, unspecified side: Secondary | ICD-10-CM | POA: Diagnosis not present

## 2016-02-13 DIAGNOSIS — I10 Essential (primary) hypertension: Secondary | ICD-10-CM | POA: Diagnosis not present

## 2016-02-14 DIAGNOSIS — I10 Essential (primary) hypertension: Secondary | ICD-10-CM | POA: Diagnosis not present

## 2016-02-14 DIAGNOSIS — K219 Gastro-esophageal reflux disease without esophagitis: Secondary | ICD-10-CM | POA: Diagnosis not present

## 2016-02-14 DIAGNOSIS — M5430 Sciatica, unspecified side: Secondary | ICD-10-CM | POA: Diagnosis not present

## 2016-02-15 DIAGNOSIS — M5430 Sciatica, unspecified side: Secondary | ICD-10-CM | POA: Diagnosis not present

## 2016-02-15 DIAGNOSIS — I10 Essential (primary) hypertension: Secondary | ICD-10-CM | POA: Diagnosis not present

## 2016-02-15 DIAGNOSIS — K219 Gastro-esophageal reflux disease without esophagitis: Secondary | ICD-10-CM | POA: Diagnosis not present

## 2016-02-16 DIAGNOSIS — K219 Gastro-esophageal reflux disease without esophagitis: Secondary | ICD-10-CM | POA: Diagnosis not present

## 2016-02-16 DIAGNOSIS — I10 Essential (primary) hypertension: Secondary | ICD-10-CM | POA: Diagnosis not present

## 2016-02-16 DIAGNOSIS — M5430 Sciatica, unspecified side: Secondary | ICD-10-CM | POA: Diagnosis not present

## 2016-02-17 DIAGNOSIS — K219 Gastro-esophageal reflux disease without esophagitis: Secondary | ICD-10-CM | POA: Diagnosis not present

## 2016-02-17 DIAGNOSIS — I10 Essential (primary) hypertension: Secondary | ICD-10-CM | POA: Diagnosis not present

## 2016-02-17 DIAGNOSIS — M5430 Sciatica, unspecified side: Secondary | ICD-10-CM | POA: Diagnosis not present

## 2016-02-18 DIAGNOSIS — M5430 Sciatica, unspecified side: Secondary | ICD-10-CM | POA: Diagnosis not present

## 2016-02-18 DIAGNOSIS — K219 Gastro-esophageal reflux disease without esophagitis: Secondary | ICD-10-CM | POA: Diagnosis not present

## 2016-02-18 DIAGNOSIS — I10 Essential (primary) hypertension: Secondary | ICD-10-CM | POA: Diagnosis not present

## 2016-02-19 DIAGNOSIS — M5430 Sciatica, unspecified side: Secondary | ICD-10-CM | POA: Diagnosis not present

## 2016-02-19 DIAGNOSIS — K219 Gastro-esophageal reflux disease without esophagitis: Secondary | ICD-10-CM | POA: Diagnosis not present

## 2016-02-19 DIAGNOSIS — I10 Essential (primary) hypertension: Secondary | ICD-10-CM | POA: Diagnosis not present

## 2016-02-20 DIAGNOSIS — M5430 Sciatica, unspecified side: Secondary | ICD-10-CM | POA: Diagnosis not present

## 2016-02-20 DIAGNOSIS — I10 Essential (primary) hypertension: Secondary | ICD-10-CM | POA: Diagnosis not present

## 2016-02-20 DIAGNOSIS — K219 Gastro-esophageal reflux disease without esophagitis: Secondary | ICD-10-CM | POA: Diagnosis not present

## 2016-02-21 DIAGNOSIS — K219 Gastro-esophageal reflux disease without esophagitis: Secondary | ICD-10-CM | POA: Diagnosis not present

## 2016-02-21 DIAGNOSIS — I10 Essential (primary) hypertension: Secondary | ICD-10-CM | POA: Diagnosis not present

## 2016-02-21 DIAGNOSIS — M5430 Sciatica, unspecified side: Secondary | ICD-10-CM | POA: Diagnosis not present

## 2016-02-22 DIAGNOSIS — M5430 Sciatica, unspecified side: Secondary | ICD-10-CM | POA: Diagnosis not present

## 2016-02-22 DIAGNOSIS — I10 Essential (primary) hypertension: Secondary | ICD-10-CM | POA: Diagnosis not present

## 2016-02-22 DIAGNOSIS — K219 Gastro-esophageal reflux disease without esophagitis: Secondary | ICD-10-CM | POA: Diagnosis not present

## 2016-02-23 DIAGNOSIS — M5430 Sciatica, unspecified side: Secondary | ICD-10-CM | POA: Diagnosis not present

## 2016-02-23 DIAGNOSIS — K219 Gastro-esophageal reflux disease without esophagitis: Secondary | ICD-10-CM | POA: Diagnosis not present

## 2016-02-23 DIAGNOSIS — I10 Essential (primary) hypertension: Secondary | ICD-10-CM | POA: Diagnosis not present

## 2016-02-24 DIAGNOSIS — I10 Essential (primary) hypertension: Secondary | ICD-10-CM | POA: Diagnosis not present

## 2016-02-24 DIAGNOSIS — M5430 Sciatica, unspecified side: Secondary | ICD-10-CM | POA: Diagnosis not present

## 2016-02-24 DIAGNOSIS — K219 Gastro-esophageal reflux disease without esophagitis: Secondary | ICD-10-CM | POA: Diagnosis not present

## 2016-02-27 DIAGNOSIS — M5430 Sciatica, unspecified side: Secondary | ICD-10-CM | POA: Diagnosis not present

## 2016-02-27 DIAGNOSIS — I10 Essential (primary) hypertension: Secondary | ICD-10-CM | POA: Diagnosis not present

## 2016-02-27 DIAGNOSIS — K219 Gastro-esophageal reflux disease without esophagitis: Secondary | ICD-10-CM | POA: Diagnosis not present

## 2016-02-28 DIAGNOSIS — K219 Gastro-esophageal reflux disease without esophagitis: Secondary | ICD-10-CM | POA: Diagnosis not present

## 2016-02-28 DIAGNOSIS — M5430 Sciatica, unspecified side: Secondary | ICD-10-CM | POA: Diagnosis not present

## 2016-02-28 DIAGNOSIS — I10 Essential (primary) hypertension: Secondary | ICD-10-CM | POA: Diagnosis not present

## 2016-02-29 DIAGNOSIS — I10 Essential (primary) hypertension: Secondary | ICD-10-CM | POA: Diagnosis not present

## 2016-02-29 DIAGNOSIS — M5430 Sciatica, unspecified side: Secondary | ICD-10-CM | POA: Diagnosis not present

## 2016-02-29 DIAGNOSIS — K219 Gastro-esophageal reflux disease without esophagitis: Secondary | ICD-10-CM | POA: Diagnosis not present

## 2016-03-01 DIAGNOSIS — M5430 Sciatica, unspecified side: Secondary | ICD-10-CM | POA: Diagnosis not present

## 2016-03-01 DIAGNOSIS — I10 Essential (primary) hypertension: Secondary | ICD-10-CM | POA: Diagnosis not present

## 2016-03-01 DIAGNOSIS — K219 Gastro-esophageal reflux disease without esophagitis: Secondary | ICD-10-CM | POA: Diagnosis not present

## 2016-03-02 DIAGNOSIS — M5430 Sciatica, unspecified side: Secondary | ICD-10-CM | POA: Diagnosis not present

## 2016-03-02 DIAGNOSIS — I10 Essential (primary) hypertension: Secondary | ICD-10-CM | POA: Diagnosis not present

## 2016-03-02 DIAGNOSIS — K219 Gastro-esophageal reflux disease without esophagitis: Secondary | ICD-10-CM | POA: Diagnosis not present

## 2016-03-03 DIAGNOSIS — M5430 Sciatica, unspecified side: Secondary | ICD-10-CM | POA: Diagnosis not present

## 2016-03-03 DIAGNOSIS — I10 Essential (primary) hypertension: Secondary | ICD-10-CM | POA: Diagnosis not present

## 2016-03-03 DIAGNOSIS — K219 Gastro-esophageal reflux disease without esophagitis: Secondary | ICD-10-CM | POA: Diagnosis not present

## 2016-03-04 DIAGNOSIS — I10 Essential (primary) hypertension: Secondary | ICD-10-CM | POA: Diagnosis not present

## 2016-03-04 DIAGNOSIS — K219 Gastro-esophageal reflux disease without esophagitis: Secondary | ICD-10-CM | POA: Diagnosis not present

## 2016-03-04 DIAGNOSIS — M5430 Sciatica, unspecified side: Secondary | ICD-10-CM | POA: Diagnosis not present

## 2016-03-05 DIAGNOSIS — K219 Gastro-esophageal reflux disease without esophagitis: Secondary | ICD-10-CM | POA: Diagnosis not present

## 2016-03-05 DIAGNOSIS — I10 Essential (primary) hypertension: Secondary | ICD-10-CM | POA: Diagnosis not present

## 2016-03-05 DIAGNOSIS — M5430 Sciatica, unspecified side: Secondary | ICD-10-CM | POA: Diagnosis not present

## 2016-03-06 DIAGNOSIS — M5430 Sciatica, unspecified side: Secondary | ICD-10-CM | POA: Diagnosis not present

## 2016-03-06 DIAGNOSIS — I10 Essential (primary) hypertension: Secondary | ICD-10-CM | POA: Diagnosis not present

## 2016-03-06 DIAGNOSIS — K219 Gastro-esophageal reflux disease without esophagitis: Secondary | ICD-10-CM | POA: Diagnosis not present

## 2016-03-07 DIAGNOSIS — I10 Essential (primary) hypertension: Secondary | ICD-10-CM | POA: Diagnosis not present

## 2016-03-07 DIAGNOSIS — M5430 Sciatica, unspecified side: Secondary | ICD-10-CM | POA: Diagnosis not present

## 2016-03-07 DIAGNOSIS — K219 Gastro-esophageal reflux disease without esophagitis: Secondary | ICD-10-CM | POA: Diagnosis not present

## 2016-03-08 DIAGNOSIS — I10 Essential (primary) hypertension: Secondary | ICD-10-CM | POA: Diagnosis not present

## 2016-03-08 DIAGNOSIS — M5430 Sciatica, unspecified side: Secondary | ICD-10-CM | POA: Diagnosis not present

## 2016-03-08 DIAGNOSIS — K219 Gastro-esophageal reflux disease without esophagitis: Secondary | ICD-10-CM | POA: Diagnosis not present

## 2016-03-09 DIAGNOSIS — M5430 Sciatica, unspecified side: Secondary | ICD-10-CM | POA: Diagnosis not present

## 2016-03-09 DIAGNOSIS — K219 Gastro-esophageal reflux disease without esophagitis: Secondary | ICD-10-CM | POA: Diagnosis not present

## 2016-03-09 DIAGNOSIS — I10 Essential (primary) hypertension: Secondary | ICD-10-CM | POA: Diagnosis not present

## 2016-03-10 DIAGNOSIS — M5430 Sciatica, unspecified side: Secondary | ICD-10-CM | POA: Diagnosis not present

## 2016-03-10 DIAGNOSIS — K219 Gastro-esophageal reflux disease without esophagitis: Secondary | ICD-10-CM | POA: Diagnosis not present

## 2016-03-10 DIAGNOSIS — I10 Essential (primary) hypertension: Secondary | ICD-10-CM | POA: Diagnosis not present

## 2016-03-11 DIAGNOSIS — M5430 Sciatica, unspecified side: Secondary | ICD-10-CM | POA: Diagnosis not present

## 2016-03-11 DIAGNOSIS — K219 Gastro-esophageal reflux disease without esophagitis: Secondary | ICD-10-CM | POA: Diagnosis not present

## 2016-03-11 DIAGNOSIS — I10 Essential (primary) hypertension: Secondary | ICD-10-CM | POA: Diagnosis not present

## 2016-03-12 DIAGNOSIS — M5430 Sciatica, unspecified side: Secondary | ICD-10-CM | POA: Diagnosis not present

## 2016-03-12 DIAGNOSIS — K219 Gastro-esophageal reflux disease without esophagitis: Secondary | ICD-10-CM | POA: Diagnosis not present

## 2016-03-12 DIAGNOSIS — I10 Essential (primary) hypertension: Secondary | ICD-10-CM | POA: Diagnosis not present

## 2016-03-13 DIAGNOSIS — M5430 Sciatica, unspecified side: Secondary | ICD-10-CM | POA: Diagnosis not present

## 2016-03-13 DIAGNOSIS — K219 Gastro-esophageal reflux disease without esophagitis: Secondary | ICD-10-CM | POA: Diagnosis not present

## 2016-03-13 DIAGNOSIS — I10 Essential (primary) hypertension: Secondary | ICD-10-CM | POA: Diagnosis not present

## 2016-03-14 DIAGNOSIS — M5430 Sciatica, unspecified side: Secondary | ICD-10-CM | POA: Diagnosis not present

## 2016-03-14 DIAGNOSIS — K219 Gastro-esophageal reflux disease without esophagitis: Secondary | ICD-10-CM | POA: Diagnosis not present

## 2016-03-14 DIAGNOSIS — I10 Essential (primary) hypertension: Secondary | ICD-10-CM | POA: Diagnosis not present

## 2016-03-15 DIAGNOSIS — I10 Essential (primary) hypertension: Secondary | ICD-10-CM | POA: Diagnosis not present

## 2016-03-15 DIAGNOSIS — K219 Gastro-esophageal reflux disease without esophagitis: Secondary | ICD-10-CM | POA: Diagnosis not present

## 2016-03-15 DIAGNOSIS — M5430 Sciatica, unspecified side: Secondary | ICD-10-CM | POA: Diagnosis not present

## 2016-03-16 DIAGNOSIS — M5430 Sciatica, unspecified side: Secondary | ICD-10-CM | POA: Diagnosis not present

## 2016-03-16 DIAGNOSIS — K219 Gastro-esophageal reflux disease without esophagitis: Secondary | ICD-10-CM | POA: Diagnosis not present

## 2016-03-16 DIAGNOSIS — I10 Essential (primary) hypertension: Secondary | ICD-10-CM | POA: Diagnosis not present

## 2016-03-17 DIAGNOSIS — M5430 Sciatica, unspecified side: Secondary | ICD-10-CM | POA: Diagnosis not present

## 2016-03-17 DIAGNOSIS — I10 Essential (primary) hypertension: Secondary | ICD-10-CM | POA: Diagnosis not present

## 2016-03-17 DIAGNOSIS — K219 Gastro-esophageal reflux disease without esophagitis: Secondary | ICD-10-CM | POA: Diagnosis not present

## 2016-03-18 DIAGNOSIS — K219 Gastro-esophageal reflux disease without esophagitis: Secondary | ICD-10-CM | POA: Diagnosis not present

## 2016-03-18 DIAGNOSIS — I10 Essential (primary) hypertension: Secondary | ICD-10-CM | POA: Diagnosis not present

## 2016-03-18 DIAGNOSIS — M5430 Sciatica, unspecified side: Secondary | ICD-10-CM | POA: Diagnosis not present

## 2016-03-19 DIAGNOSIS — K219 Gastro-esophageal reflux disease without esophagitis: Secondary | ICD-10-CM | POA: Diagnosis not present

## 2016-03-19 DIAGNOSIS — M5430 Sciatica, unspecified side: Secondary | ICD-10-CM | POA: Diagnosis not present

## 2016-03-19 DIAGNOSIS — I10 Essential (primary) hypertension: Secondary | ICD-10-CM | POA: Diagnosis not present

## 2016-03-20 DIAGNOSIS — M5430 Sciatica, unspecified side: Secondary | ICD-10-CM | POA: Diagnosis not present

## 2016-03-20 DIAGNOSIS — I10 Essential (primary) hypertension: Secondary | ICD-10-CM | POA: Diagnosis not present

## 2016-03-20 DIAGNOSIS — K219 Gastro-esophageal reflux disease without esophagitis: Secondary | ICD-10-CM | POA: Diagnosis not present

## 2016-03-21 DIAGNOSIS — M5430 Sciatica, unspecified side: Secondary | ICD-10-CM | POA: Diagnosis not present

## 2016-03-21 DIAGNOSIS — K219 Gastro-esophageal reflux disease without esophagitis: Secondary | ICD-10-CM | POA: Diagnosis not present

## 2016-03-21 DIAGNOSIS — I10 Essential (primary) hypertension: Secondary | ICD-10-CM | POA: Diagnosis not present

## 2016-03-22 DIAGNOSIS — M5430 Sciatica, unspecified side: Secondary | ICD-10-CM | POA: Diagnosis not present

## 2016-03-22 DIAGNOSIS — K219 Gastro-esophageal reflux disease without esophagitis: Secondary | ICD-10-CM | POA: Diagnosis not present

## 2016-03-22 DIAGNOSIS — I10 Essential (primary) hypertension: Secondary | ICD-10-CM | POA: Diagnosis not present

## 2016-03-23 DIAGNOSIS — I10 Essential (primary) hypertension: Secondary | ICD-10-CM | POA: Diagnosis not present

## 2016-03-23 DIAGNOSIS — M5430 Sciatica, unspecified side: Secondary | ICD-10-CM | POA: Diagnosis not present

## 2016-03-23 DIAGNOSIS — K219 Gastro-esophageal reflux disease without esophagitis: Secondary | ICD-10-CM | POA: Diagnosis not present

## 2016-03-24 DIAGNOSIS — I10 Essential (primary) hypertension: Secondary | ICD-10-CM | POA: Diagnosis not present

## 2016-03-24 DIAGNOSIS — M5430 Sciatica, unspecified side: Secondary | ICD-10-CM | POA: Diagnosis not present

## 2016-03-24 DIAGNOSIS — K219 Gastro-esophageal reflux disease without esophagitis: Secondary | ICD-10-CM | POA: Diagnosis not present

## 2016-03-26 DIAGNOSIS — K219 Gastro-esophageal reflux disease without esophagitis: Secondary | ICD-10-CM | POA: Diagnosis not present

## 2016-03-26 DIAGNOSIS — I10 Essential (primary) hypertension: Secondary | ICD-10-CM | POA: Diagnosis not present

## 2016-03-26 DIAGNOSIS — M5430 Sciatica, unspecified side: Secondary | ICD-10-CM | POA: Diagnosis not present

## 2016-03-27 DIAGNOSIS — M5430 Sciatica, unspecified side: Secondary | ICD-10-CM | POA: Diagnosis not present

## 2016-03-27 DIAGNOSIS — K219 Gastro-esophageal reflux disease without esophagitis: Secondary | ICD-10-CM | POA: Diagnosis not present

## 2016-03-27 DIAGNOSIS — I10 Essential (primary) hypertension: Secondary | ICD-10-CM | POA: Diagnosis not present

## 2016-03-28 DIAGNOSIS — K219 Gastro-esophageal reflux disease without esophagitis: Secondary | ICD-10-CM | POA: Diagnosis not present

## 2016-03-28 DIAGNOSIS — M5430 Sciatica, unspecified side: Secondary | ICD-10-CM | POA: Diagnosis not present

## 2016-03-28 DIAGNOSIS — I10 Essential (primary) hypertension: Secondary | ICD-10-CM | POA: Diagnosis not present

## 2016-03-29 DIAGNOSIS — K219 Gastro-esophageal reflux disease without esophagitis: Secondary | ICD-10-CM | POA: Diagnosis not present

## 2016-03-29 DIAGNOSIS — M5430 Sciatica, unspecified side: Secondary | ICD-10-CM | POA: Diagnosis not present

## 2016-03-29 DIAGNOSIS — I10 Essential (primary) hypertension: Secondary | ICD-10-CM | POA: Diagnosis not present

## 2016-03-30 DIAGNOSIS — I10 Essential (primary) hypertension: Secondary | ICD-10-CM | POA: Diagnosis not present

## 2016-03-30 DIAGNOSIS — M5430 Sciatica, unspecified side: Secondary | ICD-10-CM | POA: Diagnosis not present

## 2016-03-30 DIAGNOSIS — K219 Gastro-esophageal reflux disease without esophagitis: Secondary | ICD-10-CM | POA: Diagnosis not present

## 2016-03-31 DIAGNOSIS — M5430 Sciatica, unspecified side: Secondary | ICD-10-CM | POA: Diagnosis not present

## 2016-03-31 DIAGNOSIS — I10 Essential (primary) hypertension: Secondary | ICD-10-CM | POA: Diagnosis not present

## 2016-03-31 DIAGNOSIS — K219 Gastro-esophageal reflux disease without esophagitis: Secondary | ICD-10-CM | POA: Diagnosis not present

## 2016-04-01 DIAGNOSIS — M5430 Sciatica, unspecified side: Secondary | ICD-10-CM | POA: Diagnosis not present

## 2016-04-01 DIAGNOSIS — I10 Essential (primary) hypertension: Secondary | ICD-10-CM | POA: Diagnosis not present

## 2016-04-01 DIAGNOSIS — K219 Gastro-esophageal reflux disease without esophagitis: Secondary | ICD-10-CM | POA: Diagnosis not present

## 2016-04-02 DIAGNOSIS — M5430 Sciatica, unspecified side: Secondary | ICD-10-CM | POA: Diagnosis not present

## 2016-04-02 DIAGNOSIS — K219 Gastro-esophageal reflux disease without esophagitis: Secondary | ICD-10-CM | POA: Diagnosis not present

## 2016-04-02 DIAGNOSIS — I10 Essential (primary) hypertension: Secondary | ICD-10-CM | POA: Diagnosis not present

## 2016-04-03 DIAGNOSIS — I10 Essential (primary) hypertension: Secondary | ICD-10-CM | POA: Diagnosis not present

## 2016-04-03 DIAGNOSIS — K219 Gastro-esophageal reflux disease without esophagitis: Secondary | ICD-10-CM | POA: Diagnosis not present

## 2016-04-03 DIAGNOSIS — M5430 Sciatica, unspecified side: Secondary | ICD-10-CM | POA: Diagnosis not present

## 2016-04-04 DIAGNOSIS — I10 Essential (primary) hypertension: Secondary | ICD-10-CM | POA: Diagnosis not present

## 2016-04-04 DIAGNOSIS — M5430 Sciatica, unspecified side: Secondary | ICD-10-CM | POA: Diagnosis not present

## 2016-04-04 DIAGNOSIS — K219 Gastro-esophageal reflux disease without esophagitis: Secondary | ICD-10-CM | POA: Diagnosis not present

## 2016-04-05 DIAGNOSIS — M5430 Sciatica, unspecified side: Secondary | ICD-10-CM | POA: Diagnosis not present

## 2016-04-05 DIAGNOSIS — K219 Gastro-esophageal reflux disease without esophagitis: Secondary | ICD-10-CM | POA: Diagnosis not present

## 2016-04-05 DIAGNOSIS — I10 Essential (primary) hypertension: Secondary | ICD-10-CM | POA: Diagnosis not present

## 2016-04-06 DIAGNOSIS — K219 Gastro-esophageal reflux disease without esophagitis: Secondary | ICD-10-CM | POA: Diagnosis not present

## 2016-04-06 DIAGNOSIS — I10 Essential (primary) hypertension: Secondary | ICD-10-CM | POA: Diagnosis not present

## 2016-04-06 DIAGNOSIS — M5430 Sciatica, unspecified side: Secondary | ICD-10-CM | POA: Diagnosis not present

## 2016-04-07 DIAGNOSIS — K219 Gastro-esophageal reflux disease without esophagitis: Secondary | ICD-10-CM | POA: Diagnosis not present

## 2016-04-07 DIAGNOSIS — M5430 Sciatica, unspecified side: Secondary | ICD-10-CM | POA: Diagnosis not present

## 2016-04-07 DIAGNOSIS — I10 Essential (primary) hypertension: Secondary | ICD-10-CM | POA: Diagnosis not present

## 2016-04-08 DIAGNOSIS — M5430 Sciatica, unspecified side: Secondary | ICD-10-CM | POA: Diagnosis not present

## 2016-04-08 DIAGNOSIS — K219 Gastro-esophageal reflux disease without esophagitis: Secondary | ICD-10-CM | POA: Diagnosis not present

## 2016-04-08 DIAGNOSIS — I10 Essential (primary) hypertension: Secondary | ICD-10-CM | POA: Diagnosis not present

## 2016-04-09 DIAGNOSIS — I10 Essential (primary) hypertension: Secondary | ICD-10-CM | POA: Diagnosis not present

## 2016-04-09 DIAGNOSIS — M5430 Sciatica, unspecified side: Secondary | ICD-10-CM | POA: Diagnosis not present

## 2016-04-09 DIAGNOSIS — K219 Gastro-esophageal reflux disease without esophagitis: Secondary | ICD-10-CM | POA: Diagnosis not present

## 2016-04-10 DIAGNOSIS — I10 Essential (primary) hypertension: Secondary | ICD-10-CM | POA: Diagnosis not present

## 2016-04-10 DIAGNOSIS — M5430 Sciatica, unspecified side: Secondary | ICD-10-CM | POA: Diagnosis not present

## 2016-04-10 DIAGNOSIS — K219 Gastro-esophageal reflux disease without esophagitis: Secondary | ICD-10-CM | POA: Diagnosis not present

## 2016-04-11 DIAGNOSIS — M5430 Sciatica, unspecified side: Secondary | ICD-10-CM | POA: Diagnosis not present

## 2016-04-11 DIAGNOSIS — I10 Essential (primary) hypertension: Secondary | ICD-10-CM | POA: Diagnosis not present

## 2016-04-11 DIAGNOSIS — K219 Gastro-esophageal reflux disease without esophagitis: Secondary | ICD-10-CM | POA: Diagnosis not present

## 2016-04-12 DIAGNOSIS — I10 Essential (primary) hypertension: Secondary | ICD-10-CM | POA: Diagnosis not present

## 2016-04-12 DIAGNOSIS — M5430 Sciatica, unspecified side: Secondary | ICD-10-CM | POA: Diagnosis not present

## 2016-04-12 DIAGNOSIS — K219 Gastro-esophageal reflux disease without esophagitis: Secondary | ICD-10-CM | POA: Diagnosis not present

## 2016-04-13 DIAGNOSIS — I10 Essential (primary) hypertension: Secondary | ICD-10-CM | POA: Diagnosis not present

## 2016-04-13 DIAGNOSIS — K219 Gastro-esophageal reflux disease without esophagitis: Secondary | ICD-10-CM | POA: Diagnosis not present

## 2016-04-13 DIAGNOSIS — M5430 Sciatica, unspecified side: Secondary | ICD-10-CM | POA: Diagnosis not present

## 2016-04-14 DIAGNOSIS — I10 Essential (primary) hypertension: Secondary | ICD-10-CM | POA: Diagnosis not present

## 2016-04-14 DIAGNOSIS — M5430 Sciatica, unspecified side: Secondary | ICD-10-CM | POA: Diagnosis not present

## 2016-04-14 DIAGNOSIS — K219 Gastro-esophageal reflux disease without esophagitis: Secondary | ICD-10-CM | POA: Diagnosis not present

## 2016-04-15 DIAGNOSIS — I10 Essential (primary) hypertension: Secondary | ICD-10-CM | POA: Diagnosis not present

## 2016-04-15 DIAGNOSIS — K219 Gastro-esophageal reflux disease without esophagitis: Secondary | ICD-10-CM | POA: Diagnosis not present

## 2016-04-15 DIAGNOSIS — M5430 Sciatica, unspecified side: Secondary | ICD-10-CM | POA: Diagnosis not present

## 2016-04-16 DIAGNOSIS — M5430 Sciatica, unspecified side: Secondary | ICD-10-CM | POA: Diagnosis not present

## 2016-04-16 DIAGNOSIS — K219 Gastro-esophageal reflux disease without esophagitis: Secondary | ICD-10-CM | POA: Diagnosis not present

## 2016-04-16 DIAGNOSIS — I10 Essential (primary) hypertension: Secondary | ICD-10-CM | POA: Diagnosis not present

## 2016-04-17 DIAGNOSIS — K219 Gastro-esophageal reflux disease without esophagitis: Secondary | ICD-10-CM | POA: Diagnosis not present

## 2016-04-17 DIAGNOSIS — M5430 Sciatica, unspecified side: Secondary | ICD-10-CM | POA: Diagnosis not present

## 2016-04-17 DIAGNOSIS — I10 Essential (primary) hypertension: Secondary | ICD-10-CM | POA: Diagnosis not present

## 2016-04-18 DIAGNOSIS — M5430 Sciatica, unspecified side: Secondary | ICD-10-CM | POA: Diagnosis not present

## 2016-04-18 DIAGNOSIS — I10 Essential (primary) hypertension: Secondary | ICD-10-CM | POA: Diagnosis not present

## 2016-04-18 DIAGNOSIS — K219 Gastro-esophageal reflux disease without esophagitis: Secondary | ICD-10-CM | POA: Diagnosis not present

## 2016-04-19 DIAGNOSIS — I10 Essential (primary) hypertension: Secondary | ICD-10-CM | POA: Diagnosis not present

## 2016-04-19 DIAGNOSIS — K219 Gastro-esophageal reflux disease without esophagitis: Secondary | ICD-10-CM | POA: Diagnosis not present

## 2016-04-19 DIAGNOSIS — M5430 Sciatica, unspecified side: Secondary | ICD-10-CM | POA: Diagnosis not present

## 2016-04-20 DIAGNOSIS — M5430 Sciatica, unspecified side: Secondary | ICD-10-CM | POA: Diagnosis not present

## 2016-04-20 DIAGNOSIS — I10 Essential (primary) hypertension: Secondary | ICD-10-CM | POA: Diagnosis not present

## 2016-04-20 DIAGNOSIS — K219 Gastro-esophageal reflux disease without esophagitis: Secondary | ICD-10-CM | POA: Diagnosis not present

## 2016-04-21 DIAGNOSIS — M5430 Sciatica, unspecified side: Secondary | ICD-10-CM | POA: Diagnosis not present

## 2016-04-21 DIAGNOSIS — I10 Essential (primary) hypertension: Secondary | ICD-10-CM | POA: Diagnosis not present

## 2016-04-21 DIAGNOSIS — K219 Gastro-esophageal reflux disease without esophagitis: Secondary | ICD-10-CM | POA: Diagnosis not present

## 2016-04-22 DIAGNOSIS — M5430 Sciatica, unspecified side: Secondary | ICD-10-CM | POA: Diagnosis not present

## 2016-04-22 DIAGNOSIS — I10 Essential (primary) hypertension: Secondary | ICD-10-CM | POA: Diagnosis not present

## 2016-04-22 DIAGNOSIS — K219 Gastro-esophageal reflux disease without esophagitis: Secondary | ICD-10-CM | POA: Diagnosis not present

## 2016-04-23 DIAGNOSIS — K219 Gastro-esophageal reflux disease without esophagitis: Secondary | ICD-10-CM | POA: Diagnosis not present

## 2016-04-23 DIAGNOSIS — M5430 Sciatica, unspecified side: Secondary | ICD-10-CM | POA: Diagnosis not present

## 2016-04-23 DIAGNOSIS — I10 Essential (primary) hypertension: Secondary | ICD-10-CM | POA: Diagnosis not present

## 2016-04-24 DIAGNOSIS — K219 Gastro-esophageal reflux disease without esophagitis: Secondary | ICD-10-CM | POA: Diagnosis not present

## 2016-04-24 DIAGNOSIS — I10 Essential (primary) hypertension: Secondary | ICD-10-CM | POA: Diagnosis not present

## 2016-04-24 DIAGNOSIS — M5430 Sciatica, unspecified side: Secondary | ICD-10-CM | POA: Diagnosis not present

## 2016-04-25 DIAGNOSIS — K219 Gastro-esophageal reflux disease without esophagitis: Secondary | ICD-10-CM | POA: Diagnosis not present

## 2016-04-25 DIAGNOSIS — M5430 Sciatica, unspecified side: Secondary | ICD-10-CM | POA: Diagnosis not present

## 2016-04-25 DIAGNOSIS — I10 Essential (primary) hypertension: Secondary | ICD-10-CM | POA: Diagnosis not present

## 2016-04-26 DIAGNOSIS — I10 Essential (primary) hypertension: Secondary | ICD-10-CM | POA: Diagnosis not present

## 2016-04-26 DIAGNOSIS — K219 Gastro-esophageal reflux disease without esophagitis: Secondary | ICD-10-CM | POA: Diagnosis not present

## 2016-04-26 DIAGNOSIS — M5430 Sciatica, unspecified side: Secondary | ICD-10-CM | POA: Diagnosis not present

## 2016-04-27 DIAGNOSIS — K219 Gastro-esophageal reflux disease without esophagitis: Secondary | ICD-10-CM | POA: Diagnosis not present

## 2016-04-27 DIAGNOSIS — M5430 Sciatica, unspecified side: Secondary | ICD-10-CM | POA: Diagnosis not present

## 2016-04-27 DIAGNOSIS — I10 Essential (primary) hypertension: Secondary | ICD-10-CM | POA: Diagnosis not present

## 2016-04-28 DIAGNOSIS — K219 Gastro-esophageal reflux disease without esophagitis: Secondary | ICD-10-CM | POA: Diagnosis not present

## 2016-04-28 DIAGNOSIS — I10 Essential (primary) hypertension: Secondary | ICD-10-CM | POA: Diagnosis not present

## 2016-04-28 DIAGNOSIS — M5430 Sciatica, unspecified side: Secondary | ICD-10-CM | POA: Diagnosis not present

## 2016-04-29 DIAGNOSIS — I10 Essential (primary) hypertension: Secondary | ICD-10-CM | POA: Diagnosis not present

## 2016-04-29 DIAGNOSIS — K219 Gastro-esophageal reflux disease without esophagitis: Secondary | ICD-10-CM | POA: Diagnosis not present

## 2016-04-29 DIAGNOSIS — M5430 Sciatica, unspecified side: Secondary | ICD-10-CM | POA: Diagnosis not present

## 2016-04-30 DIAGNOSIS — I10 Essential (primary) hypertension: Secondary | ICD-10-CM | POA: Diagnosis not present

## 2016-04-30 DIAGNOSIS — K219 Gastro-esophageal reflux disease without esophagitis: Secondary | ICD-10-CM | POA: Diagnosis not present

## 2016-04-30 DIAGNOSIS — M5430 Sciatica, unspecified side: Secondary | ICD-10-CM | POA: Diagnosis not present

## 2016-05-01 DIAGNOSIS — I10 Essential (primary) hypertension: Secondary | ICD-10-CM | POA: Diagnosis not present

## 2016-05-01 DIAGNOSIS — K219 Gastro-esophageal reflux disease without esophagitis: Secondary | ICD-10-CM | POA: Diagnosis not present

## 2016-05-01 DIAGNOSIS — M5430 Sciatica, unspecified side: Secondary | ICD-10-CM | POA: Diagnosis not present

## 2016-05-02 DIAGNOSIS — M5430 Sciatica, unspecified side: Secondary | ICD-10-CM | POA: Diagnosis not present

## 2016-05-02 DIAGNOSIS — I10 Essential (primary) hypertension: Secondary | ICD-10-CM | POA: Diagnosis not present

## 2016-05-02 DIAGNOSIS — K219 Gastro-esophageal reflux disease without esophagitis: Secondary | ICD-10-CM | POA: Diagnosis not present

## 2016-05-03 DIAGNOSIS — I10 Essential (primary) hypertension: Secondary | ICD-10-CM | POA: Diagnosis not present

## 2016-05-03 DIAGNOSIS — M5430 Sciatica, unspecified side: Secondary | ICD-10-CM | POA: Diagnosis not present

## 2016-05-03 DIAGNOSIS — K219 Gastro-esophageal reflux disease without esophagitis: Secondary | ICD-10-CM | POA: Diagnosis not present

## 2016-05-04 DIAGNOSIS — I10 Essential (primary) hypertension: Secondary | ICD-10-CM | POA: Diagnosis not present

## 2016-05-04 DIAGNOSIS — M5430 Sciatica, unspecified side: Secondary | ICD-10-CM | POA: Diagnosis not present

## 2016-05-04 DIAGNOSIS — K219 Gastro-esophageal reflux disease without esophagitis: Secondary | ICD-10-CM | POA: Diagnosis not present

## 2016-05-05 DIAGNOSIS — M5430 Sciatica, unspecified side: Secondary | ICD-10-CM | POA: Diagnosis not present

## 2016-05-05 DIAGNOSIS — I10 Essential (primary) hypertension: Secondary | ICD-10-CM | POA: Diagnosis not present

## 2016-05-05 DIAGNOSIS — K219 Gastro-esophageal reflux disease without esophagitis: Secondary | ICD-10-CM | POA: Diagnosis not present

## 2016-05-06 DIAGNOSIS — I10 Essential (primary) hypertension: Secondary | ICD-10-CM | POA: Diagnosis not present

## 2016-05-06 DIAGNOSIS — M5430 Sciatica, unspecified side: Secondary | ICD-10-CM | POA: Diagnosis not present

## 2016-05-06 DIAGNOSIS — K219 Gastro-esophageal reflux disease without esophagitis: Secondary | ICD-10-CM | POA: Diagnosis not present

## 2016-05-07 DIAGNOSIS — I10 Essential (primary) hypertension: Secondary | ICD-10-CM | POA: Diagnosis not present

## 2016-05-07 DIAGNOSIS — M5430 Sciatica, unspecified side: Secondary | ICD-10-CM | POA: Diagnosis not present

## 2016-05-07 DIAGNOSIS — K219 Gastro-esophageal reflux disease without esophagitis: Secondary | ICD-10-CM | POA: Diagnosis not present

## 2016-05-08 DIAGNOSIS — K219 Gastro-esophageal reflux disease without esophagitis: Secondary | ICD-10-CM | POA: Diagnosis not present

## 2016-05-08 DIAGNOSIS — M5430 Sciatica, unspecified side: Secondary | ICD-10-CM | POA: Diagnosis not present

## 2016-05-08 DIAGNOSIS — I10 Essential (primary) hypertension: Secondary | ICD-10-CM | POA: Diagnosis not present

## 2016-05-09 DIAGNOSIS — K219 Gastro-esophageal reflux disease without esophagitis: Secondary | ICD-10-CM | POA: Diagnosis not present

## 2016-05-09 DIAGNOSIS — I10 Essential (primary) hypertension: Secondary | ICD-10-CM | POA: Diagnosis not present

## 2016-05-09 DIAGNOSIS — M5430 Sciatica, unspecified side: Secondary | ICD-10-CM | POA: Diagnosis not present

## 2016-05-10 DIAGNOSIS — M5430 Sciatica, unspecified side: Secondary | ICD-10-CM | POA: Diagnosis not present

## 2016-05-10 DIAGNOSIS — I10 Essential (primary) hypertension: Secondary | ICD-10-CM | POA: Diagnosis not present

## 2016-05-10 DIAGNOSIS — K219 Gastro-esophageal reflux disease without esophagitis: Secondary | ICD-10-CM | POA: Diagnosis not present

## 2016-05-11 DIAGNOSIS — I10 Essential (primary) hypertension: Secondary | ICD-10-CM | POA: Diagnosis not present

## 2016-05-11 DIAGNOSIS — K219 Gastro-esophageal reflux disease without esophagitis: Secondary | ICD-10-CM | POA: Diagnosis not present

## 2016-05-11 DIAGNOSIS — M5430 Sciatica, unspecified side: Secondary | ICD-10-CM | POA: Diagnosis not present

## 2016-05-12 DIAGNOSIS — I10 Essential (primary) hypertension: Secondary | ICD-10-CM | POA: Diagnosis not present

## 2016-05-12 DIAGNOSIS — M5430 Sciatica, unspecified side: Secondary | ICD-10-CM | POA: Diagnosis not present

## 2016-05-12 DIAGNOSIS — K219 Gastro-esophageal reflux disease without esophagitis: Secondary | ICD-10-CM | POA: Diagnosis not present

## 2016-05-13 DIAGNOSIS — I10 Essential (primary) hypertension: Secondary | ICD-10-CM | POA: Diagnosis not present

## 2016-05-13 DIAGNOSIS — K219 Gastro-esophageal reflux disease without esophagitis: Secondary | ICD-10-CM | POA: Diagnosis not present

## 2016-05-13 DIAGNOSIS — M5430 Sciatica, unspecified side: Secondary | ICD-10-CM | POA: Diagnosis not present

## 2016-05-14 DIAGNOSIS — M5430 Sciatica, unspecified side: Secondary | ICD-10-CM | POA: Diagnosis not present

## 2016-05-14 DIAGNOSIS — K219 Gastro-esophageal reflux disease without esophagitis: Secondary | ICD-10-CM | POA: Diagnosis not present

## 2016-05-14 DIAGNOSIS — I10 Essential (primary) hypertension: Secondary | ICD-10-CM | POA: Diagnosis not present

## 2016-05-15 DIAGNOSIS — K219 Gastro-esophageal reflux disease without esophagitis: Secondary | ICD-10-CM | POA: Diagnosis not present

## 2016-05-15 DIAGNOSIS — I10 Essential (primary) hypertension: Secondary | ICD-10-CM | POA: Diagnosis not present

## 2016-05-15 DIAGNOSIS — M5430 Sciatica, unspecified side: Secondary | ICD-10-CM | POA: Diagnosis not present

## 2016-05-16 DIAGNOSIS — K219 Gastro-esophageal reflux disease without esophagitis: Secondary | ICD-10-CM | POA: Diagnosis not present

## 2016-05-16 DIAGNOSIS — I10 Essential (primary) hypertension: Secondary | ICD-10-CM | POA: Diagnosis not present

## 2016-05-16 DIAGNOSIS — M5430 Sciatica, unspecified side: Secondary | ICD-10-CM | POA: Diagnosis not present

## 2016-05-17 DIAGNOSIS — M5430 Sciatica, unspecified side: Secondary | ICD-10-CM | POA: Diagnosis not present

## 2016-05-17 DIAGNOSIS — K219 Gastro-esophageal reflux disease without esophagitis: Secondary | ICD-10-CM | POA: Diagnosis not present

## 2016-05-17 DIAGNOSIS — I10 Essential (primary) hypertension: Secondary | ICD-10-CM | POA: Diagnosis not present

## 2016-05-18 DIAGNOSIS — K219 Gastro-esophageal reflux disease without esophagitis: Secondary | ICD-10-CM | POA: Diagnosis not present

## 2016-05-18 DIAGNOSIS — M5430 Sciatica, unspecified side: Secondary | ICD-10-CM | POA: Diagnosis not present

## 2016-05-18 DIAGNOSIS — I10 Essential (primary) hypertension: Secondary | ICD-10-CM | POA: Diagnosis not present

## 2016-05-19 DIAGNOSIS — K219 Gastro-esophageal reflux disease without esophagitis: Secondary | ICD-10-CM | POA: Diagnosis not present

## 2016-05-19 DIAGNOSIS — I10 Essential (primary) hypertension: Secondary | ICD-10-CM | POA: Diagnosis not present

## 2016-05-19 DIAGNOSIS — M5430 Sciatica, unspecified side: Secondary | ICD-10-CM | POA: Diagnosis not present

## 2016-05-20 DIAGNOSIS — M5430 Sciatica, unspecified side: Secondary | ICD-10-CM | POA: Diagnosis not present

## 2016-05-20 DIAGNOSIS — I10 Essential (primary) hypertension: Secondary | ICD-10-CM | POA: Diagnosis not present

## 2016-05-20 DIAGNOSIS — K219 Gastro-esophageal reflux disease without esophagitis: Secondary | ICD-10-CM | POA: Diagnosis not present

## 2016-05-21 DIAGNOSIS — I10 Essential (primary) hypertension: Secondary | ICD-10-CM | POA: Diagnosis not present

## 2016-05-21 DIAGNOSIS — M5430 Sciatica, unspecified side: Secondary | ICD-10-CM | POA: Diagnosis not present

## 2016-05-21 DIAGNOSIS — K219 Gastro-esophageal reflux disease without esophagitis: Secondary | ICD-10-CM | POA: Diagnosis not present

## 2016-05-22 DIAGNOSIS — I10 Essential (primary) hypertension: Secondary | ICD-10-CM | POA: Diagnosis not present

## 2016-05-22 DIAGNOSIS — M5430 Sciatica, unspecified side: Secondary | ICD-10-CM | POA: Diagnosis not present

## 2016-05-22 DIAGNOSIS — K219 Gastro-esophageal reflux disease without esophagitis: Secondary | ICD-10-CM | POA: Diagnosis not present

## 2016-05-23 DIAGNOSIS — M5430 Sciatica, unspecified side: Secondary | ICD-10-CM | POA: Diagnosis not present

## 2016-05-23 DIAGNOSIS — I10 Essential (primary) hypertension: Secondary | ICD-10-CM | POA: Diagnosis not present

## 2016-05-23 DIAGNOSIS — K219 Gastro-esophageal reflux disease without esophagitis: Secondary | ICD-10-CM | POA: Diagnosis not present

## 2016-05-24 DIAGNOSIS — K219 Gastro-esophageal reflux disease without esophagitis: Secondary | ICD-10-CM | POA: Diagnosis not present

## 2016-05-24 DIAGNOSIS — M5430 Sciatica, unspecified side: Secondary | ICD-10-CM | POA: Diagnosis not present

## 2016-05-24 DIAGNOSIS — I10 Essential (primary) hypertension: Secondary | ICD-10-CM | POA: Diagnosis not present

## 2016-05-25 DIAGNOSIS — M5430 Sciatica, unspecified side: Secondary | ICD-10-CM | POA: Diagnosis not present

## 2016-05-25 DIAGNOSIS — I10 Essential (primary) hypertension: Secondary | ICD-10-CM | POA: Diagnosis not present

## 2016-05-25 DIAGNOSIS — K219 Gastro-esophageal reflux disease without esophagitis: Secondary | ICD-10-CM | POA: Diagnosis not present

## 2016-05-27 DIAGNOSIS — M5430 Sciatica, unspecified side: Secondary | ICD-10-CM | POA: Diagnosis not present

## 2016-05-27 DIAGNOSIS — I10 Essential (primary) hypertension: Secondary | ICD-10-CM | POA: Diagnosis not present

## 2016-05-27 DIAGNOSIS — K219 Gastro-esophageal reflux disease without esophagitis: Secondary | ICD-10-CM | POA: Diagnosis not present

## 2016-05-28 DIAGNOSIS — I10 Essential (primary) hypertension: Secondary | ICD-10-CM | POA: Diagnosis not present

## 2016-05-28 DIAGNOSIS — K219 Gastro-esophageal reflux disease without esophagitis: Secondary | ICD-10-CM | POA: Diagnosis not present

## 2016-05-28 DIAGNOSIS — M5430 Sciatica, unspecified side: Secondary | ICD-10-CM | POA: Diagnosis not present

## 2016-05-29 DIAGNOSIS — M5430 Sciatica, unspecified side: Secondary | ICD-10-CM | POA: Diagnosis not present

## 2016-05-29 DIAGNOSIS — K219 Gastro-esophageal reflux disease without esophagitis: Secondary | ICD-10-CM | POA: Diagnosis not present

## 2016-05-29 DIAGNOSIS — I10 Essential (primary) hypertension: Secondary | ICD-10-CM | POA: Diagnosis not present

## 2016-05-30 DIAGNOSIS — K219 Gastro-esophageal reflux disease without esophagitis: Secondary | ICD-10-CM | POA: Diagnosis not present

## 2016-05-30 DIAGNOSIS — M5430 Sciatica, unspecified side: Secondary | ICD-10-CM | POA: Diagnosis not present

## 2016-05-30 DIAGNOSIS — I10 Essential (primary) hypertension: Secondary | ICD-10-CM | POA: Diagnosis not present

## 2016-05-31 DIAGNOSIS — M5430 Sciatica, unspecified side: Secondary | ICD-10-CM | POA: Diagnosis not present

## 2016-05-31 DIAGNOSIS — K219 Gastro-esophageal reflux disease without esophagitis: Secondary | ICD-10-CM | POA: Diagnosis not present

## 2016-05-31 DIAGNOSIS — I10 Essential (primary) hypertension: Secondary | ICD-10-CM | POA: Diagnosis not present

## 2016-06-01 DIAGNOSIS — M5430 Sciatica, unspecified side: Secondary | ICD-10-CM | POA: Diagnosis not present

## 2016-06-01 DIAGNOSIS — K219 Gastro-esophageal reflux disease without esophagitis: Secondary | ICD-10-CM | POA: Diagnosis not present

## 2016-06-01 DIAGNOSIS — I10 Essential (primary) hypertension: Secondary | ICD-10-CM | POA: Diagnosis not present

## 2016-06-02 DIAGNOSIS — M5430 Sciatica, unspecified side: Secondary | ICD-10-CM | POA: Diagnosis not present

## 2016-06-02 DIAGNOSIS — K219 Gastro-esophageal reflux disease without esophagitis: Secondary | ICD-10-CM | POA: Diagnosis not present

## 2016-06-02 DIAGNOSIS — I10 Essential (primary) hypertension: Secondary | ICD-10-CM | POA: Diagnosis not present

## 2016-06-03 DIAGNOSIS — I10 Essential (primary) hypertension: Secondary | ICD-10-CM | POA: Diagnosis not present

## 2016-06-03 DIAGNOSIS — M5430 Sciatica, unspecified side: Secondary | ICD-10-CM | POA: Diagnosis not present

## 2016-06-03 DIAGNOSIS — K219 Gastro-esophageal reflux disease without esophagitis: Secondary | ICD-10-CM | POA: Diagnosis not present

## 2016-06-04 DIAGNOSIS — I10 Essential (primary) hypertension: Secondary | ICD-10-CM | POA: Diagnosis not present

## 2016-06-04 DIAGNOSIS — K219 Gastro-esophageal reflux disease without esophagitis: Secondary | ICD-10-CM | POA: Diagnosis not present

## 2016-06-04 DIAGNOSIS — M5430 Sciatica, unspecified side: Secondary | ICD-10-CM | POA: Diagnosis not present

## 2016-06-05 DIAGNOSIS — I10 Essential (primary) hypertension: Secondary | ICD-10-CM | POA: Diagnosis not present

## 2016-06-05 DIAGNOSIS — M5430 Sciatica, unspecified side: Secondary | ICD-10-CM | POA: Diagnosis not present

## 2016-06-05 DIAGNOSIS — K219 Gastro-esophageal reflux disease without esophagitis: Secondary | ICD-10-CM | POA: Diagnosis not present

## 2016-06-06 DIAGNOSIS — M5430 Sciatica, unspecified side: Secondary | ICD-10-CM | POA: Diagnosis not present

## 2016-06-06 DIAGNOSIS — K219 Gastro-esophageal reflux disease without esophagitis: Secondary | ICD-10-CM | POA: Diagnosis not present

## 2016-06-06 DIAGNOSIS — I10 Essential (primary) hypertension: Secondary | ICD-10-CM | POA: Diagnosis not present

## 2016-06-07 DIAGNOSIS — K219 Gastro-esophageal reflux disease without esophagitis: Secondary | ICD-10-CM | POA: Diagnosis not present

## 2016-06-07 DIAGNOSIS — M5430 Sciatica, unspecified side: Secondary | ICD-10-CM | POA: Diagnosis not present

## 2016-06-07 DIAGNOSIS — I10 Essential (primary) hypertension: Secondary | ICD-10-CM | POA: Diagnosis not present

## 2016-06-08 DIAGNOSIS — M5430 Sciatica, unspecified side: Secondary | ICD-10-CM | POA: Diagnosis not present

## 2016-06-08 DIAGNOSIS — I10 Essential (primary) hypertension: Secondary | ICD-10-CM | POA: Diagnosis not present

## 2016-06-08 DIAGNOSIS — K219 Gastro-esophageal reflux disease without esophagitis: Secondary | ICD-10-CM | POA: Diagnosis not present

## 2016-06-09 DIAGNOSIS — M5430 Sciatica, unspecified side: Secondary | ICD-10-CM | POA: Diagnosis not present

## 2016-06-09 DIAGNOSIS — K219 Gastro-esophageal reflux disease without esophagitis: Secondary | ICD-10-CM | POA: Diagnosis not present

## 2016-06-09 DIAGNOSIS — I10 Essential (primary) hypertension: Secondary | ICD-10-CM | POA: Diagnosis not present

## 2016-06-10 DIAGNOSIS — M5430 Sciatica, unspecified side: Secondary | ICD-10-CM | POA: Diagnosis not present

## 2016-06-10 DIAGNOSIS — K219 Gastro-esophageal reflux disease without esophagitis: Secondary | ICD-10-CM | POA: Diagnosis not present

## 2016-06-10 DIAGNOSIS — I10 Essential (primary) hypertension: Secondary | ICD-10-CM | POA: Diagnosis not present

## 2016-06-11 DIAGNOSIS — K219 Gastro-esophageal reflux disease without esophagitis: Secondary | ICD-10-CM | POA: Diagnosis not present

## 2016-06-11 DIAGNOSIS — I10 Essential (primary) hypertension: Secondary | ICD-10-CM | POA: Diagnosis not present

## 2016-06-11 DIAGNOSIS — M5430 Sciatica, unspecified side: Secondary | ICD-10-CM | POA: Diagnosis not present

## 2016-06-12 DIAGNOSIS — I10 Essential (primary) hypertension: Secondary | ICD-10-CM | POA: Diagnosis not present

## 2016-06-12 DIAGNOSIS — M5430 Sciatica, unspecified side: Secondary | ICD-10-CM | POA: Diagnosis not present

## 2016-06-12 DIAGNOSIS — K219 Gastro-esophageal reflux disease without esophagitis: Secondary | ICD-10-CM | POA: Diagnosis not present

## 2016-06-13 DIAGNOSIS — I10 Essential (primary) hypertension: Secondary | ICD-10-CM | POA: Diagnosis not present

## 2016-06-13 DIAGNOSIS — M5430 Sciatica, unspecified side: Secondary | ICD-10-CM | POA: Diagnosis not present

## 2016-06-13 DIAGNOSIS — K219 Gastro-esophageal reflux disease without esophagitis: Secondary | ICD-10-CM | POA: Diagnosis not present

## 2016-06-14 DIAGNOSIS — K219 Gastro-esophageal reflux disease without esophagitis: Secondary | ICD-10-CM | POA: Diagnosis not present

## 2016-06-14 DIAGNOSIS — M5430 Sciatica, unspecified side: Secondary | ICD-10-CM | POA: Diagnosis not present

## 2016-06-14 DIAGNOSIS — I10 Essential (primary) hypertension: Secondary | ICD-10-CM | POA: Diagnosis not present

## 2016-06-15 DIAGNOSIS — M5430 Sciatica, unspecified side: Secondary | ICD-10-CM | POA: Diagnosis not present

## 2016-06-15 DIAGNOSIS — I10 Essential (primary) hypertension: Secondary | ICD-10-CM | POA: Diagnosis not present

## 2016-06-15 DIAGNOSIS — K219 Gastro-esophageal reflux disease without esophagitis: Secondary | ICD-10-CM | POA: Diagnosis not present

## 2016-06-16 DIAGNOSIS — K219 Gastro-esophageal reflux disease without esophagitis: Secondary | ICD-10-CM | POA: Diagnosis not present

## 2016-06-16 DIAGNOSIS — M5430 Sciatica, unspecified side: Secondary | ICD-10-CM | POA: Diagnosis not present

## 2016-06-16 DIAGNOSIS — I10 Essential (primary) hypertension: Secondary | ICD-10-CM | POA: Diagnosis not present

## 2016-06-17 DIAGNOSIS — K219 Gastro-esophageal reflux disease without esophagitis: Secondary | ICD-10-CM | POA: Diagnosis not present

## 2016-06-17 DIAGNOSIS — I10 Essential (primary) hypertension: Secondary | ICD-10-CM | POA: Diagnosis not present

## 2016-06-17 DIAGNOSIS — M5430 Sciatica, unspecified side: Secondary | ICD-10-CM | POA: Diagnosis not present

## 2016-06-18 DIAGNOSIS — I10 Essential (primary) hypertension: Secondary | ICD-10-CM | POA: Diagnosis not present

## 2016-06-18 DIAGNOSIS — K219 Gastro-esophageal reflux disease without esophagitis: Secondary | ICD-10-CM | POA: Diagnosis not present

## 2016-06-18 DIAGNOSIS — M5430 Sciatica, unspecified side: Secondary | ICD-10-CM | POA: Diagnosis not present

## 2016-06-19 DIAGNOSIS — I10 Essential (primary) hypertension: Secondary | ICD-10-CM | POA: Diagnosis not present

## 2016-06-19 DIAGNOSIS — K219 Gastro-esophageal reflux disease without esophagitis: Secondary | ICD-10-CM | POA: Diagnosis not present

## 2016-06-19 DIAGNOSIS — M5430 Sciatica, unspecified side: Secondary | ICD-10-CM | POA: Diagnosis not present

## 2016-06-20 DIAGNOSIS — K219 Gastro-esophageal reflux disease without esophagitis: Secondary | ICD-10-CM | POA: Diagnosis not present

## 2016-06-20 DIAGNOSIS — M5430 Sciatica, unspecified side: Secondary | ICD-10-CM | POA: Diagnosis not present

## 2016-06-20 DIAGNOSIS — I10 Essential (primary) hypertension: Secondary | ICD-10-CM | POA: Diagnosis not present

## 2016-06-21 DIAGNOSIS — K219 Gastro-esophageal reflux disease without esophagitis: Secondary | ICD-10-CM | POA: Diagnosis not present

## 2016-06-21 DIAGNOSIS — I10 Essential (primary) hypertension: Secondary | ICD-10-CM | POA: Diagnosis not present

## 2016-06-21 DIAGNOSIS — M5430 Sciatica, unspecified side: Secondary | ICD-10-CM | POA: Diagnosis not present

## 2016-06-22 DIAGNOSIS — I10 Essential (primary) hypertension: Secondary | ICD-10-CM | POA: Diagnosis not present

## 2016-06-22 DIAGNOSIS — K219 Gastro-esophageal reflux disease without esophagitis: Secondary | ICD-10-CM | POA: Diagnosis not present

## 2016-06-22 DIAGNOSIS — M5430 Sciatica, unspecified side: Secondary | ICD-10-CM | POA: Diagnosis not present

## 2016-06-23 DIAGNOSIS — K219 Gastro-esophageal reflux disease without esophagitis: Secondary | ICD-10-CM | POA: Diagnosis not present

## 2016-06-23 DIAGNOSIS — I10 Essential (primary) hypertension: Secondary | ICD-10-CM | POA: Diagnosis not present

## 2016-06-23 DIAGNOSIS — M5430 Sciatica, unspecified side: Secondary | ICD-10-CM | POA: Diagnosis not present

## 2016-06-24 DIAGNOSIS — M5430 Sciatica, unspecified side: Secondary | ICD-10-CM | POA: Diagnosis not present

## 2016-06-24 DIAGNOSIS — I10 Essential (primary) hypertension: Secondary | ICD-10-CM | POA: Diagnosis not present

## 2016-06-24 DIAGNOSIS — K219 Gastro-esophageal reflux disease without esophagitis: Secondary | ICD-10-CM | POA: Diagnosis not present

## 2016-06-25 DIAGNOSIS — I10 Essential (primary) hypertension: Secondary | ICD-10-CM | POA: Diagnosis not present

## 2016-06-25 DIAGNOSIS — M5430 Sciatica, unspecified side: Secondary | ICD-10-CM | POA: Diagnosis not present

## 2016-06-25 DIAGNOSIS — K219 Gastro-esophageal reflux disease without esophagitis: Secondary | ICD-10-CM | POA: Diagnosis not present

## 2016-06-26 DIAGNOSIS — K219 Gastro-esophageal reflux disease without esophagitis: Secondary | ICD-10-CM | POA: Diagnosis not present

## 2016-06-26 DIAGNOSIS — M5430 Sciatica, unspecified side: Secondary | ICD-10-CM | POA: Diagnosis not present

## 2016-06-26 DIAGNOSIS — I10 Essential (primary) hypertension: Secondary | ICD-10-CM | POA: Diagnosis not present

## 2016-06-27 DIAGNOSIS — I10 Essential (primary) hypertension: Secondary | ICD-10-CM | POA: Diagnosis not present

## 2016-06-27 DIAGNOSIS — K219 Gastro-esophageal reflux disease without esophagitis: Secondary | ICD-10-CM | POA: Diagnosis not present

## 2016-06-27 DIAGNOSIS — M5430 Sciatica, unspecified side: Secondary | ICD-10-CM | POA: Diagnosis not present

## 2016-06-28 DIAGNOSIS — K219 Gastro-esophageal reflux disease without esophagitis: Secondary | ICD-10-CM | POA: Diagnosis not present

## 2016-06-28 DIAGNOSIS — I10 Essential (primary) hypertension: Secondary | ICD-10-CM | POA: Diagnosis not present

## 2016-06-28 DIAGNOSIS — M5430 Sciatica, unspecified side: Secondary | ICD-10-CM | POA: Diagnosis not present

## 2016-06-29 DIAGNOSIS — I10 Essential (primary) hypertension: Secondary | ICD-10-CM | POA: Diagnosis not present

## 2016-06-29 DIAGNOSIS — K219 Gastro-esophageal reflux disease without esophagitis: Secondary | ICD-10-CM | POA: Diagnosis not present

## 2016-06-29 DIAGNOSIS — M5430 Sciatica, unspecified side: Secondary | ICD-10-CM | POA: Diagnosis not present

## 2016-06-30 DIAGNOSIS — I10 Essential (primary) hypertension: Secondary | ICD-10-CM | POA: Diagnosis not present

## 2016-06-30 DIAGNOSIS — K219 Gastro-esophageal reflux disease without esophagitis: Secondary | ICD-10-CM | POA: Diagnosis not present

## 2016-06-30 DIAGNOSIS — M5430 Sciatica, unspecified side: Secondary | ICD-10-CM | POA: Diagnosis not present

## 2016-07-01 DIAGNOSIS — M5430 Sciatica, unspecified side: Secondary | ICD-10-CM | POA: Diagnosis not present

## 2016-07-01 DIAGNOSIS — I10 Essential (primary) hypertension: Secondary | ICD-10-CM | POA: Diagnosis not present

## 2016-07-01 DIAGNOSIS — K219 Gastro-esophageal reflux disease without esophagitis: Secondary | ICD-10-CM | POA: Diagnosis not present

## 2016-07-02 DIAGNOSIS — M5430 Sciatica, unspecified side: Secondary | ICD-10-CM | POA: Diagnosis not present

## 2016-07-02 DIAGNOSIS — I10 Essential (primary) hypertension: Secondary | ICD-10-CM | POA: Diagnosis not present

## 2016-07-02 DIAGNOSIS — K219 Gastro-esophageal reflux disease without esophagitis: Secondary | ICD-10-CM | POA: Diagnosis not present

## 2016-07-03 DIAGNOSIS — K219 Gastro-esophageal reflux disease without esophagitis: Secondary | ICD-10-CM | POA: Diagnosis not present

## 2016-07-03 DIAGNOSIS — M5430 Sciatica, unspecified side: Secondary | ICD-10-CM | POA: Diagnosis not present

## 2016-07-03 DIAGNOSIS — I10 Essential (primary) hypertension: Secondary | ICD-10-CM | POA: Diagnosis not present

## 2016-07-04 DIAGNOSIS — I10 Essential (primary) hypertension: Secondary | ICD-10-CM | POA: Diagnosis not present

## 2016-07-04 DIAGNOSIS — K219 Gastro-esophageal reflux disease without esophagitis: Secondary | ICD-10-CM | POA: Diagnosis not present

## 2016-07-04 DIAGNOSIS — M5430 Sciatica, unspecified side: Secondary | ICD-10-CM | POA: Diagnosis not present

## 2016-07-05 DIAGNOSIS — M5430 Sciatica, unspecified side: Secondary | ICD-10-CM | POA: Diagnosis not present

## 2016-07-05 DIAGNOSIS — K219 Gastro-esophageal reflux disease without esophagitis: Secondary | ICD-10-CM | POA: Diagnosis not present

## 2016-07-05 DIAGNOSIS — I10 Essential (primary) hypertension: Secondary | ICD-10-CM | POA: Diagnosis not present

## 2016-07-06 DIAGNOSIS — M5430 Sciatica, unspecified side: Secondary | ICD-10-CM | POA: Diagnosis not present

## 2016-07-06 DIAGNOSIS — I10 Essential (primary) hypertension: Secondary | ICD-10-CM | POA: Diagnosis not present

## 2016-07-06 DIAGNOSIS — K219 Gastro-esophageal reflux disease without esophagitis: Secondary | ICD-10-CM | POA: Diagnosis not present

## 2016-07-07 DIAGNOSIS — K219 Gastro-esophageal reflux disease without esophagitis: Secondary | ICD-10-CM | POA: Diagnosis not present

## 2016-07-07 DIAGNOSIS — I10 Essential (primary) hypertension: Secondary | ICD-10-CM | POA: Diagnosis not present

## 2016-07-07 DIAGNOSIS — M5430 Sciatica, unspecified side: Secondary | ICD-10-CM | POA: Diagnosis not present

## 2016-07-08 DIAGNOSIS — M5430 Sciatica, unspecified side: Secondary | ICD-10-CM | POA: Diagnosis not present

## 2016-07-08 DIAGNOSIS — K219 Gastro-esophageal reflux disease without esophagitis: Secondary | ICD-10-CM | POA: Diagnosis not present

## 2016-07-08 DIAGNOSIS — I10 Essential (primary) hypertension: Secondary | ICD-10-CM | POA: Diagnosis not present

## 2016-07-09 DIAGNOSIS — K219 Gastro-esophageal reflux disease without esophagitis: Secondary | ICD-10-CM | POA: Diagnosis not present

## 2016-07-09 DIAGNOSIS — M5430 Sciatica, unspecified side: Secondary | ICD-10-CM | POA: Diagnosis not present

## 2016-07-09 DIAGNOSIS — I10 Essential (primary) hypertension: Secondary | ICD-10-CM | POA: Diagnosis not present

## 2016-07-10 DIAGNOSIS — K219 Gastro-esophageal reflux disease without esophagitis: Secondary | ICD-10-CM | POA: Diagnosis not present

## 2016-07-10 DIAGNOSIS — M5430 Sciatica, unspecified side: Secondary | ICD-10-CM | POA: Diagnosis not present

## 2016-07-10 DIAGNOSIS — I10 Essential (primary) hypertension: Secondary | ICD-10-CM | POA: Diagnosis not present

## 2016-07-11 DIAGNOSIS — I10 Essential (primary) hypertension: Secondary | ICD-10-CM | POA: Diagnosis not present

## 2016-07-11 DIAGNOSIS — K219 Gastro-esophageal reflux disease without esophagitis: Secondary | ICD-10-CM | POA: Diagnosis not present

## 2016-07-11 DIAGNOSIS — M5430 Sciatica, unspecified side: Secondary | ICD-10-CM | POA: Diagnosis not present

## 2016-07-12 DIAGNOSIS — K219 Gastro-esophageal reflux disease without esophagitis: Secondary | ICD-10-CM | POA: Diagnosis not present

## 2016-07-12 DIAGNOSIS — M5430 Sciatica, unspecified side: Secondary | ICD-10-CM | POA: Diagnosis not present

## 2016-07-12 DIAGNOSIS — I10 Essential (primary) hypertension: Secondary | ICD-10-CM | POA: Diagnosis not present

## 2016-07-13 DIAGNOSIS — M5430 Sciatica, unspecified side: Secondary | ICD-10-CM | POA: Diagnosis not present

## 2016-07-13 DIAGNOSIS — I10 Essential (primary) hypertension: Secondary | ICD-10-CM | POA: Diagnosis not present

## 2016-07-13 DIAGNOSIS — K219 Gastro-esophageal reflux disease without esophagitis: Secondary | ICD-10-CM | POA: Diagnosis not present

## 2016-07-14 DIAGNOSIS — I10 Essential (primary) hypertension: Secondary | ICD-10-CM | POA: Diagnosis not present

## 2016-07-14 DIAGNOSIS — K219 Gastro-esophageal reflux disease without esophagitis: Secondary | ICD-10-CM | POA: Diagnosis not present

## 2016-07-14 DIAGNOSIS — M5430 Sciatica, unspecified side: Secondary | ICD-10-CM | POA: Diagnosis not present

## 2016-07-15 DIAGNOSIS — M5430 Sciatica, unspecified side: Secondary | ICD-10-CM | POA: Diagnosis not present

## 2016-07-15 DIAGNOSIS — I10 Essential (primary) hypertension: Secondary | ICD-10-CM | POA: Diagnosis not present

## 2016-07-15 DIAGNOSIS — K219 Gastro-esophageal reflux disease without esophagitis: Secondary | ICD-10-CM | POA: Diagnosis not present

## 2016-07-16 DIAGNOSIS — M5430 Sciatica, unspecified side: Secondary | ICD-10-CM | POA: Diagnosis not present

## 2016-07-16 DIAGNOSIS — I10 Essential (primary) hypertension: Secondary | ICD-10-CM | POA: Diagnosis not present

## 2016-07-16 DIAGNOSIS — K219 Gastro-esophageal reflux disease without esophagitis: Secondary | ICD-10-CM | POA: Diagnosis not present

## 2016-07-17 DIAGNOSIS — M5430 Sciatica, unspecified side: Secondary | ICD-10-CM | POA: Diagnosis not present

## 2016-07-17 DIAGNOSIS — K219 Gastro-esophageal reflux disease without esophagitis: Secondary | ICD-10-CM | POA: Diagnosis not present

## 2016-07-17 DIAGNOSIS — I10 Essential (primary) hypertension: Secondary | ICD-10-CM | POA: Diagnosis not present

## 2016-07-18 DIAGNOSIS — M5430 Sciatica, unspecified side: Secondary | ICD-10-CM | POA: Diagnosis not present

## 2016-07-18 DIAGNOSIS — K219 Gastro-esophageal reflux disease without esophagitis: Secondary | ICD-10-CM | POA: Diagnosis not present

## 2016-07-18 DIAGNOSIS — I10 Essential (primary) hypertension: Secondary | ICD-10-CM | POA: Diagnosis not present

## 2016-07-19 DIAGNOSIS — M5430 Sciatica, unspecified side: Secondary | ICD-10-CM | POA: Diagnosis not present

## 2016-07-19 DIAGNOSIS — K219 Gastro-esophageal reflux disease without esophagitis: Secondary | ICD-10-CM | POA: Diagnosis not present

## 2016-07-19 DIAGNOSIS — I10 Essential (primary) hypertension: Secondary | ICD-10-CM | POA: Diagnosis not present

## 2016-07-20 DIAGNOSIS — M5430 Sciatica, unspecified side: Secondary | ICD-10-CM | POA: Diagnosis not present

## 2016-07-20 DIAGNOSIS — I10 Essential (primary) hypertension: Secondary | ICD-10-CM | POA: Diagnosis not present

## 2016-07-20 DIAGNOSIS — K219 Gastro-esophageal reflux disease without esophagitis: Secondary | ICD-10-CM | POA: Diagnosis not present

## 2016-07-21 DIAGNOSIS — M5430 Sciatica, unspecified side: Secondary | ICD-10-CM | POA: Diagnosis not present

## 2016-07-21 DIAGNOSIS — K219 Gastro-esophageal reflux disease without esophagitis: Secondary | ICD-10-CM | POA: Diagnosis not present

## 2016-07-21 DIAGNOSIS — I10 Essential (primary) hypertension: Secondary | ICD-10-CM | POA: Diagnosis not present

## 2016-07-22 DIAGNOSIS — K219 Gastro-esophageal reflux disease without esophagitis: Secondary | ICD-10-CM | POA: Diagnosis not present

## 2016-07-22 DIAGNOSIS — I10 Essential (primary) hypertension: Secondary | ICD-10-CM | POA: Diagnosis not present

## 2016-07-22 DIAGNOSIS — M5430 Sciatica, unspecified side: Secondary | ICD-10-CM | POA: Diagnosis not present

## 2016-07-23 DIAGNOSIS — M5430 Sciatica, unspecified side: Secondary | ICD-10-CM | POA: Diagnosis not present

## 2016-07-23 DIAGNOSIS — K219 Gastro-esophageal reflux disease without esophagitis: Secondary | ICD-10-CM | POA: Diagnosis not present

## 2016-07-23 DIAGNOSIS — I10 Essential (primary) hypertension: Secondary | ICD-10-CM | POA: Diagnosis not present

## 2016-07-24 DIAGNOSIS — M5430 Sciatica, unspecified side: Secondary | ICD-10-CM | POA: Diagnosis not present

## 2016-07-24 DIAGNOSIS — K219 Gastro-esophageal reflux disease without esophagitis: Secondary | ICD-10-CM | POA: Diagnosis not present

## 2016-07-24 DIAGNOSIS — I10 Essential (primary) hypertension: Secondary | ICD-10-CM | POA: Diagnosis not present

## 2016-07-25 DIAGNOSIS — I10 Essential (primary) hypertension: Secondary | ICD-10-CM | POA: Diagnosis not present

## 2016-07-25 DIAGNOSIS — M5430 Sciatica, unspecified side: Secondary | ICD-10-CM | POA: Diagnosis not present

## 2016-07-25 DIAGNOSIS — K219 Gastro-esophageal reflux disease without esophagitis: Secondary | ICD-10-CM | POA: Diagnosis not present

## 2016-07-26 DIAGNOSIS — M5430 Sciatica, unspecified side: Secondary | ICD-10-CM | POA: Diagnosis not present

## 2016-07-26 DIAGNOSIS — K219 Gastro-esophageal reflux disease without esophagitis: Secondary | ICD-10-CM | POA: Diagnosis not present

## 2016-07-26 DIAGNOSIS — I10 Essential (primary) hypertension: Secondary | ICD-10-CM | POA: Diagnosis not present

## 2016-07-27 DIAGNOSIS — M5430 Sciatica, unspecified side: Secondary | ICD-10-CM | POA: Diagnosis not present

## 2016-07-27 DIAGNOSIS — K219 Gastro-esophageal reflux disease without esophagitis: Secondary | ICD-10-CM | POA: Diagnosis not present

## 2016-07-27 DIAGNOSIS — I10 Essential (primary) hypertension: Secondary | ICD-10-CM | POA: Diagnosis not present

## 2016-07-28 DIAGNOSIS — M5430 Sciatica, unspecified side: Secondary | ICD-10-CM | POA: Diagnosis not present

## 2016-07-28 DIAGNOSIS — I10 Essential (primary) hypertension: Secondary | ICD-10-CM | POA: Diagnosis not present

## 2016-07-28 DIAGNOSIS — K219 Gastro-esophageal reflux disease without esophagitis: Secondary | ICD-10-CM | POA: Diagnosis not present

## 2016-07-29 DIAGNOSIS — I10 Essential (primary) hypertension: Secondary | ICD-10-CM | POA: Diagnosis not present

## 2016-07-29 DIAGNOSIS — K219 Gastro-esophageal reflux disease without esophagitis: Secondary | ICD-10-CM | POA: Diagnosis not present

## 2016-07-29 DIAGNOSIS — M5430 Sciatica, unspecified side: Secondary | ICD-10-CM | POA: Diagnosis not present

## 2016-07-30 DIAGNOSIS — M5430 Sciatica, unspecified side: Secondary | ICD-10-CM | POA: Diagnosis not present

## 2016-07-30 DIAGNOSIS — K219 Gastro-esophageal reflux disease without esophagitis: Secondary | ICD-10-CM | POA: Diagnosis not present

## 2016-07-30 DIAGNOSIS — I10 Essential (primary) hypertension: Secondary | ICD-10-CM | POA: Diagnosis not present

## 2016-07-31 DIAGNOSIS — M5430 Sciatica, unspecified side: Secondary | ICD-10-CM | POA: Diagnosis not present

## 2016-07-31 DIAGNOSIS — I10 Essential (primary) hypertension: Secondary | ICD-10-CM | POA: Diagnosis not present

## 2016-07-31 DIAGNOSIS — K219 Gastro-esophageal reflux disease without esophagitis: Secondary | ICD-10-CM | POA: Diagnosis not present

## 2016-08-01 DIAGNOSIS — K219 Gastro-esophageal reflux disease without esophagitis: Secondary | ICD-10-CM | POA: Diagnosis not present

## 2016-08-01 DIAGNOSIS — I10 Essential (primary) hypertension: Secondary | ICD-10-CM | POA: Diagnosis not present

## 2016-08-01 DIAGNOSIS — M5430 Sciatica, unspecified side: Secondary | ICD-10-CM | POA: Diagnosis not present

## 2016-08-02 DIAGNOSIS — I10 Essential (primary) hypertension: Secondary | ICD-10-CM | POA: Diagnosis not present

## 2016-08-02 DIAGNOSIS — M5430 Sciatica, unspecified side: Secondary | ICD-10-CM | POA: Diagnosis not present

## 2016-08-02 DIAGNOSIS — K219 Gastro-esophageal reflux disease without esophagitis: Secondary | ICD-10-CM | POA: Diagnosis not present

## 2016-08-03 DIAGNOSIS — M5430 Sciatica, unspecified side: Secondary | ICD-10-CM | POA: Diagnosis not present

## 2016-08-03 DIAGNOSIS — I10 Essential (primary) hypertension: Secondary | ICD-10-CM | POA: Diagnosis not present

## 2016-08-03 DIAGNOSIS — K219 Gastro-esophageal reflux disease without esophagitis: Secondary | ICD-10-CM | POA: Diagnosis not present

## 2016-08-04 DIAGNOSIS — M5430 Sciatica, unspecified side: Secondary | ICD-10-CM | POA: Diagnosis not present

## 2016-08-04 DIAGNOSIS — K219 Gastro-esophageal reflux disease without esophagitis: Secondary | ICD-10-CM | POA: Diagnosis not present

## 2016-08-04 DIAGNOSIS — I10 Essential (primary) hypertension: Secondary | ICD-10-CM | POA: Diagnosis not present

## 2016-08-05 DIAGNOSIS — M5430 Sciatica, unspecified side: Secondary | ICD-10-CM | POA: Diagnosis not present

## 2016-08-05 DIAGNOSIS — K219 Gastro-esophageal reflux disease without esophagitis: Secondary | ICD-10-CM | POA: Diagnosis not present

## 2016-08-05 DIAGNOSIS — I10 Essential (primary) hypertension: Secondary | ICD-10-CM | POA: Diagnosis not present

## 2016-08-06 DIAGNOSIS — M5430 Sciatica, unspecified side: Secondary | ICD-10-CM | POA: Diagnosis not present

## 2016-08-06 DIAGNOSIS — K219 Gastro-esophageal reflux disease without esophagitis: Secondary | ICD-10-CM | POA: Diagnosis not present

## 2016-08-06 DIAGNOSIS — I10 Essential (primary) hypertension: Secondary | ICD-10-CM | POA: Diagnosis not present

## 2016-08-07 DIAGNOSIS — I10 Essential (primary) hypertension: Secondary | ICD-10-CM | POA: Diagnosis not present

## 2016-08-07 DIAGNOSIS — M5430 Sciatica, unspecified side: Secondary | ICD-10-CM | POA: Diagnosis not present

## 2016-08-07 DIAGNOSIS — K219 Gastro-esophageal reflux disease without esophagitis: Secondary | ICD-10-CM | POA: Diagnosis not present

## 2016-08-08 DIAGNOSIS — I10 Essential (primary) hypertension: Secondary | ICD-10-CM | POA: Diagnosis not present

## 2016-08-08 DIAGNOSIS — M5430 Sciatica, unspecified side: Secondary | ICD-10-CM | POA: Diagnosis not present

## 2016-08-08 DIAGNOSIS — K219 Gastro-esophageal reflux disease without esophagitis: Secondary | ICD-10-CM | POA: Diagnosis not present

## 2016-08-09 DIAGNOSIS — I10 Essential (primary) hypertension: Secondary | ICD-10-CM | POA: Diagnosis not present

## 2016-08-09 DIAGNOSIS — K219 Gastro-esophageal reflux disease without esophagitis: Secondary | ICD-10-CM | POA: Diagnosis not present

## 2016-08-09 DIAGNOSIS — M5430 Sciatica, unspecified side: Secondary | ICD-10-CM | POA: Diagnosis not present

## 2016-08-10 DIAGNOSIS — I10 Essential (primary) hypertension: Secondary | ICD-10-CM | POA: Diagnosis not present

## 2016-08-10 DIAGNOSIS — M5430 Sciatica, unspecified side: Secondary | ICD-10-CM | POA: Diagnosis not present

## 2016-08-10 DIAGNOSIS — K219 Gastro-esophageal reflux disease without esophagitis: Secondary | ICD-10-CM | POA: Diagnosis not present

## 2016-08-11 DIAGNOSIS — K219 Gastro-esophageal reflux disease without esophagitis: Secondary | ICD-10-CM | POA: Diagnosis not present

## 2016-08-11 DIAGNOSIS — I10 Essential (primary) hypertension: Secondary | ICD-10-CM | POA: Diagnosis not present

## 2016-08-11 DIAGNOSIS — M5430 Sciatica, unspecified side: Secondary | ICD-10-CM | POA: Diagnosis not present

## 2016-08-12 DIAGNOSIS — M5430 Sciatica, unspecified side: Secondary | ICD-10-CM | POA: Diagnosis not present

## 2016-08-12 DIAGNOSIS — K219 Gastro-esophageal reflux disease without esophagitis: Secondary | ICD-10-CM | POA: Diagnosis not present

## 2016-08-12 DIAGNOSIS — I10 Essential (primary) hypertension: Secondary | ICD-10-CM | POA: Diagnosis not present

## 2016-08-13 DIAGNOSIS — K219 Gastro-esophageal reflux disease without esophagitis: Secondary | ICD-10-CM | POA: Diagnosis not present

## 2016-08-13 DIAGNOSIS — M5430 Sciatica, unspecified side: Secondary | ICD-10-CM | POA: Diagnosis not present

## 2016-08-13 DIAGNOSIS — I10 Essential (primary) hypertension: Secondary | ICD-10-CM | POA: Diagnosis not present

## 2016-08-15 DIAGNOSIS — M5430 Sciatica, unspecified side: Secondary | ICD-10-CM | POA: Diagnosis not present

## 2016-08-15 DIAGNOSIS — K219 Gastro-esophageal reflux disease without esophagitis: Secondary | ICD-10-CM | POA: Diagnosis not present

## 2016-08-15 DIAGNOSIS — I10 Essential (primary) hypertension: Secondary | ICD-10-CM | POA: Diagnosis not present

## 2016-08-16 DIAGNOSIS — M5430 Sciatica, unspecified side: Secondary | ICD-10-CM | POA: Diagnosis not present

## 2016-08-16 DIAGNOSIS — I10 Essential (primary) hypertension: Secondary | ICD-10-CM | POA: Diagnosis not present

## 2016-08-16 DIAGNOSIS — K219 Gastro-esophageal reflux disease without esophagitis: Secondary | ICD-10-CM | POA: Diagnosis not present

## 2016-08-17 DIAGNOSIS — M5430 Sciatica, unspecified side: Secondary | ICD-10-CM | POA: Diagnosis not present

## 2016-08-17 DIAGNOSIS — K219 Gastro-esophageal reflux disease without esophagitis: Secondary | ICD-10-CM | POA: Diagnosis not present

## 2016-08-17 DIAGNOSIS — I10 Essential (primary) hypertension: Secondary | ICD-10-CM | POA: Diagnosis not present

## 2016-08-18 DIAGNOSIS — K219 Gastro-esophageal reflux disease without esophagitis: Secondary | ICD-10-CM | POA: Diagnosis not present

## 2016-08-18 DIAGNOSIS — I10 Essential (primary) hypertension: Secondary | ICD-10-CM | POA: Diagnosis not present

## 2016-08-18 DIAGNOSIS — M5430 Sciatica, unspecified side: Secondary | ICD-10-CM | POA: Diagnosis not present

## 2016-08-19 DIAGNOSIS — K219 Gastro-esophageal reflux disease without esophagitis: Secondary | ICD-10-CM | POA: Diagnosis not present

## 2016-08-19 DIAGNOSIS — I10 Essential (primary) hypertension: Secondary | ICD-10-CM | POA: Diagnosis not present

## 2016-08-19 DIAGNOSIS — M5430 Sciatica, unspecified side: Secondary | ICD-10-CM | POA: Diagnosis not present

## 2016-08-20 DIAGNOSIS — M5430 Sciatica, unspecified side: Secondary | ICD-10-CM | POA: Diagnosis not present

## 2016-08-20 DIAGNOSIS — K219 Gastro-esophageal reflux disease without esophagitis: Secondary | ICD-10-CM | POA: Diagnosis not present

## 2016-08-20 DIAGNOSIS — I10 Essential (primary) hypertension: Secondary | ICD-10-CM | POA: Diagnosis not present

## 2016-08-21 DIAGNOSIS — I10 Essential (primary) hypertension: Secondary | ICD-10-CM | POA: Diagnosis not present

## 2016-08-21 DIAGNOSIS — M5430 Sciatica, unspecified side: Secondary | ICD-10-CM | POA: Diagnosis not present

## 2016-08-21 DIAGNOSIS — K219 Gastro-esophageal reflux disease without esophagitis: Secondary | ICD-10-CM | POA: Diagnosis not present

## 2016-08-22 DIAGNOSIS — K219 Gastro-esophageal reflux disease without esophagitis: Secondary | ICD-10-CM | POA: Diagnosis not present

## 2016-08-22 DIAGNOSIS — I10 Essential (primary) hypertension: Secondary | ICD-10-CM | POA: Diagnosis not present

## 2016-08-22 DIAGNOSIS — M5430 Sciatica, unspecified side: Secondary | ICD-10-CM | POA: Diagnosis not present

## 2016-08-23 DIAGNOSIS — M5430 Sciatica, unspecified side: Secondary | ICD-10-CM | POA: Diagnosis not present

## 2016-08-23 DIAGNOSIS — I10 Essential (primary) hypertension: Secondary | ICD-10-CM | POA: Diagnosis not present

## 2016-08-23 DIAGNOSIS — K219 Gastro-esophageal reflux disease without esophagitis: Secondary | ICD-10-CM | POA: Diagnosis not present

## 2016-08-24 DIAGNOSIS — K219 Gastro-esophageal reflux disease without esophagitis: Secondary | ICD-10-CM | POA: Diagnosis not present

## 2016-08-24 DIAGNOSIS — I10 Essential (primary) hypertension: Secondary | ICD-10-CM | POA: Diagnosis not present

## 2016-08-24 DIAGNOSIS — M5430 Sciatica, unspecified side: Secondary | ICD-10-CM | POA: Diagnosis not present

## 2016-08-25 DIAGNOSIS — M5430 Sciatica, unspecified side: Secondary | ICD-10-CM | POA: Diagnosis not present

## 2016-08-25 DIAGNOSIS — I10 Essential (primary) hypertension: Secondary | ICD-10-CM | POA: Diagnosis not present

## 2016-08-25 DIAGNOSIS — K219 Gastro-esophageal reflux disease without esophagitis: Secondary | ICD-10-CM | POA: Diagnosis not present

## 2016-08-26 DIAGNOSIS — M5430 Sciatica, unspecified side: Secondary | ICD-10-CM | POA: Diagnosis not present

## 2016-08-26 DIAGNOSIS — K219 Gastro-esophageal reflux disease without esophagitis: Secondary | ICD-10-CM | POA: Diagnosis not present

## 2016-08-26 DIAGNOSIS — I10 Essential (primary) hypertension: Secondary | ICD-10-CM | POA: Diagnosis not present

## 2016-08-27 DIAGNOSIS — M5430 Sciatica, unspecified side: Secondary | ICD-10-CM | POA: Diagnosis not present

## 2016-08-27 DIAGNOSIS — K219 Gastro-esophageal reflux disease without esophagitis: Secondary | ICD-10-CM | POA: Diagnosis not present

## 2016-08-27 DIAGNOSIS — I10 Essential (primary) hypertension: Secondary | ICD-10-CM | POA: Diagnosis not present

## 2016-08-28 DIAGNOSIS — M5430 Sciatica, unspecified side: Secondary | ICD-10-CM | POA: Diagnosis not present

## 2016-08-28 DIAGNOSIS — K219 Gastro-esophageal reflux disease without esophagitis: Secondary | ICD-10-CM | POA: Diagnosis not present

## 2016-08-28 DIAGNOSIS — I10 Essential (primary) hypertension: Secondary | ICD-10-CM | POA: Diagnosis not present

## 2016-08-29 DIAGNOSIS — K219 Gastro-esophageal reflux disease without esophagitis: Secondary | ICD-10-CM | POA: Diagnosis not present

## 2016-08-29 DIAGNOSIS — M5430 Sciatica, unspecified side: Secondary | ICD-10-CM | POA: Diagnosis not present

## 2016-08-29 DIAGNOSIS — I10 Essential (primary) hypertension: Secondary | ICD-10-CM | POA: Diagnosis not present

## 2016-08-30 DIAGNOSIS — K219 Gastro-esophageal reflux disease without esophagitis: Secondary | ICD-10-CM | POA: Diagnosis not present

## 2016-08-30 DIAGNOSIS — M5430 Sciatica, unspecified side: Secondary | ICD-10-CM | POA: Diagnosis not present

## 2016-08-30 DIAGNOSIS — I10 Essential (primary) hypertension: Secondary | ICD-10-CM | POA: Diagnosis not present

## 2016-08-31 DIAGNOSIS — K219 Gastro-esophageal reflux disease without esophagitis: Secondary | ICD-10-CM | POA: Diagnosis not present

## 2016-08-31 DIAGNOSIS — M5430 Sciatica, unspecified side: Secondary | ICD-10-CM | POA: Diagnosis not present

## 2016-08-31 DIAGNOSIS — I10 Essential (primary) hypertension: Secondary | ICD-10-CM | POA: Diagnosis not present

## 2016-09-01 DIAGNOSIS — K219 Gastro-esophageal reflux disease without esophagitis: Secondary | ICD-10-CM | POA: Diagnosis not present

## 2016-09-01 DIAGNOSIS — M5430 Sciatica, unspecified side: Secondary | ICD-10-CM | POA: Diagnosis not present

## 2016-09-01 DIAGNOSIS — I10 Essential (primary) hypertension: Secondary | ICD-10-CM | POA: Diagnosis not present

## 2016-09-02 DIAGNOSIS — K219 Gastro-esophageal reflux disease without esophagitis: Secondary | ICD-10-CM | POA: Diagnosis not present

## 2016-09-02 DIAGNOSIS — I10 Essential (primary) hypertension: Secondary | ICD-10-CM | POA: Diagnosis not present

## 2016-09-02 DIAGNOSIS — M5430 Sciatica, unspecified side: Secondary | ICD-10-CM | POA: Diagnosis not present

## 2016-09-03 DIAGNOSIS — I10 Essential (primary) hypertension: Secondary | ICD-10-CM | POA: Diagnosis not present

## 2016-09-03 DIAGNOSIS — K219 Gastro-esophageal reflux disease without esophagitis: Secondary | ICD-10-CM | POA: Diagnosis not present

## 2016-09-03 DIAGNOSIS — M5430 Sciatica, unspecified side: Secondary | ICD-10-CM | POA: Diagnosis not present

## 2016-09-04 DIAGNOSIS — I10 Essential (primary) hypertension: Secondary | ICD-10-CM | POA: Diagnosis not present

## 2016-09-04 DIAGNOSIS — K219 Gastro-esophageal reflux disease without esophagitis: Secondary | ICD-10-CM | POA: Diagnosis not present

## 2016-09-04 DIAGNOSIS — M5430 Sciatica, unspecified side: Secondary | ICD-10-CM | POA: Diagnosis not present

## 2016-09-05 DIAGNOSIS — M5430 Sciatica, unspecified side: Secondary | ICD-10-CM | POA: Diagnosis not present

## 2016-09-05 DIAGNOSIS — K219 Gastro-esophageal reflux disease without esophagitis: Secondary | ICD-10-CM | POA: Diagnosis not present

## 2016-09-05 DIAGNOSIS — I10 Essential (primary) hypertension: Secondary | ICD-10-CM | POA: Diagnosis not present

## 2016-09-06 DIAGNOSIS — I10 Essential (primary) hypertension: Secondary | ICD-10-CM | POA: Diagnosis not present

## 2016-09-06 DIAGNOSIS — M5430 Sciatica, unspecified side: Secondary | ICD-10-CM | POA: Diagnosis not present

## 2016-09-06 DIAGNOSIS — K219 Gastro-esophageal reflux disease without esophagitis: Secondary | ICD-10-CM | POA: Diagnosis not present

## 2016-09-07 DIAGNOSIS — M5430 Sciatica, unspecified side: Secondary | ICD-10-CM | POA: Diagnosis not present

## 2016-09-07 DIAGNOSIS — I10 Essential (primary) hypertension: Secondary | ICD-10-CM | POA: Diagnosis not present

## 2016-09-07 DIAGNOSIS — K219 Gastro-esophageal reflux disease without esophagitis: Secondary | ICD-10-CM | POA: Diagnosis not present

## 2016-09-08 DIAGNOSIS — I10 Essential (primary) hypertension: Secondary | ICD-10-CM | POA: Diagnosis not present

## 2016-09-08 DIAGNOSIS — M5430 Sciatica, unspecified side: Secondary | ICD-10-CM | POA: Diagnosis not present

## 2016-09-08 DIAGNOSIS — K219 Gastro-esophageal reflux disease without esophagitis: Secondary | ICD-10-CM | POA: Diagnosis not present

## 2016-09-09 DIAGNOSIS — I10 Essential (primary) hypertension: Secondary | ICD-10-CM | POA: Diagnosis not present

## 2016-09-09 DIAGNOSIS — M5430 Sciatica, unspecified side: Secondary | ICD-10-CM | POA: Diagnosis not present

## 2016-09-09 DIAGNOSIS — K219 Gastro-esophageal reflux disease without esophagitis: Secondary | ICD-10-CM | POA: Diagnosis not present

## 2016-09-10 DIAGNOSIS — M5430 Sciatica, unspecified side: Secondary | ICD-10-CM | POA: Diagnosis not present

## 2016-09-10 DIAGNOSIS — I10 Essential (primary) hypertension: Secondary | ICD-10-CM | POA: Diagnosis not present

## 2016-09-10 DIAGNOSIS — K219 Gastro-esophageal reflux disease without esophagitis: Secondary | ICD-10-CM | POA: Diagnosis not present

## 2016-09-11 DIAGNOSIS — M5430 Sciatica, unspecified side: Secondary | ICD-10-CM | POA: Diagnosis not present

## 2016-09-11 DIAGNOSIS — I10 Essential (primary) hypertension: Secondary | ICD-10-CM | POA: Diagnosis not present

## 2016-09-11 DIAGNOSIS — K219 Gastro-esophageal reflux disease without esophagitis: Secondary | ICD-10-CM | POA: Diagnosis not present

## 2016-09-12 DIAGNOSIS — M5430 Sciatica, unspecified side: Secondary | ICD-10-CM | POA: Diagnosis not present

## 2016-09-12 DIAGNOSIS — I10 Essential (primary) hypertension: Secondary | ICD-10-CM | POA: Diagnosis not present

## 2016-09-12 DIAGNOSIS — K219 Gastro-esophageal reflux disease without esophagitis: Secondary | ICD-10-CM | POA: Diagnosis not present

## 2016-09-13 DIAGNOSIS — K219 Gastro-esophageal reflux disease without esophagitis: Secondary | ICD-10-CM | POA: Diagnosis not present

## 2016-09-13 DIAGNOSIS — I10 Essential (primary) hypertension: Secondary | ICD-10-CM | POA: Diagnosis not present

## 2016-09-13 DIAGNOSIS — M5430 Sciatica, unspecified side: Secondary | ICD-10-CM | POA: Diagnosis not present

## 2016-09-14 DIAGNOSIS — K219 Gastro-esophageal reflux disease without esophagitis: Secondary | ICD-10-CM | POA: Diagnosis not present

## 2016-09-14 DIAGNOSIS — I10 Essential (primary) hypertension: Secondary | ICD-10-CM | POA: Diagnosis not present

## 2016-09-14 DIAGNOSIS — M5430 Sciatica, unspecified side: Secondary | ICD-10-CM | POA: Diagnosis not present

## 2016-09-16 DIAGNOSIS — K219 Gastro-esophageal reflux disease without esophagitis: Secondary | ICD-10-CM | POA: Diagnosis not present

## 2016-09-16 DIAGNOSIS — I10 Essential (primary) hypertension: Secondary | ICD-10-CM | POA: Diagnosis not present

## 2016-09-16 DIAGNOSIS — M5430 Sciatica, unspecified side: Secondary | ICD-10-CM | POA: Diagnosis not present

## 2016-09-17 DIAGNOSIS — K219 Gastro-esophageal reflux disease without esophagitis: Secondary | ICD-10-CM | POA: Diagnosis not present

## 2016-09-17 DIAGNOSIS — I10 Essential (primary) hypertension: Secondary | ICD-10-CM | POA: Diagnosis not present

## 2016-09-17 DIAGNOSIS — M5430 Sciatica, unspecified side: Secondary | ICD-10-CM | POA: Diagnosis not present

## 2016-09-18 DIAGNOSIS — I10 Essential (primary) hypertension: Secondary | ICD-10-CM | POA: Diagnosis not present

## 2016-09-18 DIAGNOSIS — K219 Gastro-esophageal reflux disease without esophagitis: Secondary | ICD-10-CM | POA: Diagnosis not present

## 2016-09-18 DIAGNOSIS — M5430 Sciatica, unspecified side: Secondary | ICD-10-CM | POA: Diagnosis not present

## 2016-09-19 DIAGNOSIS — I10 Essential (primary) hypertension: Secondary | ICD-10-CM | POA: Diagnosis not present

## 2016-09-19 DIAGNOSIS — K219 Gastro-esophageal reflux disease without esophagitis: Secondary | ICD-10-CM | POA: Diagnosis not present

## 2016-09-19 DIAGNOSIS — M5430 Sciatica, unspecified side: Secondary | ICD-10-CM | POA: Diagnosis not present

## 2016-09-20 DIAGNOSIS — K219 Gastro-esophageal reflux disease without esophagitis: Secondary | ICD-10-CM | POA: Diagnosis not present

## 2016-09-20 DIAGNOSIS — M5430 Sciatica, unspecified side: Secondary | ICD-10-CM | POA: Diagnosis not present

## 2016-09-20 DIAGNOSIS — I10 Essential (primary) hypertension: Secondary | ICD-10-CM | POA: Diagnosis not present

## 2016-09-21 DIAGNOSIS — M5430 Sciatica, unspecified side: Secondary | ICD-10-CM | POA: Diagnosis not present

## 2016-09-21 DIAGNOSIS — K219 Gastro-esophageal reflux disease without esophagitis: Secondary | ICD-10-CM | POA: Diagnosis not present

## 2016-09-21 DIAGNOSIS — I10 Essential (primary) hypertension: Secondary | ICD-10-CM | POA: Diagnosis not present

## 2016-09-22 ENCOUNTER — Encounter (HOSPITAL_COMMUNITY): Payer: Self-pay | Admitting: Emergency Medicine

## 2016-09-22 ENCOUNTER — Ambulatory Visit (HOSPITAL_COMMUNITY)
Admission: EM | Admit: 2016-09-22 | Discharge: 2016-09-22 | Disposition: A | Discharge status: Home / Self Care | Payer: Medicare HMO | Attending: Family Medicine | Admitting: Family Medicine

## 2016-09-22 DIAGNOSIS — J4521 Mild intermittent asthma with (acute) exacerbation: Secondary | ICD-10-CM | POA: Diagnosis not present

## 2016-09-22 HISTORY — DX: Unspecified asthma, uncomplicated: J45.909

## 2016-09-22 MED ORDER — PREDNISONE 10 MG (21) PO TBPK: 10.0000 mg | ORAL_TABLET | Freq: Every day | ORAL | 0 refills | Status: DC

## 2016-09-22 MED ORDER — ALBUTEROL SULFATE HFA 108 (90 BASE) MCG/ACT IN AERS: 1.0000 | INHALATION_SPRAY | Freq: Four times a day (QID) | RESPIRATORY_TRACT | 1 refills | Status: DC | PRN

## 2016-09-22 NOTE — ED Triage Notes (Signed)
PT reports exertional SOB for 3-4 days. PT has history of asthma and does not have an inhaler. PT reports SOB is also worse when he eats.

## 2016-09-22 NOTE — Discharge Instructions (Signed)
Recommend you establish with a primary care provider for evaluation and management of your asthma. You have been given a steroid prescription here along with a albuterol inhaler. Should your symptoms worsen or fail to resolve follow up with a PCP or return to clinic.
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
## 2016-09-23 DIAGNOSIS — I10 Essential (primary) hypertension: Secondary | ICD-10-CM | POA: Diagnosis not present

## 2016-09-23 DIAGNOSIS — M5430 Sciatica, unspecified side: Secondary | ICD-10-CM | POA: Diagnosis not present

## 2016-09-23 DIAGNOSIS — K219 Gastro-esophageal reflux disease without esophagitis: Secondary | ICD-10-CM | POA: Diagnosis not present

## 2016-09-24 DIAGNOSIS — K219 Gastro-esophageal reflux disease without esophagitis: Secondary | ICD-10-CM | POA: Diagnosis not present

## 2016-09-24 DIAGNOSIS — I10 Essential (primary) hypertension: Secondary | ICD-10-CM | POA: Diagnosis not present

## 2016-09-24 DIAGNOSIS — M5430 Sciatica, unspecified side: Secondary | ICD-10-CM | POA: Diagnosis not present

## 2016-09-25 DIAGNOSIS — M5430 Sciatica, unspecified side: Secondary | ICD-10-CM | POA: Diagnosis not present

## 2016-09-25 DIAGNOSIS — K219 Gastro-esophageal reflux disease without esophagitis: Secondary | ICD-10-CM | POA: Diagnosis not present

## 2016-09-25 DIAGNOSIS — I10 Essential (primary) hypertension: Secondary | ICD-10-CM | POA: Diagnosis not present

## 2016-09-26 DIAGNOSIS — M5430 Sciatica, unspecified side: Secondary | ICD-10-CM | POA: Diagnosis not present

## 2016-09-26 DIAGNOSIS — I10 Essential (primary) hypertension: Secondary | ICD-10-CM | POA: Diagnosis not present

## 2016-09-26 DIAGNOSIS — K219 Gastro-esophageal reflux disease without esophagitis: Secondary | ICD-10-CM | POA: Diagnosis not present

## 2016-09-27 DIAGNOSIS — K219 Gastro-esophageal reflux disease without esophagitis: Secondary | ICD-10-CM | POA: Diagnosis not present

## 2016-09-27 DIAGNOSIS — M5430 Sciatica, unspecified side: Secondary | ICD-10-CM | POA: Diagnosis not present

## 2016-09-27 DIAGNOSIS — I10 Essential (primary) hypertension: Secondary | ICD-10-CM | POA: Diagnosis not present

## 2016-09-28 DIAGNOSIS — M5430 Sciatica, unspecified side: Secondary | ICD-10-CM | POA: Diagnosis not present

## 2016-09-28 DIAGNOSIS — I10 Essential (primary) hypertension: Secondary | ICD-10-CM | POA: Diagnosis not present

## 2016-09-28 DIAGNOSIS — K219 Gastro-esophageal reflux disease without esophagitis: Secondary | ICD-10-CM | POA: Diagnosis not present

## 2016-09-29 DIAGNOSIS — M5430 Sciatica, unspecified side: Secondary | ICD-10-CM | POA: Diagnosis not present

## 2016-09-29 DIAGNOSIS — K219 Gastro-esophageal reflux disease without esophagitis: Secondary | ICD-10-CM | POA: Diagnosis not present

## 2016-09-29 DIAGNOSIS — I10 Essential (primary) hypertension: Secondary | ICD-10-CM | POA: Diagnosis not present

## 2016-09-30 DIAGNOSIS — K219 Gastro-esophageal reflux disease without esophagitis: Secondary | ICD-10-CM | POA: Diagnosis not present

## 2016-09-30 DIAGNOSIS — M5430 Sciatica, unspecified side: Secondary | ICD-10-CM | POA: Diagnosis not present

## 2016-09-30 DIAGNOSIS — I10 Essential (primary) hypertension: Secondary | ICD-10-CM | POA: Diagnosis not present

## 2016-10-01 DIAGNOSIS — I10 Essential (primary) hypertension: Secondary | ICD-10-CM | POA: Diagnosis not present

## 2016-10-01 DIAGNOSIS — K219 Gastro-esophageal reflux disease without esophagitis: Secondary | ICD-10-CM | POA: Diagnosis not present

## 2016-10-01 DIAGNOSIS — M5430 Sciatica, unspecified side: Secondary | ICD-10-CM | POA: Diagnosis not present

## 2016-10-02 DIAGNOSIS — I10 Essential (primary) hypertension: Secondary | ICD-10-CM | POA: Diagnosis not present

## 2016-10-02 DIAGNOSIS — M5430 Sciatica, unspecified side: Secondary | ICD-10-CM | POA: Diagnosis not present

## 2016-10-02 DIAGNOSIS — K219 Gastro-esophageal reflux disease without esophagitis: Secondary | ICD-10-CM | POA: Diagnosis not present

## 2016-10-03 DIAGNOSIS — K219 Gastro-esophageal reflux disease without esophagitis: Secondary | ICD-10-CM | POA: Diagnosis not present

## 2016-10-03 DIAGNOSIS — M5430 Sciatica, unspecified side: Secondary | ICD-10-CM | POA: Diagnosis not present

## 2016-10-03 DIAGNOSIS — I10 Essential (primary) hypertension: Secondary | ICD-10-CM | POA: Diagnosis not present

## 2016-10-04 DIAGNOSIS — K219 Gastro-esophageal reflux disease without esophagitis: Secondary | ICD-10-CM | POA: Diagnosis not present

## 2016-10-04 DIAGNOSIS — I10 Essential (primary) hypertension: Secondary | ICD-10-CM | POA: Diagnosis not present

## 2016-10-04 DIAGNOSIS — M5430 Sciatica, unspecified side: Secondary | ICD-10-CM | POA: Diagnosis not present

## 2016-10-05 DIAGNOSIS — I10 Essential (primary) hypertension: Secondary | ICD-10-CM | POA: Diagnosis not present

## 2016-10-05 DIAGNOSIS — K219 Gastro-esophageal reflux disease without esophagitis: Secondary | ICD-10-CM | POA: Diagnosis not present

## 2016-10-05 DIAGNOSIS — M5430 Sciatica, unspecified side: Secondary | ICD-10-CM | POA: Diagnosis not present

## 2016-10-07 DIAGNOSIS — K219 Gastro-esophageal reflux disease without esophagitis: Secondary | ICD-10-CM | POA: Diagnosis not present

## 2016-10-07 DIAGNOSIS — I10 Essential (primary) hypertension: Secondary | ICD-10-CM | POA: Diagnosis not present

## 2016-10-07 DIAGNOSIS — M5430 Sciatica, unspecified side: Secondary | ICD-10-CM | POA: Diagnosis not present

## 2016-10-08 DIAGNOSIS — K219 Gastro-esophageal reflux disease without esophagitis: Secondary | ICD-10-CM | POA: Diagnosis not present

## 2016-10-08 DIAGNOSIS — I10 Essential (primary) hypertension: Secondary | ICD-10-CM | POA: Diagnosis not present

## 2016-10-08 DIAGNOSIS — M5430 Sciatica, unspecified side: Secondary | ICD-10-CM | POA: Diagnosis not present

## 2016-10-09 DIAGNOSIS — I10 Essential (primary) hypertension: Secondary | ICD-10-CM | POA: Diagnosis not present

## 2016-10-09 DIAGNOSIS — M5430 Sciatica, unspecified side: Secondary | ICD-10-CM | POA: Diagnosis not present

## 2016-10-09 DIAGNOSIS — K219 Gastro-esophageal reflux disease without esophagitis: Secondary | ICD-10-CM | POA: Diagnosis not present

## 2016-10-10 DIAGNOSIS — I10 Essential (primary) hypertension: Secondary | ICD-10-CM | POA: Diagnosis not present

## 2016-10-10 DIAGNOSIS — M5430 Sciatica, unspecified side: Secondary | ICD-10-CM | POA: Diagnosis not present

## 2016-10-10 DIAGNOSIS — K219 Gastro-esophageal reflux disease without esophagitis: Secondary | ICD-10-CM | POA: Diagnosis not present

## 2016-10-11 DIAGNOSIS — I10 Essential (primary) hypertension: Secondary | ICD-10-CM | POA: Diagnosis not present

## 2016-10-11 DIAGNOSIS — K219 Gastro-esophageal reflux disease without esophagitis: Secondary | ICD-10-CM | POA: Diagnosis not present

## 2016-10-11 DIAGNOSIS — M5430 Sciatica, unspecified side: Secondary | ICD-10-CM | POA: Diagnosis not present

## 2016-10-12 DIAGNOSIS — M5430 Sciatica, unspecified side: Secondary | ICD-10-CM | POA: Diagnosis not present

## 2016-10-12 DIAGNOSIS — K219 Gastro-esophageal reflux disease without esophagitis: Secondary | ICD-10-CM | POA: Diagnosis not present

## 2016-10-12 DIAGNOSIS — I10 Essential (primary) hypertension: Secondary | ICD-10-CM | POA: Diagnosis not present

## 2016-10-13 DIAGNOSIS — M5430 Sciatica, unspecified side: Secondary | ICD-10-CM | POA: Diagnosis not present

## 2016-10-13 DIAGNOSIS — I10 Essential (primary) hypertension: Secondary | ICD-10-CM | POA: Diagnosis not present

## 2016-10-13 DIAGNOSIS — K219 Gastro-esophageal reflux disease without esophagitis: Secondary | ICD-10-CM | POA: Diagnosis not present

## 2016-10-14 DIAGNOSIS — I10 Essential (primary) hypertension: Secondary | ICD-10-CM | POA: Diagnosis not present

## 2016-10-14 DIAGNOSIS — K219 Gastro-esophageal reflux disease without esophagitis: Secondary | ICD-10-CM | POA: Diagnosis not present

## 2016-10-14 DIAGNOSIS — M5430 Sciatica, unspecified side: Secondary | ICD-10-CM | POA: Diagnosis not present

## 2016-10-15 DIAGNOSIS — I10 Essential (primary) hypertension: Secondary | ICD-10-CM | POA: Diagnosis not present

## 2016-10-15 DIAGNOSIS — K219 Gastro-esophageal reflux disease without esophagitis: Secondary | ICD-10-CM | POA: Diagnosis not present

## 2016-10-15 DIAGNOSIS — M5430 Sciatica, unspecified side: Secondary | ICD-10-CM | POA: Diagnosis not present

## 2016-10-16 DIAGNOSIS — I10 Essential (primary) hypertension: Secondary | ICD-10-CM | POA: Diagnosis not present

## 2016-10-16 DIAGNOSIS — M5430 Sciatica, unspecified side: Secondary | ICD-10-CM | POA: Diagnosis not present

## 2016-10-16 DIAGNOSIS — K219 Gastro-esophageal reflux disease without esophagitis: Secondary | ICD-10-CM | POA: Diagnosis not present

## 2016-10-17 DIAGNOSIS — M5430 Sciatica, unspecified side: Secondary | ICD-10-CM | POA: Diagnosis not present

## 2016-10-17 DIAGNOSIS — I10 Essential (primary) hypertension: Secondary | ICD-10-CM | POA: Diagnosis not present

## 2016-10-17 DIAGNOSIS — K219 Gastro-esophageal reflux disease without esophagitis: Secondary | ICD-10-CM | POA: Diagnosis not present

## 2016-10-18 DIAGNOSIS — I10 Essential (primary) hypertension: Secondary | ICD-10-CM | POA: Diagnosis not present

## 2016-10-18 DIAGNOSIS — M5430 Sciatica, unspecified side: Secondary | ICD-10-CM | POA: Diagnosis not present

## 2016-10-18 DIAGNOSIS — K219 Gastro-esophageal reflux disease without esophagitis: Secondary | ICD-10-CM | POA: Diagnosis not present

## 2016-10-19 DIAGNOSIS — I10 Essential (primary) hypertension: Secondary | ICD-10-CM | POA: Diagnosis not present

## 2016-10-19 DIAGNOSIS — M5430 Sciatica, unspecified side: Secondary | ICD-10-CM | POA: Diagnosis not present

## 2016-10-19 DIAGNOSIS — K219 Gastro-esophageal reflux disease without esophagitis: Secondary | ICD-10-CM | POA: Diagnosis not present

## 2016-10-20 DIAGNOSIS — K219 Gastro-esophageal reflux disease without esophagitis: Secondary | ICD-10-CM | POA: Diagnosis not present

## 2016-10-20 DIAGNOSIS — I10 Essential (primary) hypertension: Secondary | ICD-10-CM | POA: Diagnosis not present

## 2016-10-20 DIAGNOSIS — M5430 Sciatica, unspecified side: Secondary | ICD-10-CM | POA: Diagnosis not present

## 2016-10-21 DIAGNOSIS — M5430 Sciatica, unspecified side: Secondary | ICD-10-CM | POA: Diagnosis not present

## 2016-10-21 DIAGNOSIS — K219 Gastro-esophageal reflux disease without esophagitis: Secondary | ICD-10-CM | POA: Diagnosis not present

## 2016-10-21 DIAGNOSIS — I10 Essential (primary) hypertension: Secondary | ICD-10-CM | POA: Diagnosis not present

## 2016-10-22 DIAGNOSIS — M5430 Sciatica, unspecified side: Secondary | ICD-10-CM | POA: Diagnosis not present

## 2016-10-22 DIAGNOSIS — K219 Gastro-esophageal reflux disease without esophagitis: Secondary | ICD-10-CM | POA: Diagnosis not present

## 2016-10-22 DIAGNOSIS — I10 Essential (primary) hypertension: Secondary | ICD-10-CM | POA: Diagnosis not present

## 2016-10-23 DIAGNOSIS — I10 Essential (primary) hypertension: Secondary | ICD-10-CM | POA: Diagnosis not present

## 2016-10-23 DIAGNOSIS — K219 Gastro-esophageal reflux disease without esophagitis: Secondary | ICD-10-CM | POA: Diagnosis not present

## 2016-10-23 DIAGNOSIS — M5430 Sciatica, unspecified side: Secondary | ICD-10-CM | POA: Diagnosis not present

## 2016-10-24 DIAGNOSIS — M5430 Sciatica, unspecified side: Secondary | ICD-10-CM | POA: Diagnosis not present

## 2016-10-24 DIAGNOSIS — I10 Essential (primary) hypertension: Secondary | ICD-10-CM | POA: Diagnosis not present

## 2016-10-24 DIAGNOSIS — K219 Gastro-esophageal reflux disease without esophagitis: Secondary | ICD-10-CM | POA: Diagnosis not present

## 2016-10-25 DIAGNOSIS — M5430 Sciatica, unspecified side: Secondary | ICD-10-CM | POA: Diagnosis not present

## 2016-10-25 DIAGNOSIS — I10 Essential (primary) hypertension: Secondary | ICD-10-CM | POA: Diagnosis not present

## 2016-10-25 DIAGNOSIS — K219 Gastro-esophageal reflux disease without esophagitis: Secondary | ICD-10-CM | POA: Diagnosis not present

## 2016-10-26 DIAGNOSIS — K219 Gastro-esophageal reflux disease without esophagitis: Secondary | ICD-10-CM | POA: Diagnosis not present

## 2016-10-26 DIAGNOSIS — I10 Essential (primary) hypertension: Secondary | ICD-10-CM | POA: Diagnosis not present

## 2016-10-26 DIAGNOSIS — M5430 Sciatica, unspecified side: Secondary | ICD-10-CM | POA: Diagnosis not present

## 2016-10-27 DIAGNOSIS — K219 Gastro-esophageal reflux disease without esophagitis: Secondary | ICD-10-CM | POA: Diagnosis not present

## 2016-10-27 DIAGNOSIS — M5430 Sciatica, unspecified side: Secondary | ICD-10-CM | POA: Diagnosis not present

## 2016-10-27 DIAGNOSIS — I10 Essential (primary) hypertension: Secondary | ICD-10-CM | POA: Diagnosis not present

## 2016-10-28 DIAGNOSIS — K219 Gastro-esophageal reflux disease without esophagitis: Secondary | ICD-10-CM | POA: Diagnosis not present

## 2016-10-28 DIAGNOSIS — I10 Essential (primary) hypertension: Secondary | ICD-10-CM | POA: Diagnosis not present

## 2016-10-28 DIAGNOSIS — M5430 Sciatica, unspecified side: Secondary | ICD-10-CM | POA: Diagnosis not present

## 2016-10-29 DIAGNOSIS — M5430 Sciatica, unspecified side: Secondary | ICD-10-CM | POA: Diagnosis not present

## 2016-10-29 DIAGNOSIS — K219 Gastro-esophageal reflux disease without esophagitis: Secondary | ICD-10-CM | POA: Diagnosis not present

## 2016-10-29 DIAGNOSIS — I10 Essential (primary) hypertension: Secondary | ICD-10-CM | POA: Diagnosis not present

## 2016-10-30 DIAGNOSIS — K219 Gastro-esophageal reflux disease without esophagitis: Secondary | ICD-10-CM | POA: Diagnosis not present

## 2016-10-30 DIAGNOSIS — M5430 Sciatica, unspecified side: Secondary | ICD-10-CM | POA: Diagnosis not present

## 2016-10-30 DIAGNOSIS — I10 Essential (primary) hypertension: Secondary | ICD-10-CM | POA: Diagnosis not present

## 2016-10-31 DIAGNOSIS — K219 Gastro-esophageal reflux disease without esophagitis: Secondary | ICD-10-CM | POA: Diagnosis not present

## 2016-10-31 DIAGNOSIS — M5430 Sciatica, unspecified side: Secondary | ICD-10-CM | POA: Diagnosis not present

## 2016-10-31 DIAGNOSIS — I10 Essential (primary) hypertension: Secondary | ICD-10-CM | POA: Diagnosis not present

## 2016-11-01 DIAGNOSIS — K219 Gastro-esophageal reflux disease without esophagitis: Secondary | ICD-10-CM | POA: Diagnosis not present

## 2016-11-01 DIAGNOSIS — I10 Essential (primary) hypertension: Secondary | ICD-10-CM | POA: Diagnosis not present

## 2016-11-01 DIAGNOSIS — M5430 Sciatica, unspecified side: Secondary | ICD-10-CM | POA: Diagnosis not present

## 2016-11-02 DIAGNOSIS — M5430 Sciatica, unspecified side: Secondary | ICD-10-CM | POA: Diagnosis not present

## 2016-11-02 DIAGNOSIS — K219 Gastro-esophageal reflux disease without esophagitis: Secondary | ICD-10-CM | POA: Diagnosis not present

## 2016-11-02 DIAGNOSIS — I10 Essential (primary) hypertension: Secondary | ICD-10-CM | POA: Diagnosis not present

## 2016-11-03 DIAGNOSIS — K219 Gastro-esophageal reflux disease without esophagitis: Secondary | ICD-10-CM | POA: Diagnosis not present

## 2016-11-03 DIAGNOSIS — M5430 Sciatica, unspecified side: Secondary | ICD-10-CM | POA: Diagnosis not present

## 2016-11-03 DIAGNOSIS — I10 Essential (primary) hypertension: Secondary | ICD-10-CM | POA: Diagnosis not present

## 2016-11-04 DIAGNOSIS — M5430 Sciatica, unspecified side: Secondary | ICD-10-CM | POA: Diagnosis not present

## 2016-11-04 DIAGNOSIS — K219 Gastro-esophageal reflux disease without esophagitis: Secondary | ICD-10-CM | POA: Diagnosis not present

## 2016-11-04 DIAGNOSIS — I10 Essential (primary) hypertension: Secondary | ICD-10-CM | POA: Diagnosis not present

## 2016-11-05 DIAGNOSIS — I10 Essential (primary) hypertension: Secondary | ICD-10-CM | POA: Diagnosis not present

## 2016-11-05 DIAGNOSIS — M5430 Sciatica, unspecified side: Secondary | ICD-10-CM | POA: Diagnosis not present

## 2016-11-05 DIAGNOSIS — K219 Gastro-esophageal reflux disease without esophagitis: Secondary | ICD-10-CM | POA: Diagnosis not present

## 2016-11-06 DIAGNOSIS — I10 Essential (primary) hypertension: Secondary | ICD-10-CM | POA: Diagnosis not present

## 2016-11-06 DIAGNOSIS — M5430 Sciatica, unspecified side: Secondary | ICD-10-CM | POA: Diagnosis not present

## 2016-11-06 DIAGNOSIS — K219 Gastro-esophageal reflux disease without esophagitis: Secondary | ICD-10-CM | POA: Diagnosis not present

## 2016-11-07 DIAGNOSIS — M5430 Sciatica, unspecified side: Secondary | ICD-10-CM | POA: Diagnosis not present

## 2016-11-07 DIAGNOSIS — K219 Gastro-esophageal reflux disease without esophagitis: Secondary | ICD-10-CM | POA: Diagnosis not present

## 2016-11-07 DIAGNOSIS — I10 Essential (primary) hypertension: Secondary | ICD-10-CM | POA: Diagnosis not present

## 2016-11-08 DIAGNOSIS — I10 Essential (primary) hypertension: Secondary | ICD-10-CM | POA: Diagnosis not present

## 2016-11-08 DIAGNOSIS — M5430 Sciatica, unspecified side: Secondary | ICD-10-CM | POA: Diagnosis not present

## 2016-11-08 DIAGNOSIS — K219 Gastro-esophageal reflux disease without esophagitis: Secondary | ICD-10-CM | POA: Diagnosis not present

## 2016-11-09 DIAGNOSIS — K219 Gastro-esophageal reflux disease without esophagitis: Secondary | ICD-10-CM | POA: Diagnosis not present

## 2016-11-09 DIAGNOSIS — M5430 Sciatica, unspecified side: Secondary | ICD-10-CM | POA: Diagnosis not present

## 2016-11-09 DIAGNOSIS — I10 Essential (primary) hypertension: Secondary | ICD-10-CM | POA: Diagnosis not present

## 2016-11-10 DIAGNOSIS — M5430 Sciatica, unspecified side: Secondary | ICD-10-CM | POA: Diagnosis not present

## 2016-11-10 DIAGNOSIS — I10 Essential (primary) hypertension: Secondary | ICD-10-CM | POA: Diagnosis not present

## 2016-11-10 DIAGNOSIS — K219 Gastro-esophageal reflux disease without esophagitis: Secondary | ICD-10-CM | POA: Diagnosis not present

## 2016-11-11 DIAGNOSIS — M5430 Sciatica, unspecified side: Secondary | ICD-10-CM | POA: Diagnosis not present

## 2016-11-11 DIAGNOSIS — I10 Essential (primary) hypertension: Secondary | ICD-10-CM | POA: Diagnosis not present

## 2016-11-11 DIAGNOSIS — K219 Gastro-esophageal reflux disease without esophagitis: Secondary | ICD-10-CM | POA: Diagnosis not present

## 2016-11-12 DIAGNOSIS — K219 Gastro-esophageal reflux disease without esophagitis: Secondary | ICD-10-CM | POA: Diagnosis not present

## 2016-11-12 DIAGNOSIS — I10 Essential (primary) hypertension: Secondary | ICD-10-CM | POA: Diagnosis not present

## 2016-11-12 DIAGNOSIS — M5430 Sciatica, unspecified side: Secondary | ICD-10-CM | POA: Diagnosis not present

## 2016-11-13 DIAGNOSIS — K219 Gastro-esophageal reflux disease without esophagitis: Secondary | ICD-10-CM | POA: Diagnosis not present

## 2016-11-13 DIAGNOSIS — M5430 Sciatica, unspecified side: Secondary | ICD-10-CM | POA: Diagnosis not present

## 2016-11-13 DIAGNOSIS — I10 Essential (primary) hypertension: Secondary | ICD-10-CM | POA: Diagnosis not present

## 2016-11-14 DIAGNOSIS — K219 Gastro-esophageal reflux disease without esophagitis: Secondary | ICD-10-CM | POA: Diagnosis not present

## 2016-11-14 DIAGNOSIS — M5430 Sciatica, unspecified side: Secondary | ICD-10-CM | POA: Diagnosis not present

## 2016-11-14 DIAGNOSIS — I10 Essential (primary) hypertension: Secondary | ICD-10-CM | POA: Diagnosis not present

## 2016-11-15 DIAGNOSIS — I10 Essential (primary) hypertension: Secondary | ICD-10-CM | POA: Diagnosis not present

## 2016-11-15 DIAGNOSIS — M5430 Sciatica, unspecified side: Secondary | ICD-10-CM | POA: Diagnosis not present

## 2016-11-15 DIAGNOSIS — K219 Gastro-esophageal reflux disease without esophagitis: Secondary | ICD-10-CM | POA: Diagnosis not present

## 2016-11-16 DIAGNOSIS — I10 Essential (primary) hypertension: Secondary | ICD-10-CM | POA: Diagnosis not present

## 2016-11-16 DIAGNOSIS — K219 Gastro-esophageal reflux disease without esophagitis: Secondary | ICD-10-CM | POA: Diagnosis not present

## 2016-11-16 DIAGNOSIS — M5430 Sciatica, unspecified side: Secondary | ICD-10-CM | POA: Diagnosis not present

## 2016-11-17 DIAGNOSIS — I10 Essential (primary) hypertension: Secondary | ICD-10-CM | POA: Diagnosis not present

## 2016-11-17 DIAGNOSIS — M5430 Sciatica, unspecified side: Secondary | ICD-10-CM | POA: Diagnosis not present

## 2016-11-17 DIAGNOSIS — K219 Gastro-esophageal reflux disease without esophagitis: Secondary | ICD-10-CM | POA: Diagnosis not present

## 2016-11-18 DIAGNOSIS — K219 Gastro-esophageal reflux disease without esophagitis: Secondary | ICD-10-CM | POA: Diagnosis not present

## 2016-11-18 DIAGNOSIS — I10 Essential (primary) hypertension: Secondary | ICD-10-CM | POA: Diagnosis not present

## 2016-11-18 DIAGNOSIS — M5430 Sciatica, unspecified side: Secondary | ICD-10-CM | POA: Diagnosis not present

## 2016-11-19 DIAGNOSIS — I10 Essential (primary) hypertension: Secondary | ICD-10-CM | POA: Diagnosis not present

## 2016-11-19 DIAGNOSIS — K219 Gastro-esophageal reflux disease without esophagitis: Secondary | ICD-10-CM | POA: Diagnosis not present

## 2016-11-19 DIAGNOSIS — M5430 Sciatica, unspecified side: Secondary | ICD-10-CM | POA: Diagnosis not present

## 2016-11-20 DIAGNOSIS — K219 Gastro-esophageal reflux disease without esophagitis: Secondary | ICD-10-CM | POA: Diagnosis not present

## 2016-11-20 DIAGNOSIS — M5430 Sciatica, unspecified side: Secondary | ICD-10-CM | POA: Diagnosis not present

## 2016-11-20 DIAGNOSIS — I10 Essential (primary) hypertension: Secondary | ICD-10-CM | POA: Diagnosis not present

## 2016-11-21 DIAGNOSIS — K219 Gastro-esophageal reflux disease without esophagitis: Secondary | ICD-10-CM | POA: Diagnosis not present

## 2016-11-21 DIAGNOSIS — I10 Essential (primary) hypertension: Secondary | ICD-10-CM | POA: Diagnosis not present

## 2016-11-21 DIAGNOSIS — M5430 Sciatica, unspecified side: Secondary | ICD-10-CM | POA: Diagnosis not present

## 2016-11-22 DIAGNOSIS — M5430 Sciatica, unspecified side: Secondary | ICD-10-CM | POA: Diagnosis not present

## 2016-11-22 DIAGNOSIS — I10 Essential (primary) hypertension: Secondary | ICD-10-CM | POA: Diagnosis not present

## 2016-11-22 DIAGNOSIS — K219 Gastro-esophageal reflux disease without esophagitis: Secondary | ICD-10-CM | POA: Diagnosis not present

## 2016-11-23 DIAGNOSIS — M5430 Sciatica, unspecified side: Secondary | ICD-10-CM | POA: Diagnosis not present

## 2016-11-23 DIAGNOSIS — K219 Gastro-esophageal reflux disease without esophagitis: Secondary | ICD-10-CM | POA: Diagnosis not present

## 2016-11-23 DIAGNOSIS — I10 Essential (primary) hypertension: Secondary | ICD-10-CM | POA: Diagnosis not present

## 2016-11-24 DIAGNOSIS — M5430 Sciatica, unspecified side: Secondary | ICD-10-CM | POA: Diagnosis not present

## 2016-11-24 DIAGNOSIS — I10 Essential (primary) hypertension: Secondary | ICD-10-CM | POA: Diagnosis not present

## 2016-11-24 DIAGNOSIS — K219 Gastro-esophageal reflux disease without esophagitis: Secondary | ICD-10-CM | POA: Diagnosis not present

## 2016-11-25 DIAGNOSIS — I10 Essential (primary) hypertension: Secondary | ICD-10-CM | POA: Diagnosis not present

## 2016-11-25 DIAGNOSIS — M5430 Sciatica, unspecified side: Secondary | ICD-10-CM | POA: Diagnosis not present

## 2016-11-25 DIAGNOSIS — K219 Gastro-esophageal reflux disease without esophagitis: Secondary | ICD-10-CM | POA: Diagnosis not present

## 2016-11-26 DIAGNOSIS — I10 Essential (primary) hypertension: Secondary | ICD-10-CM | POA: Diagnosis not present

## 2016-11-26 DIAGNOSIS — M5430 Sciatica, unspecified side: Secondary | ICD-10-CM | POA: Diagnosis not present

## 2016-11-26 DIAGNOSIS — K219 Gastro-esophageal reflux disease without esophagitis: Secondary | ICD-10-CM | POA: Diagnosis not present

## 2016-11-27 DIAGNOSIS — K219 Gastro-esophageal reflux disease without esophagitis: Secondary | ICD-10-CM | POA: Diagnosis not present

## 2016-11-27 DIAGNOSIS — M5430 Sciatica, unspecified side: Secondary | ICD-10-CM | POA: Diagnosis not present

## 2016-11-27 DIAGNOSIS — I10 Essential (primary) hypertension: Secondary | ICD-10-CM | POA: Diagnosis not present

## 2016-11-28 DIAGNOSIS — K219 Gastro-esophageal reflux disease without esophagitis: Secondary | ICD-10-CM | POA: Diagnosis not present

## 2016-11-28 DIAGNOSIS — M5430 Sciatica, unspecified side: Secondary | ICD-10-CM | POA: Diagnosis not present

## 2016-11-28 DIAGNOSIS — I10 Essential (primary) hypertension: Secondary | ICD-10-CM | POA: Diagnosis not present

## 2016-11-29 DIAGNOSIS — K219 Gastro-esophageal reflux disease without esophagitis: Secondary | ICD-10-CM | POA: Diagnosis not present

## 2016-11-29 DIAGNOSIS — I10 Essential (primary) hypertension: Secondary | ICD-10-CM | POA: Diagnosis not present

## 2016-11-29 DIAGNOSIS — M5430 Sciatica, unspecified side: Secondary | ICD-10-CM | POA: Diagnosis not present

## 2016-11-30 DIAGNOSIS — I10 Essential (primary) hypertension: Secondary | ICD-10-CM | POA: Diagnosis not present

## 2016-11-30 DIAGNOSIS — K219 Gastro-esophageal reflux disease without esophagitis: Secondary | ICD-10-CM | POA: Diagnosis not present

## 2016-11-30 DIAGNOSIS — M5430 Sciatica, unspecified side: Secondary | ICD-10-CM | POA: Diagnosis not present

## 2016-12-01 DIAGNOSIS — K219 Gastro-esophageal reflux disease without esophagitis: Secondary | ICD-10-CM | POA: Diagnosis not present

## 2016-12-01 DIAGNOSIS — I10 Essential (primary) hypertension: Secondary | ICD-10-CM | POA: Diagnosis not present

## 2016-12-01 DIAGNOSIS — M5430 Sciatica, unspecified side: Secondary | ICD-10-CM | POA: Diagnosis not present

## 2016-12-02 DIAGNOSIS — K219 Gastro-esophageal reflux disease without esophagitis: Secondary | ICD-10-CM | POA: Diagnosis not present

## 2016-12-02 DIAGNOSIS — M5430 Sciatica, unspecified side: Secondary | ICD-10-CM | POA: Diagnosis not present

## 2016-12-02 DIAGNOSIS — I10 Essential (primary) hypertension: Secondary | ICD-10-CM | POA: Diagnosis not present

## 2016-12-03 DIAGNOSIS — K219 Gastro-esophageal reflux disease without esophagitis: Secondary | ICD-10-CM | POA: Diagnosis not present

## 2016-12-03 DIAGNOSIS — M5430 Sciatica, unspecified side: Secondary | ICD-10-CM | POA: Diagnosis not present

## 2016-12-03 DIAGNOSIS — I10 Essential (primary) hypertension: Secondary | ICD-10-CM | POA: Diagnosis not present

## 2016-12-04 DIAGNOSIS — I10 Essential (primary) hypertension: Secondary | ICD-10-CM | POA: Diagnosis not present

## 2016-12-04 DIAGNOSIS — K219 Gastro-esophageal reflux disease without esophagitis: Secondary | ICD-10-CM | POA: Diagnosis not present

## 2016-12-04 DIAGNOSIS — M5430 Sciatica, unspecified side: Secondary | ICD-10-CM | POA: Diagnosis not present

## 2016-12-05 DIAGNOSIS — M5430 Sciatica, unspecified side: Secondary | ICD-10-CM | POA: Diagnosis not present

## 2016-12-05 DIAGNOSIS — I10 Essential (primary) hypertension: Secondary | ICD-10-CM | POA: Diagnosis not present

## 2016-12-05 DIAGNOSIS — K219 Gastro-esophageal reflux disease without esophagitis: Secondary | ICD-10-CM | POA: Diagnosis not present

## 2016-12-06 DIAGNOSIS — K219 Gastro-esophageal reflux disease without esophagitis: Secondary | ICD-10-CM | POA: Diagnosis not present

## 2016-12-06 DIAGNOSIS — I10 Essential (primary) hypertension: Secondary | ICD-10-CM | POA: Diagnosis not present

## 2016-12-06 DIAGNOSIS — M5430 Sciatica, unspecified side: Secondary | ICD-10-CM | POA: Diagnosis not present

## 2016-12-07 DIAGNOSIS — M5430 Sciatica, unspecified side: Secondary | ICD-10-CM | POA: Diagnosis not present

## 2016-12-07 DIAGNOSIS — I10 Essential (primary) hypertension: Secondary | ICD-10-CM | POA: Diagnosis not present

## 2016-12-07 DIAGNOSIS — K219 Gastro-esophageal reflux disease without esophagitis: Secondary | ICD-10-CM | POA: Diagnosis not present

## 2016-12-08 DIAGNOSIS — M5430 Sciatica, unspecified side: Secondary | ICD-10-CM | POA: Diagnosis not present

## 2016-12-08 DIAGNOSIS — K219 Gastro-esophageal reflux disease without esophagitis: Secondary | ICD-10-CM | POA: Diagnosis not present

## 2016-12-08 DIAGNOSIS — I10 Essential (primary) hypertension: Secondary | ICD-10-CM | POA: Diagnosis not present

## 2016-12-09 DIAGNOSIS — I10 Essential (primary) hypertension: Secondary | ICD-10-CM | POA: Diagnosis not present

## 2016-12-09 DIAGNOSIS — K219 Gastro-esophageal reflux disease without esophagitis: Secondary | ICD-10-CM | POA: Diagnosis not present

## 2016-12-09 DIAGNOSIS — M5430 Sciatica, unspecified side: Secondary | ICD-10-CM | POA: Diagnosis not present

## 2016-12-10 DIAGNOSIS — K219 Gastro-esophageal reflux disease without esophagitis: Secondary | ICD-10-CM | POA: Diagnosis not present

## 2016-12-10 DIAGNOSIS — M5430 Sciatica, unspecified side: Secondary | ICD-10-CM | POA: Diagnosis not present

## 2016-12-10 DIAGNOSIS — I10 Essential (primary) hypertension: Secondary | ICD-10-CM | POA: Diagnosis not present

## 2016-12-11 DIAGNOSIS — I10 Essential (primary) hypertension: Secondary | ICD-10-CM | POA: Diagnosis not present

## 2016-12-11 DIAGNOSIS — M5430 Sciatica, unspecified side: Secondary | ICD-10-CM | POA: Diagnosis not present

## 2016-12-11 DIAGNOSIS — K219 Gastro-esophageal reflux disease without esophagitis: Secondary | ICD-10-CM | POA: Diagnosis not present

## 2016-12-12 DIAGNOSIS — M5430 Sciatica, unspecified side: Secondary | ICD-10-CM | POA: Diagnosis not present

## 2016-12-12 DIAGNOSIS — I10 Essential (primary) hypertension: Secondary | ICD-10-CM | POA: Diagnosis not present

## 2016-12-12 DIAGNOSIS — K219 Gastro-esophageal reflux disease without esophagitis: Secondary | ICD-10-CM | POA: Diagnosis not present

## 2016-12-13 DIAGNOSIS — K219 Gastro-esophageal reflux disease without esophagitis: Secondary | ICD-10-CM | POA: Diagnosis not present

## 2016-12-13 DIAGNOSIS — M5430 Sciatica, unspecified side: Secondary | ICD-10-CM | POA: Diagnosis not present

## 2016-12-13 DIAGNOSIS — I10 Essential (primary) hypertension: Secondary | ICD-10-CM | POA: Diagnosis not present

## 2016-12-14 DIAGNOSIS — K219 Gastro-esophageal reflux disease without esophagitis: Secondary | ICD-10-CM | POA: Diagnosis not present

## 2016-12-14 DIAGNOSIS — M5430 Sciatica, unspecified side: Secondary | ICD-10-CM | POA: Diagnosis not present

## 2016-12-14 DIAGNOSIS — I10 Essential (primary) hypertension: Secondary | ICD-10-CM | POA: Diagnosis not present

## 2016-12-15 DIAGNOSIS — M5430 Sciatica, unspecified side: Secondary | ICD-10-CM | POA: Diagnosis not present

## 2016-12-15 DIAGNOSIS — I10 Essential (primary) hypertension: Secondary | ICD-10-CM | POA: Diagnosis not present

## 2016-12-15 DIAGNOSIS — K219 Gastro-esophageal reflux disease without esophagitis: Secondary | ICD-10-CM | POA: Diagnosis not present

## 2016-12-16 DIAGNOSIS — M5430 Sciatica, unspecified side: Secondary | ICD-10-CM | POA: Diagnosis not present

## 2016-12-16 DIAGNOSIS — K219 Gastro-esophageal reflux disease without esophagitis: Secondary | ICD-10-CM | POA: Diagnosis not present

## 2016-12-16 DIAGNOSIS — I10 Essential (primary) hypertension: Secondary | ICD-10-CM | POA: Diagnosis not present

## 2016-12-17 DIAGNOSIS — K219 Gastro-esophageal reflux disease without esophagitis: Secondary | ICD-10-CM | POA: Diagnosis not present

## 2016-12-17 DIAGNOSIS — M5430 Sciatica, unspecified side: Secondary | ICD-10-CM | POA: Diagnosis not present

## 2016-12-17 DIAGNOSIS — I10 Essential (primary) hypertension: Secondary | ICD-10-CM | POA: Diagnosis not present

## 2016-12-18 DIAGNOSIS — I10 Essential (primary) hypertension: Secondary | ICD-10-CM | POA: Diagnosis not present

## 2016-12-18 DIAGNOSIS — K219 Gastro-esophageal reflux disease without esophagitis: Secondary | ICD-10-CM | POA: Diagnosis not present

## 2016-12-18 DIAGNOSIS — M5430 Sciatica, unspecified side: Secondary | ICD-10-CM | POA: Diagnosis not present

## 2016-12-20 DIAGNOSIS — I10 Essential (primary) hypertension: Secondary | ICD-10-CM | POA: Diagnosis not present

## 2016-12-20 DIAGNOSIS — K219 Gastro-esophageal reflux disease without esophagitis: Secondary | ICD-10-CM | POA: Diagnosis not present

## 2016-12-20 DIAGNOSIS — M5430 Sciatica, unspecified side: Secondary | ICD-10-CM | POA: Diagnosis not present

## 2016-12-21 DIAGNOSIS — I10 Essential (primary) hypertension: Secondary | ICD-10-CM | POA: Diagnosis not present

## 2016-12-21 DIAGNOSIS — K219 Gastro-esophageal reflux disease without esophagitis: Secondary | ICD-10-CM | POA: Diagnosis not present

## 2016-12-21 DIAGNOSIS — M5430 Sciatica, unspecified side: Secondary | ICD-10-CM | POA: Diagnosis not present

## 2016-12-22 DIAGNOSIS — K219 Gastro-esophageal reflux disease without esophagitis: Secondary | ICD-10-CM | POA: Diagnosis not present

## 2016-12-22 DIAGNOSIS — I10 Essential (primary) hypertension: Secondary | ICD-10-CM | POA: Diagnosis not present

## 2016-12-22 DIAGNOSIS — M5430 Sciatica, unspecified side: Secondary | ICD-10-CM | POA: Diagnosis not present

## 2016-12-23 DIAGNOSIS — I10 Essential (primary) hypertension: Secondary | ICD-10-CM | POA: Diagnosis not present

## 2016-12-23 DIAGNOSIS — K219 Gastro-esophageal reflux disease without esophagitis: Secondary | ICD-10-CM | POA: Diagnosis not present

## 2016-12-23 DIAGNOSIS — M5430 Sciatica, unspecified side: Secondary | ICD-10-CM | POA: Diagnosis not present

## 2016-12-24 DIAGNOSIS — K219 Gastro-esophageal reflux disease without esophagitis: Secondary | ICD-10-CM | POA: Diagnosis not present

## 2016-12-24 DIAGNOSIS — I10 Essential (primary) hypertension: Secondary | ICD-10-CM | POA: Diagnosis not present

## 2016-12-24 DIAGNOSIS — M5430 Sciatica, unspecified side: Secondary | ICD-10-CM | POA: Diagnosis not present

## 2016-12-25 DIAGNOSIS — M5430 Sciatica, unspecified side: Secondary | ICD-10-CM | POA: Diagnosis not present

## 2016-12-25 DIAGNOSIS — I10 Essential (primary) hypertension: Secondary | ICD-10-CM | POA: Diagnosis not present

## 2016-12-25 DIAGNOSIS — K219 Gastro-esophageal reflux disease without esophagitis: Secondary | ICD-10-CM | POA: Diagnosis not present

## 2016-12-26 DIAGNOSIS — M5430 Sciatica, unspecified side: Secondary | ICD-10-CM | POA: Diagnosis not present

## 2016-12-26 DIAGNOSIS — K219 Gastro-esophageal reflux disease without esophagitis: Secondary | ICD-10-CM | POA: Diagnosis not present

## 2016-12-26 DIAGNOSIS — I10 Essential (primary) hypertension: Secondary | ICD-10-CM | POA: Diagnosis not present

## 2016-12-27 DIAGNOSIS — K219 Gastro-esophageal reflux disease without esophagitis: Secondary | ICD-10-CM | POA: Diagnosis not present

## 2016-12-27 DIAGNOSIS — I10 Essential (primary) hypertension: Secondary | ICD-10-CM | POA: Diagnosis not present

## 2016-12-27 DIAGNOSIS — M5430 Sciatica, unspecified side: Secondary | ICD-10-CM | POA: Diagnosis not present

## 2016-12-28 DIAGNOSIS — M5430 Sciatica, unspecified side: Secondary | ICD-10-CM | POA: Diagnosis not present

## 2016-12-28 DIAGNOSIS — K219 Gastro-esophageal reflux disease without esophagitis: Secondary | ICD-10-CM | POA: Diagnosis not present

## 2016-12-28 DIAGNOSIS — I10 Essential (primary) hypertension: Secondary | ICD-10-CM | POA: Diagnosis not present

## 2016-12-29 DIAGNOSIS — M5430 Sciatica, unspecified side: Secondary | ICD-10-CM | POA: Diagnosis not present

## 2016-12-29 DIAGNOSIS — K219 Gastro-esophageal reflux disease without esophagitis: Secondary | ICD-10-CM | POA: Diagnosis not present

## 2016-12-29 DIAGNOSIS — I10 Essential (primary) hypertension: Secondary | ICD-10-CM | POA: Diagnosis not present

## 2016-12-30 DIAGNOSIS — K219 Gastro-esophageal reflux disease without esophagitis: Secondary | ICD-10-CM | POA: Diagnosis not present

## 2016-12-30 DIAGNOSIS — M5430 Sciatica, unspecified side: Secondary | ICD-10-CM | POA: Diagnosis not present

## 2016-12-30 DIAGNOSIS — I10 Essential (primary) hypertension: Secondary | ICD-10-CM | POA: Diagnosis not present

## 2016-12-31 DIAGNOSIS — K219 Gastro-esophageal reflux disease without esophagitis: Secondary | ICD-10-CM | POA: Diagnosis not present

## 2016-12-31 DIAGNOSIS — I10 Essential (primary) hypertension: Secondary | ICD-10-CM | POA: Diagnosis not present

## 2016-12-31 DIAGNOSIS — M5430 Sciatica, unspecified side: Secondary | ICD-10-CM | POA: Diagnosis not present

## 2017-01-01 DIAGNOSIS — M5430 Sciatica, unspecified side: Secondary | ICD-10-CM | POA: Diagnosis not present

## 2017-01-01 DIAGNOSIS — I10 Essential (primary) hypertension: Secondary | ICD-10-CM | POA: Diagnosis not present

## 2017-01-01 DIAGNOSIS — K219 Gastro-esophageal reflux disease without esophagitis: Secondary | ICD-10-CM | POA: Diagnosis not present

## 2017-01-02 DIAGNOSIS — M5430 Sciatica, unspecified side: Secondary | ICD-10-CM | POA: Diagnosis not present

## 2017-01-02 DIAGNOSIS — K219 Gastro-esophageal reflux disease without esophagitis: Secondary | ICD-10-CM | POA: Diagnosis not present

## 2017-01-02 DIAGNOSIS — I10 Essential (primary) hypertension: Secondary | ICD-10-CM | POA: Diagnosis not present

## 2017-01-03 DIAGNOSIS — K219 Gastro-esophageal reflux disease without esophagitis: Secondary | ICD-10-CM | POA: Diagnosis not present

## 2017-01-03 DIAGNOSIS — I10 Essential (primary) hypertension: Secondary | ICD-10-CM | POA: Diagnosis not present

## 2017-01-03 DIAGNOSIS — M5430 Sciatica, unspecified side: Secondary | ICD-10-CM | POA: Diagnosis not present

## 2017-01-04 DIAGNOSIS — M5430 Sciatica, unspecified side: Secondary | ICD-10-CM | POA: Diagnosis not present

## 2017-01-04 DIAGNOSIS — K219 Gastro-esophageal reflux disease without esophagitis: Secondary | ICD-10-CM | POA: Diagnosis not present

## 2017-01-04 DIAGNOSIS — I10 Essential (primary) hypertension: Secondary | ICD-10-CM | POA: Diagnosis not present

## 2017-01-05 DIAGNOSIS — M5430 Sciatica, unspecified side: Secondary | ICD-10-CM | POA: Diagnosis not present

## 2017-01-05 DIAGNOSIS — I10 Essential (primary) hypertension: Secondary | ICD-10-CM | POA: Diagnosis not present

## 2017-01-05 DIAGNOSIS — K219 Gastro-esophageal reflux disease without esophagitis: Secondary | ICD-10-CM | POA: Diagnosis not present

## 2017-01-06 DIAGNOSIS — K219 Gastro-esophageal reflux disease without esophagitis: Secondary | ICD-10-CM | POA: Diagnosis not present

## 2017-01-06 DIAGNOSIS — M5430 Sciatica, unspecified side: Secondary | ICD-10-CM | POA: Diagnosis not present

## 2017-01-06 DIAGNOSIS — I10 Essential (primary) hypertension: Secondary | ICD-10-CM | POA: Diagnosis not present

## 2017-01-07 DIAGNOSIS — K219 Gastro-esophageal reflux disease without esophagitis: Secondary | ICD-10-CM | POA: Diagnosis not present

## 2017-01-07 DIAGNOSIS — M5430 Sciatica, unspecified side: Secondary | ICD-10-CM | POA: Diagnosis not present

## 2017-01-07 DIAGNOSIS — I10 Essential (primary) hypertension: Secondary | ICD-10-CM | POA: Diagnosis not present

## 2017-01-08 DIAGNOSIS — I10 Essential (primary) hypertension: Secondary | ICD-10-CM | POA: Diagnosis not present

## 2017-01-08 DIAGNOSIS — K219 Gastro-esophageal reflux disease without esophagitis: Secondary | ICD-10-CM | POA: Diagnosis not present

## 2017-01-08 DIAGNOSIS — M5430 Sciatica, unspecified side: Secondary | ICD-10-CM | POA: Diagnosis not present

## 2017-01-09 DIAGNOSIS — I10 Essential (primary) hypertension: Secondary | ICD-10-CM | POA: Diagnosis not present

## 2017-01-09 DIAGNOSIS — M5430 Sciatica, unspecified side: Secondary | ICD-10-CM | POA: Diagnosis not present

## 2017-01-09 DIAGNOSIS — K219 Gastro-esophageal reflux disease without esophagitis: Secondary | ICD-10-CM | POA: Diagnosis not present

## 2017-01-10 DIAGNOSIS — K219 Gastro-esophageal reflux disease without esophagitis: Secondary | ICD-10-CM | POA: Diagnosis not present

## 2017-01-10 DIAGNOSIS — I10 Essential (primary) hypertension: Secondary | ICD-10-CM | POA: Diagnosis not present

## 2017-01-10 DIAGNOSIS — M5430 Sciatica, unspecified side: Secondary | ICD-10-CM | POA: Diagnosis not present

## 2017-01-11 DIAGNOSIS — K219 Gastro-esophageal reflux disease without esophagitis: Secondary | ICD-10-CM | POA: Diagnosis not present

## 2017-01-11 DIAGNOSIS — I10 Essential (primary) hypertension: Secondary | ICD-10-CM | POA: Diagnosis not present

## 2017-01-11 DIAGNOSIS — M5430 Sciatica, unspecified side: Secondary | ICD-10-CM | POA: Diagnosis not present

## 2017-01-12 DIAGNOSIS — M5430 Sciatica, unspecified side: Secondary | ICD-10-CM | POA: Diagnosis not present

## 2017-01-12 DIAGNOSIS — K219 Gastro-esophageal reflux disease without esophagitis: Secondary | ICD-10-CM | POA: Diagnosis not present

## 2017-01-12 DIAGNOSIS — I10 Essential (primary) hypertension: Secondary | ICD-10-CM | POA: Diagnosis not present

## 2017-01-13 DIAGNOSIS — K219 Gastro-esophageal reflux disease without esophagitis: Secondary | ICD-10-CM | POA: Diagnosis not present

## 2017-01-13 DIAGNOSIS — M5430 Sciatica, unspecified side: Secondary | ICD-10-CM | POA: Diagnosis not present

## 2017-01-13 DIAGNOSIS — I10 Essential (primary) hypertension: Secondary | ICD-10-CM | POA: Diagnosis not present

## 2017-01-14 DIAGNOSIS — K219 Gastro-esophageal reflux disease without esophagitis: Secondary | ICD-10-CM | POA: Diagnosis not present

## 2017-01-14 DIAGNOSIS — I10 Essential (primary) hypertension: Secondary | ICD-10-CM | POA: Diagnosis not present

## 2017-01-14 DIAGNOSIS — M5430 Sciatica, unspecified side: Secondary | ICD-10-CM | POA: Diagnosis not present

## 2017-01-15 DIAGNOSIS — K219 Gastro-esophageal reflux disease without esophagitis: Secondary | ICD-10-CM | POA: Diagnosis not present

## 2017-01-15 DIAGNOSIS — I10 Essential (primary) hypertension: Secondary | ICD-10-CM | POA: Diagnosis not present

## 2017-01-15 DIAGNOSIS — M5430 Sciatica, unspecified side: Secondary | ICD-10-CM | POA: Diagnosis not present

## 2017-01-16 DIAGNOSIS — M5430 Sciatica, unspecified side: Secondary | ICD-10-CM | POA: Diagnosis not present

## 2017-01-16 DIAGNOSIS — I10 Essential (primary) hypertension: Secondary | ICD-10-CM | POA: Diagnosis not present

## 2017-01-16 DIAGNOSIS — K219 Gastro-esophageal reflux disease without esophagitis: Secondary | ICD-10-CM | POA: Diagnosis not present

## 2017-01-17 DIAGNOSIS — I10 Essential (primary) hypertension: Secondary | ICD-10-CM | POA: Diagnosis not present

## 2017-01-17 DIAGNOSIS — K219 Gastro-esophageal reflux disease without esophagitis: Secondary | ICD-10-CM | POA: Diagnosis not present

## 2017-01-17 DIAGNOSIS — M5430 Sciatica, unspecified side: Secondary | ICD-10-CM | POA: Diagnosis not present

## 2017-01-18 DIAGNOSIS — M5430 Sciatica, unspecified side: Secondary | ICD-10-CM | POA: Diagnosis not present

## 2017-01-18 DIAGNOSIS — I10 Essential (primary) hypertension: Secondary | ICD-10-CM | POA: Diagnosis not present

## 2017-01-18 DIAGNOSIS — K219 Gastro-esophageal reflux disease without esophagitis: Secondary | ICD-10-CM | POA: Diagnosis not present

## 2017-01-19 DIAGNOSIS — M5430 Sciatica, unspecified side: Secondary | ICD-10-CM | POA: Diagnosis not present

## 2017-01-19 DIAGNOSIS — K219 Gastro-esophageal reflux disease without esophagitis: Secondary | ICD-10-CM | POA: Diagnosis not present

## 2017-01-19 DIAGNOSIS — I10 Essential (primary) hypertension: Secondary | ICD-10-CM | POA: Diagnosis not present

## 2017-01-20 DIAGNOSIS — K219 Gastro-esophageal reflux disease without esophagitis: Secondary | ICD-10-CM | POA: Diagnosis not present

## 2017-01-20 DIAGNOSIS — M5430 Sciatica, unspecified side: Secondary | ICD-10-CM | POA: Diagnosis not present

## 2017-01-20 DIAGNOSIS — I10 Essential (primary) hypertension: Secondary | ICD-10-CM | POA: Diagnosis not present

## 2017-01-21 DIAGNOSIS — I10 Essential (primary) hypertension: Secondary | ICD-10-CM | POA: Diagnosis not present

## 2017-01-21 DIAGNOSIS — K219 Gastro-esophageal reflux disease without esophagitis: Secondary | ICD-10-CM | POA: Diagnosis not present

## 2017-01-21 DIAGNOSIS — M5430 Sciatica, unspecified side: Secondary | ICD-10-CM | POA: Diagnosis not present

## 2017-01-22 DIAGNOSIS — I10 Essential (primary) hypertension: Secondary | ICD-10-CM | POA: Diagnosis not present

## 2017-01-22 DIAGNOSIS — M5430 Sciatica, unspecified side: Secondary | ICD-10-CM | POA: Diagnosis not present

## 2017-01-22 DIAGNOSIS — K219 Gastro-esophageal reflux disease without esophagitis: Secondary | ICD-10-CM | POA: Diagnosis not present

## 2017-01-23 DIAGNOSIS — I10 Essential (primary) hypertension: Secondary | ICD-10-CM | POA: Diagnosis not present

## 2017-01-23 DIAGNOSIS — M5430 Sciatica, unspecified side: Secondary | ICD-10-CM | POA: Diagnosis not present

## 2017-01-23 DIAGNOSIS — K219 Gastro-esophageal reflux disease without esophagitis: Secondary | ICD-10-CM | POA: Diagnosis not present

## 2017-01-24 DIAGNOSIS — I10 Essential (primary) hypertension: Secondary | ICD-10-CM | POA: Diagnosis not present

## 2017-01-24 DIAGNOSIS — M5430 Sciatica, unspecified side: Secondary | ICD-10-CM | POA: Diagnosis not present

## 2017-01-24 DIAGNOSIS — K219 Gastro-esophageal reflux disease without esophagitis: Secondary | ICD-10-CM | POA: Diagnosis not present

## 2017-01-25 DIAGNOSIS — M5430 Sciatica, unspecified side: Secondary | ICD-10-CM | POA: Diagnosis not present

## 2017-01-25 DIAGNOSIS — I10 Essential (primary) hypertension: Secondary | ICD-10-CM | POA: Diagnosis not present

## 2017-01-25 DIAGNOSIS — K219 Gastro-esophageal reflux disease without esophagitis: Secondary | ICD-10-CM | POA: Diagnosis not present

## 2017-01-26 DIAGNOSIS — I10 Essential (primary) hypertension: Secondary | ICD-10-CM | POA: Diagnosis not present

## 2017-01-26 DIAGNOSIS — M5430 Sciatica, unspecified side: Secondary | ICD-10-CM | POA: Diagnosis not present

## 2017-01-26 DIAGNOSIS — K219 Gastro-esophageal reflux disease without esophagitis: Secondary | ICD-10-CM | POA: Diagnosis not present

## 2017-01-27 DIAGNOSIS — I10 Essential (primary) hypertension: Secondary | ICD-10-CM | POA: Diagnosis not present

## 2017-01-27 DIAGNOSIS — M5430 Sciatica, unspecified side: Secondary | ICD-10-CM | POA: Diagnosis not present

## 2017-01-27 DIAGNOSIS — K219 Gastro-esophageal reflux disease without esophagitis: Secondary | ICD-10-CM | POA: Diagnosis not present

## 2017-01-28 DIAGNOSIS — I10 Essential (primary) hypertension: Secondary | ICD-10-CM | POA: Diagnosis not present

## 2017-01-28 DIAGNOSIS — M5430 Sciatica, unspecified side: Secondary | ICD-10-CM | POA: Diagnosis not present

## 2017-01-28 DIAGNOSIS — K219 Gastro-esophageal reflux disease without esophagitis: Secondary | ICD-10-CM | POA: Diagnosis not present

## 2017-01-29 DIAGNOSIS — K219 Gastro-esophageal reflux disease without esophagitis: Secondary | ICD-10-CM | POA: Diagnosis not present

## 2017-01-29 DIAGNOSIS — M5430 Sciatica, unspecified side: Secondary | ICD-10-CM | POA: Diagnosis not present

## 2017-01-29 DIAGNOSIS — I10 Essential (primary) hypertension: Secondary | ICD-10-CM | POA: Diagnosis not present

## 2017-01-30 DIAGNOSIS — M5430 Sciatica, unspecified side: Secondary | ICD-10-CM | POA: Diagnosis not present

## 2017-01-30 DIAGNOSIS — I10 Essential (primary) hypertension: Secondary | ICD-10-CM | POA: Diagnosis not present

## 2017-01-30 DIAGNOSIS — K219 Gastro-esophageal reflux disease without esophagitis: Secondary | ICD-10-CM | POA: Diagnosis not present

## 2017-01-31 DIAGNOSIS — K219 Gastro-esophageal reflux disease without esophagitis: Secondary | ICD-10-CM | POA: Diagnosis not present

## 2017-01-31 DIAGNOSIS — I10 Essential (primary) hypertension: Secondary | ICD-10-CM | POA: Diagnosis not present

## 2017-01-31 DIAGNOSIS — M5430 Sciatica, unspecified side: Secondary | ICD-10-CM | POA: Diagnosis not present

## 2017-02-01 DIAGNOSIS — M5430 Sciatica, unspecified side: Secondary | ICD-10-CM | POA: Diagnosis not present

## 2017-02-01 DIAGNOSIS — K219 Gastro-esophageal reflux disease without esophagitis: Secondary | ICD-10-CM | POA: Diagnosis not present

## 2017-02-01 DIAGNOSIS — I10 Essential (primary) hypertension: Secondary | ICD-10-CM | POA: Diagnosis not present

## 2017-02-02 DIAGNOSIS — M5430 Sciatica, unspecified side: Secondary | ICD-10-CM | POA: Diagnosis not present

## 2017-02-02 DIAGNOSIS — I10 Essential (primary) hypertension: Secondary | ICD-10-CM | POA: Diagnosis not present

## 2017-02-02 DIAGNOSIS — K219 Gastro-esophageal reflux disease without esophagitis: Secondary | ICD-10-CM | POA: Diagnosis not present

## 2017-02-03 DIAGNOSIS — K219 Gastro-esophageal reflux disease without esophagitis: Secondary | ICD-10-CM | POA: Diagnosis not present

## 2017-02-03 DIAGNOSIS — M5430 Sciatica, unspecified side: Secondary | ICD-10-CM | POA: Diagnosis not present

## 2017-02-03 DIAGNOSIS — I10 Essential (primary) hypertension: Secondary | ICD-10-CM | POA: Diagnosis not present

## 2017-02-04 DIAGNOSIS — M5430 Sciatica, unspecified side: Secondary | ICD-10-CM | POA: Diagnosis not present

## 2017-02-04 DIAGNOSIS — K219 Gastro-esophageal reflux disease without esophagitis: Secondary | ICD-10-CM | POA: Diagnosis not present

## 2017-02-04 DIAGNOSIS — I10 Essential (primary) hypertension: Secondary | ICD-10-CM | POA: Diagnosis not present

## 2017-02-05 DIAGNOSIS — M5430 Sciatica, unspecified side: Secondary | ICD-10-CM | POA: Diagnosis not present

## 2017-02-05 DIAGNOSIS — I10 Essential (primary) hypertension: Secondary | ICD-10-CM | POA: Diagnosis not present

## 2017-02-05 DIAGNOSIS — K219 Gastro-esophageal reflux disease without esophagitis: Secondary | ICD-10-CM | POA: Diagnosis not present

## 2017-03-04 DIAGNOSIS — M5430 Sciatica, unspecified side: Secondary | ICD-10-CM | POA: Diagnosis not present

## 2017-03-04 DIAGNOSIS — K219 Gastro-esophageal reflux disease without esophagitis: Secondary | ICD-10-CM | POA: Diagnosis not present

## 2017-03-04 DIAGNOSIS — I10 Essential (primary) hypertension: Secondary | ICD-10-CM | POA: Diagnosis not present

## 2017-03-05 DIAGNOSIS — K219 Gastro-esophageal reflux disease without esophagitis: Secondary | ICD-10-CM | POA: Diagnosis not present

## 2017-03-05 DIAGNOSIS — M5430 Sciatica, unspecified side: Secondary | ICD-10-CM | POA: Diagnosis not present

## 2017-03-05 DIAGNOSIS — I10 Essential (primary) hypertension: Secondary | ICD-10-CM | POA: Diagnosis not present

## 2017-03-06 DIAGNOSIS — M5430 Sciatica, unspecified side: Secondary | ICD-10-CM | POA: Diagnosis not present

## 2017-03-06 DIAGNOSIS — K219 Gastro-esophageal reflux disease without esophagitis: Secondary | ICD-10-CM | POA: Diagnosis not present

## 2017-03-06 DIAGNOSIS — I10 Essential (primary) hypertension: Secondary | ICD-10-CM | POA: Diagnosis not present

## 2017-03-07 DIAGNOSIS — K219 Gastro-esophageal reflux disease without esophagitis: Secondary | ICD-10-CM | POA: Diagnosis not present

## 2017-03-07 DIAGNOSIS — I10 Essential (primary) hypertension: Secondary | ICD-10-CM | POA: Diagnosis not present

## 2017-03-07 DIAGNOSIS — M5430 Sciatica, unspecified side: Secondary | ICD-10-CM | POA: Diagnosis not present

## 2017-03-08 DIAGNOSIS — I10 Essential (primary) hypertension: Secondary | ICD-10-CM | POA: Diagnosis not present

## 2017-03-08 DIAGNOSIS — K219 Gastro-esophageal reflux disease without esophagitis: Secondary | ICD-10-CM | POA: Diagnosis not present

## 2017-03-08 DIAGNOSIS — M5430 Sciatica, unspecified side: Secondary | ICD-10-CM | POA: Diagnosis not present

## 2017-03-18 DIAGNOSIS — I10 Essential (primary) hypertension: Secondary | ICD-10-CM | POA: Diagnosis not present

## 2017-03-19 DIAGNOSIS — I10 Essential (primary) hypertension: Secondary | ICD-10-CM | POA: Diagnosis not present

## 2017-03-20 DIAGNOSIS — I10 Essential (primary) hypertension: Secondary | ICD-10-CM | POA: Diagnosis not present

## 2017-03-21 DIAGNOSIS — I10 Essential (primary) hypertension: Secondary | ICD-10-CM | POA: Diagnosis not present

## 2017-03-22 DIAGNOSIS — I10 Essential (primary) hypertension: Secondary | ICD-10-CM | POA: Diagnosis not present

## 2017-03-23 DIAGNOSIS — I10 Essential (primary) hypertension: Secondary | ICD-10-CM | POA: Diagnosis not present

## 2017-03-24 DIAGNOSIS — I10 Essential (primary) hypertension: Secondary | ICD-10-CM | POA: Diagnosis not present

## 2017-03-26 DIAGNOSIS — I10 Essential (primary) hypertension: Secondary | ICD-10-CM | POA: Diagnosis not present

## 2017-03-27 DIAGNOSIS — I10 Essential (primary) hypertension: Secondary | ICD-10-CM | POA: Diagnosis not present

## 2017-03-28 DIAGNOSIS — I10 Essential (primary) hypertension: Secondary | ICD-10-CM | POA: Diagnosis not present

## 2017-03-29 DIAGNOSIS — I10 Essential (primary) hypertension: Secondary | ICD-10-CM | POA: Diagnosis not present

## 2017-03-30 DIAGNOSIS — I10 Essential (primary) hypertension: Secondary | ICD-10-CM | POA: Diagnosis not present

## 2017-03-31 DIAGNOSIS — I10 Essential (primary) hypertension: Secondary | ICD-10-CM | POA: Diagnosis not present

## 2017-04-01 DIAGNOSIS — I10 Essential (primary) hypertension: Secondary | ICD-10-CM | POA: Diagnosis not present

## 2017-04-02 DIAGNOSIS — I10 Essential (primary) hypertension: Secondary | ICD-10-CM | POA: Diagnosis not present

## 2017-04-03 DIAGNOSIS — I10 Essential (primary) hypertension: Secondary | ICD-10-CM | POA: Diagnosis not present

## 2017-04-04 DIAGNOSIS — I10 Essential (primary) hypertension: Secondary | ICD-10-CM | POA: Diagnosis not present

## 2017-04-05 DIAGNOSIS — I10 Essential (primary) hypertension: Secondary | ICD-10-CM | POA: Diagnosis not present

## 2017-04-06 DIAGNOSIS — I10 Essential (primary) hypertension: Secondary | ICD-10-CM | POA: Diagnosis not present

## 2017-04-07 DIAGNOSIS — I10 Essential (primary) hypertension: Secondary | ICD-10-CM | POA: Diagnosis not present

## 2017-04-08 DIAGNOSIS — I10 Essential (primary) hypertension: Secondary | ICD-10-CM | POA: Diagnosis not present

## 2017-04-09 DIAGNOSIS — I10 Essential (primary) hypertension: Secondary | ICD-10-CM | POA: Diagnosis not present

## 2017-04-10 DIAGNOSIS — I10 Essential (primary) hypertension: Secondary | ICD-10-CM | POA: Diagnosis not present

## 2017-04-11 DIAGNOSIS — I10 Essential (primary) hypertension: Secondary | ICD-10-CM | POA: Diagnosis not present

## 2017-04-12 DIAGNOSIS — I10 Essential (primary) hypertension: Secondary | ICD-10-CM | POA: Diagnosis not present

## 2017-04-13 DIAGNOSIS — I10 Essential (primary) hypertension: Secondary | ICD-10-CM | POA: Diagnosis not present

## 2017-04-14 DIAGNOSIS — I10 Essential (primary) hypertension: Secondary | ICD-10-CM | POA: Diagnosis not present

## 2017-04-15 DIAGNOSIS — I10 Essential (primary) hypertension: Secondary | ICD-10-CM | POA: Diagnosis not present

## 2017-04-16 DIAGNOSIS — I10 Essential (primary) hypertension: Secondary | ICD-10-CM | POA: Diagnosis not present

## 2017-04-17 DIAGNOSIS — I10 Essential (primary) hypertension: Secondary | ICD-10-CM | POA: Diagnosis not present

## 2017-04-18 DIAGNOSIS — I10 Essential (primary) hypertension: Secondary | ICD-10-CM | POA: Diagnosis not present

## 2017-04-19 DIAGNOSIS — I10 Essential (primary) hypertension: Secondary | ICD-10-CM | POA: Diagnosis not present

## 2017-04-20 DIAGNOSIS — I10 Essential (primary) hypertension: Secondary | ICD-10-CM | POA: Diagnosis not present

## 2017-04-21 DIAGNOSIS — I10 Essential (primary) hypertension: Secondary | ICD-10-CM | POA: Diagnosis not present

## 2017-04-22 DIAGNOSIS — I10 Essential (primary) hypertension: Secondary | ICD-10-CM | POA: Diagnosis not present

## 2017-04-23 DIAGNOSIS — I10 Essential (primary) hypertension: Secondary | ICD-10-CM | POA: Diagnosis not present

## 2017-04-24 DIAGNOSIS — I10 Essential (primary) hypertension: Secondary | ICD-10-CM | POA: Diagnosis not present

## 2017-04-25 DIAGNOSIS — I10 Essential (primary) hypertension: Secondary | ICD-10-CM | POA: Diagnosis not present

## 2017-04-26 DIAGNOSIS — I10 Essential (primary) hypertension: Secondary | ICD-10-CM | POA: Diagnosis not present

## 2017-04-27 DIAGNOSIS — I10 Essential (primary) hypertension: Secondary | ICD-10-CM | POA: Diagnosis not present

## 2017-04-28 DIAGNOSIS — I10 Essential (primary) hypertension: Secondary | ICD-10-CM | POA: Diagnosis not present

## 2017-04-29 DIAGNOSIS — I10 Essential (primary) hypertension: Secondary | ICD-10-CM | POA: Diagnosis not present

## 2017-04-30 DIAGNOSIS — I10 Essential (primary) hypertension: Secondary | ICD-10-CM | POA: Diagnosis not present

## 2017-05-01 DIAGNOSIS — I10 Essential (primary) hypertension: Secondary | ICD-10-CM | POA: Diagnosis not present

## 2017-05-02 DIAGNOSIS — I10 Essential (primary) hypertension: Secondary | ICD-10-CM | POA: Diagnosis not present

## 2017-05-03 DIAGNOSIS — I10 Essential (primary) hypertension: Secondary | ICD-10-CM | POA: Diagnosis not present

## 2017-05-04 DIAGNOSIS — I10 Essential (primary) hypertension: Secondary | ICD-10-CM | POA: Diagnosis not present

## 2017-05-05 DIAGNOSIS — I10 Essential (primary) hypertension: Secondary | ICD-10-CM | POA: Diagnosis not present

## 2017-05-06 DIAGNOSIS — I10 Essential (primary) hypertension: Secondary | ICD-10-CM | POA: Diagnosis not present

## 2017-05-07 DIAGNOSIS — I10 Essential (primary) hypertension: Secondary | ICD-10-CM | POA: Diagnosis not present

## 2017-05-08 DIAGNOSIS — I10 Essential (primary) hypertension: Secondary | ICD-10-CM | POA: Diagnosis not present

## 2017-05-09 DIAGNOSIS — I10 Essential (primary) hypertension: Secondary | ICD-10-CM | POA: Diagnosis not present

## 2017-05-10 DIAGNOSIS — I10 Essential (primary) hypertension: Secondary | ICD-10-CM | POA: Diagnosis not present

## 2017-05-11 DIAGNOSIS — I10 Essential (primary) hypertension: Secondary | ICD-10-CM | POA: Diagnosis not present

## 2017-05-12 DIAGNOSIS — I10 Essential (primary) hypertension: Secondary | ICD-10-CM | POA: Diagnosis not present

## 2017-05-13 DIAGNOSIS — I10 Essential (primary) hypertension: Secondary | ICD-10-CM | POA: Diagnosis not present

## 2017-05-14 DIAGNOSIS — I10 Essential (primary) hypertension: Secondary | ICD-10-CM | POA: Diagnosis not present

## 2017-05-21 DIAGNOSIS — K219 Gastro-esophageal reflux disease without esophagitis: Secondary | ICD-10-CM | POA: Diagnosis not present

## 2017-05-22 DIAGNOSIS — K219 Gastro-esophageal reflux disease without esophagitis: Secondary | ICD-10-CM | POA: Diagnosis not present

## 2017-05-23 DIAGNOSIS — K219 Gastro-esophageal reflux disease without esophagitis: Secondary | ICD-10-CM | POA: Diagnosis not present

## 2017-05-24 DIAGNOSIS — K219 Gastro-esophageal reflux disease without esophagitis: Secondary | ICD-10-CM | POA: Diagnosis not present

## 2017-05-25 DIAGNOSIS — K219 Gastro-esophageal reflux disease without esophagitis: Secondary | ICD-10-CM | POA: Diagnosis not present

## 2017-05-26 DIAGNOSIS — K219 Gastro-esophageal reflux disease without esophagitis: Secondary | ICD-10-CM | POA: Diagnosis not present

## 2017-05-27 DIAGNOSIS — K219 Gastro-esophageal reflux disease without esophagitis: Secondary | ICD-10-CM | POA: Diagnosis not present

## 2017-05-28 DIAGNOSIS — K219 Gastro-esophageal reflux disease without esophagitis: Secondary | ICD-10-CM | POA: Diagnosis not present

## 2017-05-29 DIAGNOSIS — K219 Gastro-esophageal reflux disease without esophagitis: Secondary | ICD-10-CM | POA: Diagnosis not present

## 2017-05-30 DIAGNOSIS — K219 Gastro-esophageal reflux disease without esophagitis: Secondary | ICD-10-CM | POA: Diagnosis not present

## 2017-05-31 DIAGNOSIS — K219 Gastro-esophageal reflux disease without esophagitis: Secondary | ICD-10-CM | POA: Diagnosis not present

## 2017-06-01 DIAGNOSIS — K219 Gastro-esophageal reflux disease without esophagitis: Secondary | ICD-10-CM | POA: Diagnosis not present

## 2017-06-02 DIAGNOSIS — K219 Gastro-esophageal reflux disease without esophagitis: Secondary | ICD-10-CM | POA: Diagnosis not present

## 2017-06-03 DIAGNOSIS — K219 Gastro-esophageal reflux disease without esophagitis: Secondary | ICD-10-CM | POA: Diagnosis not present

## 2017-06-04 DIAGNOSIS — K219 Gastro-esophageal reflux disease without esophagitis: Secondary | ICD-10-CM | POA: Diagnosis not present

## 2017-06-05 DIAGNOSIS — K219 Gastro-esophageal reflux disease without esophagitis: Secondary | ICD-10-CM | POA: Diagnosis not present

## 2017-06-06 DIAGNOSIS — K219 Gastro-esophageal reflux disease without esophagitis: Secondary | ICD-10-CM | POA: Diagnosis not present

## 2017-06-07 DIAGNOSIS — K219 Gastro-esophageal reflux disease without esophagitis: Secondary | ICD-10-CM | POA: Diagnosis not present

## 2017-06-08 DIAGNOSIS — K219 Gastro-esophageal reflux disease without esophagitis: Secondary | ICD-10-CM | POA: Diagnosis not present

## 2017-06-09 DIAGNOSIS — K219 Gastro-esophageal reflux disease without esophagitis: Secondary | ICD-10-CM | POA: Diagnosis not present

## 2017-06-10 DIAGNOSIS — K219 Gastro-esophageal reflux disease without esophagitis: Secondary | ICD-10-CM | POA: Diagnosis not present

## 2017-06-11 DIAGNOSIS — K219 Gastro-esophageal reflux disease without esophagitis: Secondary | ICD-10-CM | POA: Diagnosis not present

## 2017-06-12 DIAGNOSIS — K219 Gastro-esophageal reflux disease without esophagitis: Secondary | ICD-10-CM | POA: Diagnosis not present

## 2017-06-13 DIAGNOSIS — K219 Gastro-esophageal reflux disease without esophagitis: Secondary | ICD-10-CM | POA: Diagnosis not present

## 2017-06-14 DIAGNOSIS — K219 Gastro-esophageal reflux disease without esophagitis: Secondary | ICD-10-CM | POA: Diagnosis not present

## 2017-06-15 DIAGNOSIS — K219 Gastro-esophageal reflux disease without esophagitis: Secondary | ICD-10-CM | POA: Diagnosis not present

## 2017-06-16 DIAGNOSIS — K219 Gastro-esophageal reflux disease without esophagitis: Secondary | ICD-10-CM | POA: Diagnosis not present

## 2017-06-17 DIAGNOSIS — K219 Gastro-esophageal reflux disease without esophagitis: Secondary | ICD-10-CM | POA: Diagnosis not present

## 2017-06-18 DIAGNOSIS — K219 Gastro-esophageal reflux disease without esophagitis: Secondary | ICD-10-CM | POA: Diagnosis not present

## 2017-06-19 DIAGNOSIS — K219 Gastro-esophageal reflux disease without esophagitis: Secondary | ICD-10-CM | POA: Diagnosis not present

## 2017-06-20 DIAGNOSIS — K219 Gastro-esophageal reflux disease without esophagitis: Secondary | ICD-10-CM | POA: Diagnosis not present

## 2017-06-21 DIAGNOSIS — K219 Gastro-esophageal reflux disease without esophagitis: Secondary | ICD-10-CM | POA: Diagnosis not present

## 2017-06-22 DIAGNOSIS — K219 Gastro-esophageal reflux disease without esophagitis: Secondary | ICD-10-CM | POA: Diagnosis not present

## 2017-06-23 DIAGNOSIS — K219 Gastro-esophageal reflux disease without esophagitis: Secondary | ICD-10-CM | POA: Diagnosis not present

## 2017-06-24 DIAGNOSIS — K219 Gastro-esophageal reflux disease without esophagitis: Secondary | ICD-10-CM | POA: Diagnosis not present

## 2017-06-25 DIAGNOSIS — K219 Gastro-esophageal reflux disease without esophagitis: Secondary | ICD-10-CM | POA: Diagnosis not present

## 2017-06-26 DIAGNOSIS — K219 Gastro-esophageal reflux disease without esophagitis: Secondary | ICD-10-CM | POA: Diagnosis not present

## 2017-06-27 DIAGNOSIS — K219 Gastro-esophageal reflux disease without esophagitis: Secondary | ICD-10-CM | POA: Diagnosis not present

## 2017-06-28 DIAGNOSIS — K219 Gastro-esophageal reflux disease without esophagitis: Secondary | ICD-10-CM | POA: Diagnosis not present

## 2017-06-29 DIAGNOSIS — K219 Gastro-esophageal reflux disease without esophagitis: Secondary | ICD-10-CM | POA: Diagnosis not present

## 2017-06-30 DIAGNOSIS — K219 Gastro-esophageal reflux disease without esophagitis: Secondary | ICD-10-CM | POA: Diagnosis not present

## 2017-07-01 DIAGNOSIS — K219 Gastro-esophageal reflux disease without esophagitis: Secondary | ICD-10-CM | POA: Diagnosis not present

## 2017-07-02 DIAGNOSIS — K219 Gastro-esophageal reflux disease without esophagitis: Secondary | ICD-10-CM | POA: Diagnosis not present

## 2017-07-02 DIAGNOSIS — M545 Low back pain: Secondary | ICD-10-CM | POA: Diagnosis not present

## 2017-07-02 DIAGNOSIS — R030 Elevated blood-pressure reading, without diagnosis of hypertension: Secondary | ICD-10-CM | POA: Diagnosis not present

## 2017-07-02 DIAGNOSIS — M5430 Sciatica, unspecified side: Secondary | ICD-10-CM | POA: Diagnosis not present

## 2017-07-03 DIAGNOSIS — K219 Gastro-esophageal reflux disease without esophagitis: Secondary | ICD-10-CM | POA: Diagnosis not present

## 2017-07-06 DIAGNOSIS — K219 Gastro-esophageal reflux disease without esophagitis: Secondary | ICD-10-CM | POA: Diagnosis not present

## 2017-07-07 DIAGNOSIS — K219 Gastro-esophageal reflux disease without esophagitis: Secondary | ICD-10-CM | POA: Diagnosis not present

## 2017-07-08 DIAGNOSIS — K219 Gastro-esophageal reflux disease without esophagitis: Secondary | ICD-10-CM | POA: Diagnosis not present

## 2017-07-09 DIAGNOSIS — K219 Gastro-esophageal reflux disease without esophagitis: Secondary | ICD-10-CM | POA: Diagnosis not present

## 2017-07-10 DIAGNOSIS — K219 Gastro-esophageal reflux disease without esophagitis: Secondary | ICD-10-CM | POA: Diagnosis not present

## 2017-07-11 DIAGNOSIS — K219 Gastro-esophageal reflux disease without esophagitis: Secondary | ICD-10-CM | POA: Diagnosis not present

## 2017-07-12 DIAGNOSIS — K219 Gastro-esophageal reflux disease without esophagitis: Secondary | ICD-10-CM | POA: Diagnosis not present

## 2017-07-13 DIAGNOSIS — K219 Gastro-esophageal reflux disease without esophagitis: Secondary | ICD-10-CM | POA: Diagnosis not present

## 2017-07-14 DIAGNOSIS — K219 Gastro-esophageal reflux disease without esophagitis: Secondary | ICD-10-CM | POA: Diagnosis not present

## 2017-07-15 DIAGNOSIS — K219 Gastro-esophageal reflux disease without esophagitis: Secondary | ICD-10-CM | POA: Diagnosis not present

## 2017-07-16 DIAGNOSIS — K219 Gastro-esophageal reflux disease without esophagitis: Secondary | ICD-10-CM | POA: Diagnosis not present

## 2017-07-17 DIAGNOSIS — K219 Gastro-esophageal reflux disease without esophagitis: Secondary | ICD-10-CM | POA: Diagnosis not present

## 2017-07-18 DIAGNOSIS — K219 Gastro-esophageal reflux disease without esophagitis: Secondary | ICD-10-CM | POA: Diagnosis not present

## 2017-07-19 DIAGNOSIS — K219 Gastro-esophageal reflux disease without esophagitis: Secondary | ICD-10-CM | POA: Diagnosis not present

## 2017-07-20 DIAGNOSIS — K219 Gastro-esophageal reflux disease without esophagitis: Secondary | ICD-10-CM | POA: Diagnosis not present

## 2017-07-21 DIAGNOSIS — K219 Gastro-esophageal reflux disease without esophagitis: Secondary | ICD-10-CM | POA: Diagnosis not present

## 2017-07-22 DIAGNOSIS — K219 Gastro-esophageal reflux disease without esophagitis: Secondary | ICD-10-CM | POA: Diagnosis not present

## 2017-07-23 DIAGNOSIS — K219 Gastro-esophageal reflux disease without esophagitis: Secondary | ICD-10-CM | POA: Diagnosis not present

## 2017-07-24 DIAGNOSIS — K219 Gastro-esophageal reflux disease without esophagitis: Secondary | ICD-10-CM | POA: Diagnosis not present

## 2017-07-25 DIAGNOSIS — K219 Gastro-esophageal reflux disease without esophagitis: Secondary | ICD-10-CM | POA: Diagnosis not present

## 2017-07-26 DIAGNOSIS — K219 Gastro-esophageal reflux disease without esophagitis: Secondary | ICD-10-CM | POA: Diagnosis not present

## 2017-07-27 DIAGNOSIS — K219 Gastro-esophageal reflux disease without esophagitis: Secondary | ICD-10-CM | POA: Diagnosis not present

## 2017-07-28 DIAGNOSIS — K219 Gastro-esophageal reflux disease without esophagitis: Secondary | ICD-10-CM | POA: Diagnosis not present

## 2017-07-29 DIAGNOSIS — K219 Gastro-esophageal reflux disease without esophagitis: Secondary | ICD-10-CM | POA: Diagnosis not present

## 2017-07-30 DIAGNOSIS — K219 Gastro-esophageal reflux disease without esophagitis: Secondary | ICD-10-CM | POA: Diagnosis not present

## 2017-07-31 DIAGNOSIS — K219 Gastro-esophageal reflux disease without esophagitis: Secondary | ICD-10-CM | POA: Diagnosis not present

## 2017-08-01 DIAGNOSIS — K219 Gastro-esophageal reflux disease without esophagitis: Secondary | ICD-10-CM | POA: Diagnosis not present

## 2017-08-02 DIAGNOSIS — K219 Gastro-esophageal reflux disease without esophagitis: Secondary | ICD-10-CM | POA: Diagnosis not present

## 2017-08-03 DIAGNOSIS — K219 Gastro-esophageal reflux disease without esophagitis: Secondary | ICD-10-CM | POA: Diagnosis not present

## 2017-08-04 DIAGNOSIS — K219 Gastro-esophageal reflux disease without esophagitis: Secondary | ICD-10-CM | POA: Diagnosis not present

## 2017-08-05 DIAGNOSIS — K219 Gastro-esophageal reflux disease without esophagitis: Secondary | ICD-10-CM | POA: Diagnosis not present

## 2017-08-06 DIAGNOSIS — K219 Gastro-esophageal reflux disease without esophagitis: Secondary | ICD-10-CM | POA: Diagnosis not present

## 2017-08-07 DIAGNOSIS — K219 Gastro-esophageal reflux disease without esophagitis: Secondary | ICD-10-CM | POA: Diagnosis not present

## 2017-08-08 DIAGNOSIS — K219 Gastro-esophageal reflux disease without esophagitis: Secondary | ICD-10-CM | POA: Diagnosis not present

## 2017-08-09 DIAGNOSIS — K219 Gastro-esophageal reflux disease without esophagitis: Secondary | ICD-10-CM | POA: Diagnosis not present

## 2017-08-10 DIAGNOSIS — K219 Gastro-esophageal reflux disease without esophagitis: Secondary | ICD-10-CM | POA: Diagnosis not present

## 2017-08-11 DIAGNOSIS — K219 Gastro-esophageal reflux disease without esophagitis: Secondary | ICD-10-CM | POA: Diagnosis not present

## 2017-08-12 DIAGNOSIS — K219 Gastro-esophageal reflux disease without esophagitis: Secondary | ICD-10-CM | POA: Diagnosis not present

## 2017-08-13 DIAGNOSIS — K219 Gastro-esophageal reflux disease without esophagitis: Secondary | ICD-10-CM | POA: Diagnosis not present

## 2017-08-14 DIAGNOSIS — K219 Gastro-esophageal reflux disease without esophagitis: Secondary | ICD-10-CM | POA: Diagnosis not present

## 2017-08-15 DIAGNOSIS — K219 Gastro-esophageal reflux disease without esophagitis: Secondary | ICD-10-CM | POA: Diagnosis not present

## 2017-08-16 DIAGNOSIS — K219 Gastro-esophageal reflux disease without esophagitis: Secondary | ICD-10-CM | POA: Diagnosis not present

## 2017-08-17 DIAGNOSIS — K219 Gastro-esophageal reflux disease without esophagitis: Secondary | ICD-10-CM | POA: Diagnosis not present

## 2017-08-18 DIAGNOSIS — K219 Gastro-esophageal reflux disease without esophagitis: Secondary | ICD-10-CM | POA: Diagnosis not present

## 2017-08-19 DIAGNOSIS — K219 Gastro-esophageal reflux disease without esophagitis: Secondary | ICD-10-CM | POA: Diagnosis not present

## 2017-08-20 DIAGNOSIS — K219 Gastro-esophageal reflux disease without esophagitis: Secondary | ICD-10-CM | POA: Diagnosis not present

## 2017-08-21 DIAGNOSIS — K219 Gastro-esophageal reflux disease without esophagitis: Secondary | ICD-10-CM | POA: Diagnosis not present

## 2017-08-22 DIAGNOSIS — K219 Gastro-esophageal reflux disease without esophagitis: Secondary | ICD-10-CM | POA: Diagnosis not present

## 2017-08-23 DIAGNOSIS — K219 Gastro-esophageal reflux disease without esophagitis: Secondary | ICD-10-CM | POA: Diagnosis not present

## 2017-08-24 DIAGNOSIS — K219 Gastro-esophageal reflux disease without esophagitis: Secondary | ICD-10-CM | POA: Diagnosis not present

## 2017-08-25 DIAGNOSIS — K219 Gastro-esophageal reflux disease without esophagitis: Secondary | ICD-10-CM | POA: Diagnosis not present

## 2017-08-26 DIAGNOSIS — K219 Gastro-esophageal reflux disease without esophagitis: Secondary | ICD-10-CM | POA: Diagnosis not present

## 2017-08-27 DIAGNOSIS — K219 Gastro-esophageal reflux disease without esophagitis: Secondary | ICD-10-CM | POA: Diagnosis not present

## 2017-08-28 DIAGNOSIS — K219 Gastro-esophageal reflux disease without esophagitis: Secondary | ICD-10-CM | POA: Diagnosis not present

## 2017-08-29 DIAGNOSIS — K219 Gastro-esophageal reflux disease without esophagitis: Secondary | ICD-10-CM | POA: Diagnosis not present

## 2017-08-30 DIAGNOSIS — K219 Gastro-esophageal reflux disease without esophagitis: Secondary | ICD-10-CM | POA: Diagnosis not present

## 2017-08-31 DIAGNOSIS — K219 Gastro-esophageal reflux disease without esophagitis: Secondary | ICD-10-CM | POA: Diagnosis not present

## 2017-09-01 DIAGNOSIS — K219 Gastro-esophageal reflux disease without esophagitis: Secondary | ICD-10-CM | POA: Diagnosis not present

## 2017-09-02 DIAGNOSIS — K219 Gastro-esophageal reflux disease without esophagitis: Secondary | ICD-10-CM | POA: Diagnosis not present

## 2017-09-03 DIAGNOSIS — K219 Gastro-esophageal reflux disease without esophagitis: Secondary | ICD-10-CM | POA: Diagnosis not present

## 2017-09-07 DIAGNOSIS — K219 Gastro-esophageal reflux disease without esophagitis: Secondary | ICD-10-CM | POA: Diagnosis not present

## 2017-09-08 DIAGNOSIS — K219 Gastro-esophageal reflux disease without esophagitis: Secondary | ICD-10-CM | POA: Diagnosis not present

## 2017-09-09 DIAGNOSIS — K219 Gastro-esophageal reflux disease without esophagitis: Secondary | ICD-10-CM | POA: Diagnosis not present

## 2017-09-10 DIAGNOSIS — K219 Gastro-esophageal reflux disease without esophagitis: Secondary | ICD-10-CM | POA: Diagnosis not present

## 2017-09-11 DIAGNOSIS — K219 Gastro-esophageal reflux disease without esophagitis: Secondary | ICD-10-CM | POA: Diagnosis not present

## 2017-09-12 DIAGNOSIS — K219 Gastro-esophageal reflux disease without esophagitis: Secondary | ICD-10-CM | POA: Diagnosis not present

## 2017-09-13 DIAGNOSIS — K219 Gastro-esophageal reflux disease without esophagitis: Secondary | ICD-10-CM | POA: Diagnosis not present

## 2017-09-14 DIAGNOSIS — K219 Gastro-esophageal reflux disease without esophagitis: Secondary | ICD-10-CM | POA: Diagnosis not present

## 2017-09-15 DIAGNOSIS — K219 Gastro-esophageal reflux disease without esophagitis: Secondary | ICD-10-CM | POA: Diagnosis not present

## 2017-09-16 DIAGNOSIS — K219 Gastro-esophageal reflux disease without esophagitis: Secondary | ICD-10-CM | POA: Diagnosis not present

## 2017-09-17 DIAGNOSIS — K219 Gastro-esophageal reflux disease without esophagitis: Secondary | ICD-10-CM | POA: Diagnosis not present

## 2017-09-18 DIAGNOSIS — K219 Gastro-esophageal reflux disease without esophagitis: Secondary | ICD-10-CM | POA: Diagnosis not present

## 2017-09-19 DIAGNOSIS — K219 Gastro-esophageal reflux disease without esophagitis: Secondary | ICD-10-CM | POA: Diagnosis not present

## 2017-09-20 DIAGNOSIS — K219 Gastro-esophageal reflux disease without esophagitis: Secondary | ICD-10-CM | POA: Diagnosis not present

## 2017-09-21 DIAGNOSIS — K219 Gastro-esophageal reflux disease without esophagitis: Secondary | ICD-10-CM | POA: Diagnosis not present

## 2017-09-22 DIAGNOSIS — K219 Gastro-esophageal reflux disease without esophagitis: Secondary | ICD-10-CM | POA: Diagnosis not present

## 2017-09-23 DIAGNOSIS — K219 Gastro-esophageal reflux disease without esophagitis: Secondary | ICD-10-CM | POA: Diagnosis not present

## 2017-09-24 DIAGNOSIS — K219 Gastro-esophageal reflux disease without esophagitis: Secondary | ICD-10-CM | POA: Diagnosis not present

## 2017-09-25 DIAGNOSIS — K219 Gastro-esophageal reflux disease without esophagitis: Secondary | ICD-10-CM | POA: Diagnosis not present

## 2017-09-26 DIAGNOSIS — K219 Gastro-esophageal reflux disease without esophagitis: Secondary | ICD-10-CM | POA: Diagnosis not present

## 2017-09-27 DIAGNOSIS — K219 Gastro-esophageal reflux disease without esophagitis: Secondary | ICD-10-CM | POA: Diagnosis not present

## 2017-09-28 DIAGNOSIS — K219 Gastro-esophageal reflux disease without esophagitis: Secondary | ICD-10-CM | POA: Diagnosis not present

## 2017-09-29 DIAGNOSIS — K219 Gastro-esophageal reflux disease without esophagitis: Secondary | ICD-10-CM | POA: Diagnosis not present

## 2017-09-30 DIAGNOSIS — K219 Gastro-esophageal reflux disease without esophagitis: Secondary | ICD-10-CM | POA: Diagnosis not present

## 2017-10-01 DIAGNOSIS — K219 Gastro-esophageal reflux disease without esophagitis: Secondary | ICD-10-CM | POA: Diagnosis not present

## 2017-10-02 DIAGNOSIS — K219 Gastro-esophageal reflux disease without esophagitis: Secondary | ICD-10-CM | POA: Diagnosis not present

## 2017-10-03 DIAGNOSIS — K219 Gastro-esophageal reflux disease without esophagitis: Secondary | ICD-10-CM | POA: Diagnosis not present

## 2017-10-04 DIAGNOSIS — K219 Gastro-esophageal reflux disease without esophagitis: Secondary | ICD-10-CM | POA: Diagnosis not present

## 2017-10-05 DIAGNOSIS — K219 Gastro-esophageal reflux disease without esophagitis: Secondary | ICD-10-CM | POA: Diagnosis not present

## 2017-10-06 DIAGNOSIS — K219 Gastro-esophageal reflux disease without esophagitis: Secondary | ICD-10-CM | POA: Diagnosis not present

## 2017-10-07 DIAGNOSIS — K219 Gastro-esophageal reflux disease without esophagitis: Secondary | ICD-10-CM | POA: Diagnosis not present

## 2017-10-08 DIAGNOSIS — K219 Gastro-esophageal reflux disease without esophagitis: Secondary | ICD-10-CM | POA: Diagnosis not present

## 2017-10-09 DIAGNOSIS — K219 Gastro-esophageal reflux disease without esophagitis: Secondary | ICD-10-CM | POA: Diagnosis not present

## 2017-10-10 DIAGNOSIS — K219 Gastro-esophageal reflux disease without esophagitis: Secondary | ICD-10-CM | POA: Diagnosis not present

## 2017-10-11 DIAGNOSIS — K219 Gastro-esophageal reflux disease without esophagitis: Secondary | ICD-10-CM | POA: Diagnosis not present

## 2017-10-12 DIAGNOSIS — K219 Gastro-esophageal reflux disease without esophagitis: Secondary | ICD-10-CM | POA: Diagnosis not present

## 2017-10-13 DIAGNOSIS — K219 Gastro-esophageal reflux disease without esophagitis: Secondary | ICD-10-CM | POA: Diagnosis not present

## 2017-10-14 DIAGNOSIS — K219 Gastro-esophageal reflux disease without esophagitis: Secondary | ICD-10-CM | POA: Diagnosis not present

## 2017-10-15 DIAGNOSIS — K219 Gastro-esophageal reflux disease without esophagitis: Secondary | ICD-10-CM | POA: Diagnosis not present

## 2017-10-16 DIAGNOSIS — K219 Gastro-esophageal reflux disease without esophagitis: Secondary | ICD-10-CM | POA: Diagnosis not present

## 2017-10-17 DIAGNOSIS — K219 Gastro-esophageal reflux disease without esophagitis: Secondary | ICD-10-CM | POA: Diagnosis not present

## 2017-10-18 DIAGNOSIS — K219 Gastro-esophageal reflux disease without esophagitis: Secondary | ICD-10-CM | POA: Diagnosis not present

## 2017-10-19 DIAGNOSIS — K219 Gastro-esophageal reflux disease without esophagitis: Secondary | ICD-10-CM | POA: Diagnosis not present

## 2017-10-20 DIAGNOSIS — K219 Gastro-esophageal reflux disease without esophagitis: Secondary | ICD-10-CM | POA: Diagnosis not present

## 2017-10-21 DIAGNOSIS — K219 Gastro-esophageal reflux disease without esophagitis: Secondary | ICD-10-CM | POA: Diagnosis not present

## 2017-10-22 DIAGNOSIS — K219 Gastro-esophageal reflux disease without esophagitis: Secondary | ICD-10-CM | POA: Diagnosis not present

## 2017-10-23 DIAGNOSIS — K219 Gastro-esophageal reflux disease without esophagitis: Secondary | ICD-10-CM | POA: Diagnosis not present

## 2017-10-24 DIAGNOSIS — K219 Gastro-esophageal reflux disease without esophagitis: Secondary | ICD-10-CM | POA: Diagnosis not present

## 2017-10-25 DIAGNOSIS — K219 Gastro-esophageal reflux disease without esophagitis: Secondary | ICD-10-CM | POA: Diagnosis not present

## 2017-10-26 DIAGNOSIS — K219 Gastro-esophageal reflux disease without esophagitis: Secondary | ICD-10-CM | POA: Diagnosis not present

## 2017-10-27 DIAGNOSIS — K219 Gastro-esophageal reflux disease without esophagitis: Secondary | ICD-10-CM | POA: Diagnosis not present

## 2017-10-28 DIAGNOSIS — K219 Gastro-esophageal reflux disease without esophagitis: Secondary | ICD-10-CM | POA: Diagnosis not present

## 2017-10-29 DIAGNOSIS — K219 Gastro-esophageal reflux disease without esophagitis: Secondary | ICD-10-CM | POA: Diagnosis not present

## 2017-10-30 DIAGNOSIS — K219 Gastro-esophageal reflux disease without esophagitis: Secondary | ICD-10-CM | POA: Diagnosis not present

## 2017-10-31 DIAGNOSIS — K219 Gastro-esophageal reflux disease without esophagitis: Secondary | ICD-10-CM | POA: Diagnosis not present

## 2017-11-01 DIAGNOSIS — K219 Gastro-esophageal reflux disease without esophagitis: Secondary | ICD-10-CM | POA: Diagnosis not present

## 2017-11-02 DIAGNOSIS — K219 Gastro-esophageal reflux disease without esophagitis: Secondary | ICD-10-CM | POA: Diagnosis not present

## 2017-11-03 DIAGNOSIS — K219 Gastro-esophageal reflux disease without esophagitis: Secondary | ICD-10-CM | POA: Diagnosis not present

## 2017-11-04 DIAGNOSIS — K219 Gastro-esophageal reflux disease without esophagitis: Secondary | ICD-10-CM | POA: Diagnosis not present

## 2017-11-05 DIAGNOSIS — K219 Gastro-esophageal reflux disease without esophagitis: Secondary | ICD-10-CM | POA: Diagnosis not present

## 2017-11-06 DIAGNOSIS — K219 Gastro-esophageal reflux disease without esophagitis: Secondary | ICD-10-CM | POA: Diagnosis not present

## 2017-11-07 DIAGNOSIS — K219 Gastro-esophageal reflux disease without esophagitis: Secondary | ICD-10-CM | POA: Diagnosis not present

## 2017-11-08 DIAGNOSIS — K219 Gastro-esophageal reflux disease without esophagitis: Secondary | ICD-10-CM | POA: Diagnosis not present

## 2017-11-09 DIAGNOSIS — K219 Gastro-esophageal reflux disease without esophagitis: Secondary | ICD-10-CM | POA: Diagnosis not present

## 2017-11-10 DIAGNOSIS — K219 Gastro-esophageal reflux disease without esophagitis: Secondary | ICD-10-CM | POA: Diagnosis not present

## 2017-11-11 DIAGNOSIS — K219 Gastro-esophageal reflux disease without esophagitis: Secondary | ICD-10-CM | POA: Diagnosis not present

## 2017-11-12 DIAGNOSIS — K219 Gastro-esophageal reflux disease without esophagitis: Secondary | ICD-10-CM | POA: Diagnosis not present

## 2017-11-13 DIAGNOSIS — K219 Gastro-esophageal reflux disease without esophagitis: Secondary | ICD-10-CM | POA: Diagnosis not present

## 2017-11-14 DIAGNOSIS — K219 Gastro-esophageal reflux disease without esophagitis: Secondary | ICD-10-CM | POA: Diagnosis not present

## 2017-11-15 DIAGNOSIS — K219 Gastro-esophageal reflux disease without esophagitis: Secondary | ICD-10-CM | POA: Diagnosis not present

## 2017-11-16 DIAGNOSIS — K219 Gastro-esophageal reflux disease without esophagitis: Secondary | ICD-10-CM | POA: Diagnosis not present

## 2017-11-20 DIAGNOSIS — K219 Gastro-esophageal reflux disease without esophagitis: Secondary | ICD-10-CM | POA: Diagnosis not present

## 2017-11-21 DIAGNOSIS — K219 Gastro-esophageal reflux disease without esophagitis: Secondary | ICD-10-CM | POA: Diagnosis not present

## 2017-11-22 DIAGNOSIS — K219 Gastro-esophageal reflux disease without esophagitis: Secondary | ICD-10-CM | POA: Diagnosis not present

## 2017-11-23 DIAGNOSIS — K219 Gastro-esophageal reflux disease without esophagitis: Secondary | ICD-10-CM | POA: Diagnosis not present

## 2017-11-24 DIAGNOSIS — K219 Gastro-esophageal reflux disease without esophagitis: Secondary | ICD-10-CM | POA: Diagnosis not present

## 2017-11-25 DIAGNOSIS — K219 Gastro-esophageal reflux disease without esophagitis: Secondary | ICD-10-CM | POA: Diagnosis not present

## 2017-11-26 DIAGNOSIS — K219 Gastro-esophageal reflux disease without esophagitis: Secondary | ICD-10-CM | POA: Diagnosis not present

## 2017-11-27 DIAGNOSIS — K219 Gastro-esophageal reflux disease without esophagitis: Secondary | ICD-10-CM | POA: Diagnosis not present

## 2017-11-28 DIAGNOSIS — K219 Gastro-esophageal reflux disease without esophagitis: Secondary | ICD-10-CM | POA: Diagnosis not present

## 2017-11-29 DIAGNOSIS — K219 Gastro-esophageal reflux disease without esophagitis: Secondary | ICD-10-CM | POA: Diagnosis not present

## 2017-11-30 DIAGNOSIS — K219 Gastro-esophageal reflux disease without esophagitis: Secondary | ICD-10-CM | POA: Diagnosis not present

## 2017-12-01 DIAGNOSIS — K219 Gastro-esophageal reflux disease without esophagitis: Secondary | ICD-10-CM | POA: Diagnosis not present

## 2017-12-02 DIAGNOSIS — K219 Gastro-esophageal reflux disease without esophagitis: Secondary | ICD-10-CM | POA: Diagnosis not present

## 2017-12-03 DIAGNOSIS — K219 Gastro-esophageal reflux disease without esophagitis: Secondary | ICD-10-CM | POA: Diagnosis not present

## 2017-12-04 DIAGNOSIS — K219 Gastro-esophageal reflux disease without esophagitis: Secondary | ICD-10-CM | POA: Diagnosis not present

## 2017-12-05 DIAGNOSIS — K219 Gastro-esophageal reflux disease without esophagitis: Secondary | ICD-10-CM | POA: Diagnosis not present

## 2017-12-06 DIAGNOSIS — K219 Gastro-esophageal reflux disease without esophagitis: Secondary | ICD-10-CM | POA: Diagnosis not present

## 2017-12-07 DIAGNOSIS — K219 Gastro-esophageal reflux disease without esophagitis: Secondary | ICD-10-CM | POA: Diagnosis not present

## 2017-12-08 DIAGNOSIS — K219 Gastro-esophageal reflux disease without esophagitis: Secondary | ICD-10-CM | POA: Diagnosis not present

## 2017-12-09 DIAGNOSIS — K219 Gastro-esophageal reflux disease without esophagitis: Secondary | ICD-10-CM | POA: Diagnosis not present

## 2017-12-10 DIAGNOSIS — K219 Gastro-esophageal reflux disease without esophagitis: Secondary | ICD-10-CM | POA: Diagnosis not present

## 2017-12-11 DIAGNOSIS — K219 Gastro-esophageal reflux disease without esophagitis: Secondary | ICD-10-CM | POA: Diagnosis not present

## 2017-12-12 DIAGNOSIS — K219 Gastro-esophageal reflux disease without esophagitis: Secondary | ICD-10-CM | POA: Diagnosis not present

## 2017-12-13 DIAGNOSIS — K219 Gastro-esophageal reflux disease without esophagitis: Secondary | ICD-10-CM | POA: Diagnosis not present

## 2017-12-14 DIAGNOSIS — K219 Gastro-esophageal reflux disease without esophagitis: Secondary | ICD-10-CM | POA: Diagnosis not present

## 2017-12-15 DIAGNOSIS — K219 Gastro-esophageal reflux disease without esophagitis: Secondary | ICD-10-CM | POA: Diagnosis not present

## 2017-12-16 DIAGNOSIS — K219 Gastro-esophageal reflux disease without esophagitis: Secondary | ICD-10-CM | POA: Diagnosis not present

## 2017-12-17 DIAGNOSIS — K219 Gastro-esophageal reflux disease without esophagitis: Secondary | ICD-10-CM | POA: Diagnosis not present

## 2017-12-18 DIAGNOSIS — K219 Gastro-esophageal reflux disease without esophagitis: Secondary | ICD-10-CM | POA: Diagnosis not present

## 2017-12-19 DIAGNOSIS — K219 Gastro-esophageal reflux disease without esophagitis: Secondary | ICD-10-CM | POA: Diagnosis not present

## 2017-12-20 DIAGNOSIS — K219 Gastro-esophageal reflux disease without esophagitis: Secondary | ICD-10-CM | POA: Diagnosis not present

## 2017-12-21 DIAGNOSIS — K219 Gastro-esophageal reflux disease without esophagitis: Secondary | ICD-10-CM | POA: Diagnosis not present

## 2017-12-22 DIAGNOSIS — K219 Gastro-esophageal reflux disease without esophagitis: Secondary | ICD-10-CM | POA: Diagnosis not present

## 2017-12-23 DIAGNOSIS — K219 Gastro-esophageal reflux disease without esophagitis: Secondary | ICD-10-CM | POA: Diagnosis not present

## 2017-12-24 DIAGNOSIS — K219 Gastro-esophageal reflux disease without esophagitis: Secondary | ICD-10-CM | POA: Diagnosis not present

## 2017-12-25 DIAGNOSIS — K219 Gastro-esophageal reflux disease without esophagitis: Secondary | ICD-10-CM | POA: Diagnosis not present

## 2017-12-26 DIAGNOSIS — K219 Gastro-esophageal reflux disease without esophagitis: Secondary | ICD-10-CM | POA: Diagnosis not present

## 2017-12-27 DIAGNOSIS — K219 Gastro-esophageal reflux disease without esophagitis: Secondary | ICD-10-CM | POA: Diagnosis not present

## 2017-12-28 DIAGNOSIS — K219 Gastro-esophageal reflux disease without esophagitis: Secondary | ICD-10-CM | POA: Diagnosis not present

## 2017-12-29 DIAGNOSIS — K219 Gastro-esophageal reflux disease without esophagitis: Secondary | ICD-10-CM | POA: Diagnosis not present

## 2017-12-30 DIAGNOSIS — F17200 Nicotine dependence, unspecified, uncomplicated: Secondary | ICD-10-CM | POA: Diagnosis not present

## 2017-12-30 DIAGNOSIS — M5430 Sciatica, unspecified side: Secondary | ICD-10-CM | POA: Diagnosis not present

## 2017-12-30 DIAGNOSIS — K219 Gastro-esophageal reflux disease without esophagitis: Secondary | ICD-10-CM | POA: Diagnosis not present

## 2017-12-30 DIAGNOSIS — M545 Low back pain: Secondary | ICD-10-CM | POA: Diagnosis not present

## 2017-12-30 DIAGNOSIS — H6123 Impacted cerumen, bilateral: Secondary | ICD-10-CM | POA: Diagnosis not present

## 2017-12-31 DIAGNOSIS — K219 Gastro-esophageal reflux disease without esophagitis: Secondary | ICD-10-CM | POA: Diagnosis not present

## 2018-01-01 DIAGNOSIS — K219 Gastro-esophageal reflux disease without esophagitis: Secondary | ICD-10-CM | POA: Diagnosis not present

## 2018-01-02 DIAGNOSIS — K219 Gastro-esophageal reflux disease without esophagitis: Secondary | ICD-10-CM | POA: Diagnosis not present

## 2018-01-03 DIAGNOSIS — K219 Gastro-esophageal reflux disease without esophagitis: Secondary | ICD-10-CM | POA: Diagnosis not present

## 2018-01-04 DIAGNOSIS — K219 Gastro-esophageal reflux disease without esophagitis: Secondary | ICD-10-CM | POA: Diagnosis not present

## 2018-01-05 DIAGNOSIS — K219 Gastro-esophageal reflux disease without esophagitis: Secondary | ICD-10-CM | POA: Diagnosis not present

## 2018-01-06 DIAGNOSIS — K219 Gastro-esophageal reflux disease without esophagitis: Secondary | ICD-10-CM | POA: Diagnosis not present

## 2018-01-07 DIAGNOSIS — K219 Gastro-esophageal reflux disease without esophagitis: Secondary | ICD-10-CM | POA: Diagnosis not present

## 2018-01-08 DIAGNOSIS — K219 Gastro-esophageal reflux disease without esophagitis: Secondary | ICD-10-CM | POA: Diagnosis not present

## 2018-01-09 DIAGNOSIS — K219 Gastro-esophageal reflux disease without esophagitis: Secondary | ICD-10-CM | POA: Diagnosis not present

## 2018-01-10 DIAGNOSIS — K219 Gastro-esophageal reflux disease without esophagitis: Secondary | ICD-10-CM | POA: Diagnosis not present

## 2018-01-11 DIAGNOSIS — K219 Gastro-esophageal reflux disease without esophagitis: Secondary | ICD-10-CM | POA: Diagnosis not present

## 2018-01-12 DIAGNOSIS — K219 Gastro-esophageal reflux disease without esophagitis: Secondary | ICD-10-CM | POA: Diagnosis not present

## 2018-01-13 DIAGNOSIS — K219 Gastro-esophageal reflux disease without esophagitis: Secondary | ICD-10-CM | POA: Diagnosis not present

## 2018-01-14 DIAGNOSIS — K219 Gastro-esophageal reflux disease without esophagitis: Secondary | ICD-10-CM | POA: Diagnosis not present

## 2018-01-15 DIAGNOSIS — K219 Gastro-esophageal reflux disease without esophagitis: Secondary | ICD-10-CM | POA: Diagnosis not present

## 2018-01-16 DIAGNOSIS — K219 Gastro-esophageal reflux disease without esophagitis: Secondary | ICD-10-CM | POA: Diagnosis not present

## 2018-01-17 DIAGNOSIS — K219 Gastro-esophageal reflux disease without esophagitis: Secondary | ICD-10-CM | POA: Diagnosis not present

## 2018-01-18 DIAGNOSIS — K219 Gastro-esophageal reflux disease without esophagitis: Secondary | ICD-10-CM | POA: Diagnosis not present

## 2018-01-19 DIAGNOSIS — K219 Gastro-esophageal reflux disease without esophagitis: Secondary | ICD-10-CM | POA: Diagnosis not present

## 2018-01-20 DIAGNOSIS — M549 Dorsalgia, unspecified: Secondary | ICD-10-CM | POA: Diagnosis not present

## 2018-01-20 DIAGNOSIS — Z978 Presence of other specified devices: Secondary | ICD-10-CM | POA: Diagnosis not present

## 2018-01-20 DIAGNOSIS — K219 Gastro-esophageal reflux disease without esophagitis: Secondary | ICD-10-CM | POA: Diagnosis not present

## 2018-01-21 DIAGNOSIS — K219 Gastro-esophageal reflux disease without esophagitis: Secondary | ICD-10-CM | POA: Diagnosis not present

## 2018-01-22 DIAGNOSIS — K219 Gastro-esophageal reflux disease without esophagitis: Secondary | ICD-10-CM | POA: Diagnosis not present

## 2018-01-23 DIAGNOSIS — K219 Gastro-esophageal reflux disease without esophagitis: Secondary | ICD-10-CM | POA: Diagnosis not present

## 2018-01-24 DIAGNOSIS — K219 Gastro-esophageal reflux disease without esophagitis: Secondary | ICD-10-CM | POA: Diagnosis not present

## 2018-01-25 DIAGNOSIS — K219 Gastro-esophageal reflux disease without esophagitis: Secondary | ICD-10-CM | POA: Diagnosis not present

## 2018-01-26 DIAGNOSIS — K219 Gastro-esophageal reflux disease without esophagitis: Secondary | ICD-10-CM | POA: Diagnosis not present

## 2018-01-27 DIAGNOSIS — K219 Gastro-esophageal reflux disease without esophagitis: Secondary | ICD-10-CM | POA: Diagnosis not present

## 2018-01-28 DIAGNOSIS — K219 Gastro-esophageal reflux disease without esophagitis: Secondary | ICD-10-CM | POA: Diagnosis not present

## 2018-01-29 DIAGNOSIS — K219 Gastro-esophageal reflux disease without esophagitis: Secondary | ICD-10-CM | POA: Diagnosis not present

## 2018-01-30 DIAGNOSIS — K219 Gastro-esophageal reflux disease without esophagitis: Secondary | ICD-10-CM | POA: Diagnosis not present

## 2018-01-31 DIAGNOSIS — K219 Gastro-esophageal reflux disease without esophagitis: Secondary | ICD-10-CM | POA: Diagnosis not present

## 2018-02-01 DIAGNOSIS — K219 Gastro-esophageal reflux disease without esophagitis: Secondary | ICD-10-CM | POA: Diagnosis not present

## 2018-02-02 DIAGNOSIS — K219 Gastro-esophageal reflux disease without esophagitis: Secondary | ICD-10-CM | POA: Diagnosis not present

## 2018-02-03 DIAGNOSIS — K219 Gastro-esophageal reflux disease without esophagitis: Secondary | ICD-10-CM | POA: Diagnosis not present

## 2018-02-04 DIAGNOSIS — K219 Gastro-esophageal reflux disease without esophagitis: Secondary | ICD-10-CM | POA: Diagnosis not present

## 2018-02-05 DIAGNOSIS — K219 Gastro-esophageal reflux disease without esophagitis: Secondary | ICD-10-CM | POA: Diagnosis not present

## 2018-02-06 DIAGNOSIS — K219 Gastro-esophageal reflux disease without esophagitis: Secondary | ICD-10-CM | POA: Diagnosis not present

## 2018-02-07 DIAGNOSIS — K219 Gastro-esophageal reflux disease without esophagitis: Secondary | ICD-10-CM | POA: Diagnosis not present

## 2018-02-08 DIAGNOSIS — K219 Gastro-esophageal reflux disease without esophagitis: Secondary | ICD-10-CM | POA: Diagnosis not present

## 2018-02-09 DIAGNOSIS — K219 Gastro-esophageal reflux disease without esophagitis: Secondary | ICD-10-CM | POA: Diagnosis not present

## 2018-02-09 NOTE — Pre-Procedure Instructions (Signed)
[name]  02/09/2018      [id number] - [place], [place] - [address] AT [facility] [address] [place] [address] Phone: [phone] Fax: [phone]    Your procedure is scheduled on Feb 18, 2018.  Report to [facility] Admitting at 1100 AM.  Call this number if you have problems the morning of surgery:  [phone]   Remember:  No food or drink after midnight.  Continue all medications as directed by your physician except follow these medication instructions before surgery below    Take these medicines the morning of surgery with A SIP OF WATER  Albuterol inhaler-if needed (bring inhaler with you) diphenhydramine (benadryl)  7 days prior to surgery STOP taking any Aspirin (unless otherwise instructed by your surgeon), Aleve, Naproxen, Ibuprofen, Motrin, Advil, Goody's, BC's, all herbal medications, fish oil, and all vitamins   Do not wear jewelry, make-up or nail polish.  Do not wear lotions, powders, or perfumes, or deodorant.  Men may shave face and neck.  Do not bring valuables to the hospital.  [facility] is not responsible for any belongings or valuables.  Contacts, dentures or bridgework may not be worn into surgery.  Leave your suitcase in the car.  After surgery it may be brought to your room.  For patients admitted to the hospital, discharge time will be determined by your treatment team.  Patients discharged the day of surgery will not be allowed to drive home.   - Preparing For Surgery  Before surgery, you can play an important role. Because skin is not sterile, your skin needs to be as free of germs as possible. You can reduce the number of germs on your skin by washing with CHG (chlorahexidine gluconate) Soap before surgery.  CHG is an antiseptic cleaner which kills germs and bonds with the skin to continue killing germs even after washing.    Oral Hygiene is also important  to reduce your risk of infection.  Remember - BRUSH YOUR TEETH THE MORNING OF SURGERY WITH YOUR REGULAR TOOTHPASTE  Please do not use if you have an allergy to CHG or antibacterial soaps. If your skin becomes reddened/irritated stop using the CHG.  Do not shave (including legs and underarms) for at least 48 hours prior to first CHG shower. It is OK to shave your face.  Please follow these instructions carefully.   1. Shower the NIGHT BEFORE SURGERY and the MORNING OF SURGERY with CHG.   2. If you chose to wash your hair, wash your hair first as usual with your normal shampoo.  3. After you shampoo, rinse your hair and body thoroughly to remove the shampoo.  4. Use CHG as you would any other liquid soap. You can apply CHG directly to the skin and wash gently with a scrungie or a clean washcloth.   5. Apply the CHG Soap to your body ONLY FROM THE NECK DOWN.  Do not use on open wounds or open sores. Avoid contact with your eyes, ears, mouth and genitals (private parts). Wash Face and genitals (private parts)  with your normal soap.  6. Wash thoroughly, paying special attention to the area where your surgery will be performed.  7. Thoroughly rinse your body with warm water from the neck down.  8. DO NOT shower/wash with your normal soap after using and rinsing off the CHG Soap.  9. Pat yourself dry with a CLEAN TOWEL.  10.  Wear CLEAN PAJAMAS to bed the night before surgery, wear comfortable clothes the morning of surgery  11. Place CLEAN SHEETS on your bed the night of your first shower and DO NOT SLEEP WITH PETS.  Day of Surgery:  Do not apply any deodorants/lotions.  Please wear clean clothes to the hospital/surgery center.   Remember to brush your teeth WITH YOUR REGULAR TOOTHPASTE.  Please read over the following fact sheets that you were given. Pain Booklet, Coughing and Deep Breathing, MRSA Information and Surgical Site Infection Prevention

## 2018-02-10 ENCOUNTER — Inpatient Hospital Stay (HOSPITAL_COMMUNITY)
Admission: RE | Admit: 2018-02-10 | Discharge: 2018-02-10 | Disposition: A | Discharge status: Home / Self Care | Payer: Self-pay | Source: Ambulatory Visit

## 2018-02-10 DIAGNOSIS — K219 Gastro-esophageal reflux disease without esophagitis: Secondary | ICD-10-CM | POA: Diagnosis not present

## 2018-02-10 NOTE — Progress Notes (Signed)
Left message for [name] at Dr. Shon Baton office that patient was no show for PATappointment.

## 2018-02-11 DIAGNOSIS — K219 Gastro-esophageal reflux disease without esophagitis: Secondary | ICD-10-CM | POA: Diagnosis not present

## 2018-02-12 ENCOUNTER — Encounter (HOSPITAL_COMMUNITY): Payer: Self-pay | Admitting: Family Medicine

## 2018-02-12 ENCOUNTER — Ambulatory Visit (HOSPITAL_COMMUNITY)
Admission: EM | Admit: 2018-02-12 | Discharge: 2018-02-12 | Disposition: A | Discharge status: Home / Self Care | Payer: Medicare HMO | Attending: Family Medicine | Admitting: Family Medicine

## 2018-02-12 DIAGNOSIS — J209 Acute bronchitis, unspecified: Secondary | ICD-10-CM | POA: Diagnosis not present

## 2018-02-12 DIAGNOSIS — K219 Gastro-esophageal reflux disease without esophagitis: Secondary | ICD-10-CM | POA: Diagnosis not present

## 2018-02-12 DIAGNOSIS — I10 Essential (primary) hypertension: Secondary | ICD-10-CM

## 2018-02-12 MED ORDER — LOSARTAN POTASSIUM-HCTZ 100-25 MG PO TABS: 1.0000 | ORAL_TABLET | Freq: Every day | ORAL | 1 refills | Status: AC

## 2018-02-12 MED ORDER — AZITHROMYCIN 250 MG PO TABS: ORAL_TABLET | ORAL | 0 refills | Status: AC
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
## 2018-02-12 NOTE — Discharge Instructions (Signed)
Take blood pressure medicine every day.  May experience some malaise or fatigue initially, which is expected.

## 2018-02-12 NOTE — ED Triage Notes (Addendum)
Pt here for elevated BP. Was sent here by [facility]. He is also having a lot of back pain due to chronic back issues. He has a nerve stimulator that is being replaced next week. He is also complaining of sore throat, and coughing up mucous.
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
## 2018-02-13 DIAGNOSIS — K219 Gastro-esophageal reflux disease without esophagitis: Secondary | ICD-10-CM | POA: Diagnosis not present

## 2018-02-14 DIAGNOSIS — K219 Gastro-esophageal reflux disease without esophagitis: Secondary | ICD-10-CM | POA: Diagnosis not present

## 2018-02-15 DIAGNOSIS — K219 Gastro-esophageal reflux disease without esophagitis: Secondary | ICD-10-CM | POA: Diagnosis not present

## 2018-02-16 DIAGNOSIS — K219 Gastro-esophageal reflux disease without esophagitis: Secondary | ICD-10-CM | POA: Diagnosis not present

## 2018-02-17 ENCOUNTER — Encounter (HOSPITAL_COMMUNITY): Payer: Self-pay

## 2018-02-17 ENCOUNTER — Other Ambulatory Visit: Payer: Self-pay

## 2018-02-17 ENCOUNTER — Encounter (HOSPITAL_COMMUNITY)
Admission: RE | Admit: 2018-02-17 | Discharge: 2018-02-17 | Disposition: A | Discharge status: Home / Self Care | Payer: Medicare HMO | Source: Ambulatory Visit | Attending: Orthopedic Surgery | Admitting: Orthopedic Surgery

## 2018-02-17 DIAGNOSIS — Y753 Surgical instruments, materials and neurological devices (including sutures) associated with adverse incidents: Secondary | ICD-10-CM | POA: Diagnosis not present

## 2018-02-17 DIAGNOSIS — T85193A Other mechanical complication of implanted electronic neurostimulator, generator, initial encounter: Secondary | ICD-10-CM | POA: Diagnosis not present

## 2018-02-17 DIAGNOSIS — K219 Gastro-esophageal reflux disease without esophagitis: Secondary | ICD-10-CM | POA: Diagnosis not present

## 2018-02-17 DIAGNOSIS — J45909 Unspecified asthma, uncomplicated: Secondary | ICD-10-CM | POA: Diagnosis not present

## 2018-02-17 DIAGNOSIS — F17210 Nicotine dependence, cigarettes, uncomplicated: Secondary | ICD-10-CM | POA: Diagnosis not present

## 2018-02-17 DIAGNOSIS — G894 Chronic pain syndrome: Secondary | ICD-10-CM | POA: Diagnosis not present

## 2018-02-17 DIAGNOSIS — I10 Essential (primary) hypertension: Secondary | ICD-10-CM | POA: Diagnosis not present

## 2018-02-17 DIAGNOSIS — Z79899 Other long term (current) drug therapy: Secondary | ICD-10-CM | POA: Diagnosis not present

## 2018-02-17 LAB — CBC
HCT: 41.6 % (ref 39.0–52.0)
Hemoglobin: 13.7 g/dL (ref 13.0–17.0)
MCH: 30.4 pg (ref 26.0–34.0)
MCHC: 32.9 g/dL (ref 30.0–36.0)
MCV: 92.4 fL (ref 78.0–100.0)
Platelets: 284 K/uL (ref 150–400)
RBC: 4.5 MIL/uL (ref 4.22–5.81)
RDW: 13 % (ref 11.5–15.5)
WBC: 4.9 K/uL (ref 4.0–10.5)

## 2018-02-17 LAB — BASIC METABOLIC PANEL WITH GFR
Anion gap: 7 (ref 5–15)
BUN: 11 mg/dL (ref 6–20)
CO2: 25 mmol/L (ref 22–32)
Calcium: 9.2 mg/dL (ref 8.9–10.3)
Chloride: 106 mmol/L (ref 101–111)
Creatinine, Ser: 0.94 mg/dL (ref 0.61–1.24)
GFR calc Af Amer: >60 mL/min
GFR calc non Af Amer: >60 mL/min
Glucose, Bld: 100 mg/dL — ABNORMAL HIGH (ref 65–99)
Potassium: 3.9 mmol/L (ref 3.5–5.1)
Sodium: 138 mmol/L (ref 135–145)

## 2018-02-17 LAB — SURGICAL PCR SCREEN
MRSA, PCR: NEGATIVE
STAPHYLOCOCCUS AUREUS: NEGATIVE

## 2018-02-17 LAB — NO BLOOD PRODUCTS
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
## 2018-02-17 NOTE — Pre-Procedure Instructions (Signed)
[name]  02/17/2018      [id number] - [place], [place] - [address] AT [facility] [address] [place] [address] Phone: [phone] Fax: [phone]    Your procedure is scheduled on Thurs., Feb 18, 2018 from 12:15PM-1:15PM  Report to [facility] Admitting Entrance "A" at 10:15AM  Call this number if you have problems the morning of surgery:  [phone]   Remember:  No food or drinks after midnight on May 29th  Take these medicines the morning of surgery with A SIP OF WATER: If needed DiphenhydrAMINE (BENADRYL) for itching and Albuterol Inhaler for cough or wheezing (Bring with you the day of surgery)  As of today, stop taking all Other Aspirin Products, Vitamins, Fish oils, and Herbal medications. Also stop all NSAIDS i.e. Advil, Ibuprofen, Motrin, Aleve, Anaprox, Naproxen, BC and Goody Powders. Including: GOODY PM   Do not wear jewelry.  Do not wear lotions, powders, or perfumes, or deodorant.  Do not shave 48 hours prior to surgery.  Men may shave face.  Do not bring valuables to the hospital.  [facility] is not responsible for any belongings or valuables.  Contacts, dentures or bridgework may not be worn into surgery.  Leave your suitcase in the car.  After surgery it may be brought to your room.  For patients admitted to the hospital, discharge time will be determined by your treatment team.  Patients discharged the day of surgery will not be allowed to drive home.   Special instructions:   [place]- Preparing For Surgery  Before surgery, you can play an important role. Because skin is not sterile, your skin needs to be as free of germs as possible. You can reduce the number of germs on your skin by washing with CHG (chlorahexidine gluconate) Soap before surgery.  CHG is an antiseptic cleaner which kills germs and bonds with the skin to continue killing germs even after washing.    Oral Hygiene  is also important to reduce your risk of infection.  Remember - BRUSH YOUR TEETH THE MORNING OF SURGERY WITH YOUR REGULAR TOOTHPASTE  Please do not use if you have an allergy to CHG or antibacterial soaps. If your skin becomes reddened/irritated stop using the CHG.  Do not shave (including legs and underarms) for at least 48 hours prior to first CHG shower. It is OK to shave your face.  Please follow these instructions carefully.   1. Shower the NIGHT BEFORE SURGERY and the MORNING OF SURGERY with CHG.   2. If you chose to wash your hair, wash your hair first as usual with your normal shampoo.  3. After you shampoo, rinse your hair and body thoroughly to remove the shampoo.  4. Use CHG as you would any other liquid soap. You can apply CHG directly to the skin and wash gently with a scrungie or a clean washcloth.   5. Apply the CHG Soap to your body ONLY FROM THE NECK DOWN.  Do not use on open wounds or open sores. Avoid contact with your eyes, ears, mouth and genitals (private parts). Wash Face and genitals (private parts)  with your normal soap.  6. Wash thoroughly, paying special attention to the area where your surgery will be performed.  7. Thoroughly rinse your body with warm water from the neck down.  8. DO NOT shower/wash with your normal soap after using and rinsing off the CHG Soap.  9. Dennie Bible  yourself dry with a CLEAN TOWEL.  10. Wear CLEAN PAJAMAS to bed the night before surgery, wear comfortable clothes the morning of surgery  11. Place CLEAN SHEETS on your bed the night of your first shower and DO NOT SLEEP WITH PETS.  Day of Surgery:  Do not apply any deodorants/lotions.  Please wear clean clothes to the hospital/surgery center.   Remember to brush your teeth WITH YOUR REGULAR TOOTHPASTE.  Please read over the following fact sheets that you were given. Pain Booklet, Coughing and Deep Breathing, MRSA Information and Surgical Site Infection Prevention

## 2018-02-17 NOTE — Progress Notes (Signed)
PCP - Dr. Rella Larve  Cardiologist - Denies  Chest x-ray - Denies  EKG - 02/17/18  Stress Test - Denies  ECHO - Denies  Cardiac Cath - Denies  Sleep Study - Denies CPAP - None  LABS- 02/17/18: CBC, BMP  ASA- Denies   Anesthesia- Yes- surgical orders  Pt denies having chest pain, sob, or fever at this time. All instructions explained to the pt, with a verbal understanding of the material. Pt agrees to go over the instructions while at home for a better understanding. The opportunity to ask questions was provided.

## 2018-02-18 ENCOUNTER — Ambulatory Visit (HOSPITAL_COMMUNITY): Payer: Medicare HMO | Admitting: Emergency Medicine

## 2018-02-18 ENCOUNTER — Ambulatory Visit (HOSPITAL_COMMUNITY): Payer: Medicare HMO | Admitting: Anesthesiology

## 2018-02-18 ENCOUNTER — Encounter (HOSPITAL_COMMUNITY)
Admission: RE | Disposition: A | Discharge status: Home / Self Care | Payer: Self-pay | Source: Ambulatory Visit | Attending: Orthopedic Surgery

## 2018-02-18 ENCOUNTER — Encounter (HOSPITAL_COMMUNITY): Payer: Self-pay | Admitting: *Deleted

## 2018-02-18 ENCOUNTER — Ambulatory Visit (HOSPITAL_COMMUNITY)
Admission: RE | Admit: 2018-02-18 | Discharge: 2018-02-18 | Disposition: A | Discharge status: Home / Self Care | Payer: Medicare HMO | Source: Ambulatory Visit | Attending: Orthopedic Surgery | Admitting: Orthopedic Surgery

## 2018-02-18 ENCOUNTER — Other Ambulatory Visit: Payer: Self-pay

## 2018-02-18 DIAGNOSIS — M5432 Sciatica, left side: Secondary | ICD-10-CM | POA: Diagnosis not present

## 2018-02-18 DIAGNOSIS — Y753 Surgical instruments, materials and neurological devices (including sutures) associated with adverse incidents: Secondary | ICD-10-CM | POA: Insufficient documentation

## 2018-02-18 DIAGNOSIS — I10 Essential (primary) hypertension: Secondary | ICD-10-CM | POA: Diagnosis not present

## 2018-02-18 DIAGNOSIS — F17210 Nicotine dependence, cigarettes, uncomplicated: Secondary | ICD-10-CM | POA: Insufficient documentation

## 2018-02-18 DIAGNOSIS — T85193A Other mechanical complication of implanted electronic neurostimulator, generator, initial encounter: Secondary | ICD-10-CM | POA: Diagnosis not present

## 2018-02-18 DIAGNOSIS — J45909 Unspecified asthma, uncomplicated: Secondary | ICD-10-CM | POA: Insufficient documentation

## 2018-02-18 DIAGNOSIS — Z462 Encounter for fitting and adjustment of other devices related to nervous system and special senses: Secondary | ICD-10-CM | POA: Diagnosis not present

## 2018-02-18 DIAGNOSIS — Z79899 Other long term (current) drug therapy: Secondary | ICD-10-CM | POA: Insufficient documentation

## 2018-02-18 DIAGNOSIS — G894 Chronic pain syndrome: Secondary | ICD-10-CM | POA: Insufficient documentation

## 2018-02-18 DIAGNOSIS — K219 Gastro-esophageal reflux disease without esophagitis: Secondary | ICD-10-CM | POA: Diagnosis not present

## 2018-02-18 HISTORY — PX: SPINAL CORD STIMULATOR BATTERY EXCHANGE: SHX6202

## 2018-02-18 SURGERY — SPINAL CORD STIMULATOR BATTERY EXCHANGE
Anesthesia: General | Site: Back

## 2018-02-18 MED ORDER — FENTANYL CITRATE (PF) 100 MCG/2ML IJ SOLN
INTRAMUSCULAR | Status: DC | PRN
  Administered 2018-02-18: 100 ug via INTRAVENOUS

## 2018-02-18 MED ORDER — ROCURONIUM BROMIDE 100 MG/10ML IV SOLN
INTRAVENOUS | Status: DC | PRN
  Administered 2018-02-18: 50 mg via INTRAVENOUS

## 2018-02-18 MED ORDER — ARTIFICIAL TEARS OPHTHALMIC OINT
TOPICAL_OINTMENT | OPHTHALMIC | Status: AC
  Filled 2018-02-18: qty 3.5

## 2018-02-18 MED ORDER — MIDAZOLAM HCL 5 MG/5ML IJ SOLN
INTRAMUSCULAR | Status: DC | PRN
  Administered 2018-02-18: 2 mg via INTRAVENOUS

## 2018-02-18 MED ORDER — BUPIVACAINE LIPOSOME 1.3 % IJ SUSP
INTRAMUSCULAR | Status: DC | PRN
  Administered 2018-02-18: 20 mL

## 2018-02-18 MED ORDER — BUPIVACAINE-EPINEPHRINE 0.25% -1:200000 IJ SOLN
INTRAMUSCULAR | Status: DC | PRN
  Administered 2018-02-18: 30 mL

## 2018-02-18 MED ORDER — LACTATED RINGERS IV SOLN
INTRAVENOUS | Status: DC
  Administered 2018-02-18: 10:00:00 via INTRAVENOUS

## 2018-02-18 MED ORDER — MORPHINE SULFATE (PF) 4 MG/ML IV SOLN
INTRAVENOUS | Status: AC
  Administered 2018-02-18: 2 mg
  Filled 2018-02-18: qty 1

## 2018-02-18 MED ORDER — OXYCODONE HCL 5 MG/5ML PO SOLN: 5.0000 mg | Freq: Once | ORAL | Status: DC | PRN

## 2018-02-18 MED ORDER — ONDANSETRON HCL 4 MG/2ML IJ SOLN
INTRAMUSCULAR | Status: AC
  Filled 2018-02-18: qty 2

## 2018-02-18 MED ORDER — BUPIVACAINE LIPOSOME 1.3 % IJ SUSP
20.0000 mL | Freq: Once | INTRAMUSCULAR | Status: DC
  Filled 2018-02-18: qty 20

## 2018-02-18 MED ORDER — SUGAMMADEX SODIUM 200 MG/2ML IV SOLN
INTRAVENOUS | Status: AC
  Filled 2018-02-18: qty 2

## 2018-02-18 MED ORDER — 0.9 % SODIUM CHLORIDE (POUR BTL) OPTIME
TOPICAL | Status: DC | PRN
  Administered 2018-02-18: 1000 mL

## 2018-02-18 MED ORDER — MIDAZOLAM HCL 2 MG/2ML IJ SOLN
INTRAMUSCULAR | Status: AC
  Filled 2018-02-18: qty 2

## 2018-02-18 MED ORDER — CEFAZOLIN SODIUM-DEXTROSE 2-4 GM/100ML-% IV SOLN
INTRAVENOUS | Status: AC
  Filled 2018-02-18: qty 100

## 2018-02-18 MED ORDER — ROCURONIUM BROMIDE 10 MG/ML (PF) SYRINGE
PREFILLED_SYRINGE | INTRAVENOUS | Status: AC
  Filled 2018-02-18: qty 5

## 2018-02-18 MED ORDER — MORPHINE SULFATE (PF) 2 MG/ML IV SOLN: 2.0000 mg | INTRAVENOUS | Status: AC

## 2018-02-18 MED ORDER — SUGAMMADEX SODIUM 200 MG/2ML IV SOLN
INTRAVENOUS | Status: DC | PRN
  Administered 2018-02-18: 200 mg via INTRAVENOUS

## 2018-02-18 MED ORDER — LACTATED RINGERS IV SOLN
INTRAVENOUS | Status: DC | PRN
  Administered 2018-02-18 (×2): via INTRAVENOUS

## 2018-02-18 MED ORDER — LIDOCAINE 2% (20 MG/ML) 5 ML SYRINGE
INTRAMUSCULAR | Status: AC
  Filled 2018-02-18: qty 5

## 2018-02-18 MED ORDER — CEFAZOLIN SODIUM-DEXTROSE 2-4 GM/100ML-% IV SOLN
2.0000 g | INTRAVENOUS | Status: AC
  Administered 2018-02-18: 2 g via INTRAVENOUS

## 2018-02-18 MED ORDER — OXYCODONE HCL 5 MG PO TABS: 5.0000 mg | ORAL_TABLET | Freq: Once | ORAL | Status: DC | PRN

## 2018-02-18 MED ORDER — ONDANSETRON HCL 4 MG/2ML IJ SOLN
INTRAMUSCULAR | Status: DC | PRN
  Administered 2018-02-18: 4 mg via INTRAVENOUS

## 2018-02-18 MED ORDER — FENTANYL CITRATE (PF) 250 MCG/5ML IJ SOLN
INTRAMUSCULAR | Status: AC
  Filled 2018-02-18: qty 5

## 2018-02-18 MED ORDER — PHENYLEPHRINE HCL 10 MG/ML IJ SOLN
INTRAVENOUS | Status: DC | PRN
  Administered 2018-02-18: 25 ug/min via INTRAVENOUS

## 2018-02-18 MED ORDER — HYDROCODONE-ACETAMINOPHEN 5-325 MG PO TABS: 1.0000 | ORAL_TABLET | Freq: Four times a day (QID) | ORAL | 0 refills | Status: AC | PRN

## 2018-02-18 MED ORDER — ONDANSETRON HCL 4 MG/2ML IJ SOLN: 4.0000 mg | Freq: Four times a day (QID) | INTRAMUSCULAR | Status: DC | PRN

## 2018-02-18 MED ORDER — DEXAMETHASONE SODIUM PHOSPHATE 10 MG/ML IJ SOLN
INTRAMUSCULAR | Status: AC
  Filled 2018-02-18: qty 1

## 2018-02-18 MED ORDER — KETOROLAC TROMETHAMINE 30 MG/ML IJ SOLN
INTRAMUSCULAR | Status: DC | PRN
  Administered 2018-02-18: 30 mg via INTRAVENOUS

## 2018-02-18 MED ORDER — LIDOCAINE HCL (CARDIAC) PF 100 MG/5ML IV SOSY
PREFILLED_SYRINGE | INTRAVENOUS | Status: DC | PRN
  Administered 2018-02-18: 100 mg via INTRAVENOUS

## 2018-02-18 MED ORDER — BUPIVACAINE-EPINEPHRINE (PF) 0.25% -1:200000 IJ SOLN
INTRAMUSCULAR | Status: AC
  Filled 2018-02-18: qty 30

## 2018-02-18 MED ORDER — ONDANSETRON 4 MG PO TBDP: 4.0000 mg | ORAL_TABLET | Freq: Three times a day (TID) | ORAL | 0 refills | Status: AC | PRN

## 2018-02-18 MED ORDER — PROPOFOL 10 MG/ML IV BOLUS
INTRAVENOUS | Status: DC | PRN
  Administered 2018-02-18: 170 mg via INTRAVENOUS

## 2018-02-18 MED ORDER — FENTANYL CITRATE (PF) 100 MCG/2ML IJ SOLN: 25.0000 ug | INTRAMUSCULAR | Status: DC | PRN

## 2018-02-18 MED ORDER — PHENYLEPHRINE 40 MCG/ML (10ML) SYRINGE FOR IV PUSH (FOR BLOOD PRESSURE SUPPORT)
PREFILLED_SYRINGE | INTRAVENOUS | Status: DC | PRN
  Administered 2018-02-18: 80 ug via INTRAVENOUS
  Administered 2018-02-18: 120 ug via INTRAVENOUS

## 2018-02-18 SURGICAL SUPPLY — 55 items
CABLE MULTI-LEAD TRIAL SPINE (MISCELLANEOUS) ×2 IMPLANT
CANISTER SUCT 3000ML PPV (MISCELLANEOUS) ×3 IMPLANT
CLOSURE STERI-STRIP 1/2X4 (GAUZE/BANDAGES/DRESSINGS) ×1
CLOSURE WOUND 1/2 X4 (GAUZE/BANDAGES/DRESSINGS) ×1
CLSR STERI-STRIP ANTIMIC 1/2X4 (GAUZE/BANDAGES/DRESSINGS) ×1 IMPLANT
CORDS BIPOLAR (ELECTRODE) ×3 IMPLANT
DRAPE INCISE IOBAN 66X45 STRL (DRAPES) ×5 IMPLANT
DRAPE LAPAROTOMY 100X72 PEDS (DRAPES) ×3 IMPLANT
DRAPE PED LAPAROTOMY (DRAPES) ×3 IMPLANT
DRAPE POUCH INSTRU U-SHP 10X18 (DRAPES) ×3 IMPLANT
DRAPE SURG 17X23 STRL (DRAPES) ×3 IMPLANT
DRAPE U-SHAPE 47X51 STRL (DRAPES) ×3 IMPLANT
DRSG AQUACEL AG ADV 3.5X 6 (GAUZE/BANDAGES/DRESSINGS) ×3 IMPLANT
DRSG OPSITE POSTOP 4X6 (GAUZE/BANDAGES/DRESSINGS) ×2 IMPLANT
DURAPREP 26ML APPLICATOR (WOUND CARE) ×3 IMPLANT
ELECT CAUTERY BLADE 6.4 (BLADE) ×3 IMPLANT
ELECT PENCIL ROCKER SW 15FT (MISCELLANEOUS) ×3 IMPLANT
ELECT REM PT RETURN 9FT ADLT (ELECTROSURGICAL) ×3
ELECTRODE REM PT RTRN 9FT ADLT (ELECTROSURGICAL) ×1 IMPLANT
GENERATOR PULSE PROCLAIM 5ELIT (Neuro Prosthesis/Implant) IMPLANT
GLOVE BIO SURGEON STRL SZ 6.5 (GLOVE) ×2 IMPLANT
GLOVE BIO SURGEONS STRL SZ 6.5 (GLOVE) ×1
GLOVE BIOGEL PI IND STRL 6.5 (GLOVE) ×1 IMPLANT
GLOVE BIOGEL PI IND STRL 8.5 (GLOVE) ×1 IMPLANT
GLOVE BIOGEL PI INDICATOR 6.5 (GLOVE) ×2
GLOVE BIOGEL PI INDICATOR 8.5 (GLOVE) ×2
GLOVE SS BIOGEL STRL SZ 8.5 (GLOVE) ×2 IMPLANT
GLOVE SUPERSENSE BIOGEL SZ 8.5 (GLOVE) ×4
GOWN STRL REUS W/ TWL LRG LVL3 (GOWN DISPOSABLE) ×2 IMPLANT
GOWN STRL REUS W/TWL 2XL LVL3 (GOWN DISPOSABLE) ×3 IMPLANT
GOWN STRL REUS W/TWL LRG LVL3 (GOWN DISPOSABLE) ×6
KIT BASIN OR (CUSTOM PROCEDURE TRAY) ×3 IMPLANT
KIT TURNOVER KIT B (KITS) ×3 IMPLANT
NDL SUT 6 .5 CRC .975X.05 MAYO (NEEDLE) ×1 IMPLANT
NEEDLE 22X1 1/2 (OR ONLY) (NEEDLE) IMPLANT
NEEDLE MAYO TAPER (NEEDLE) ×3
NS IRRIG 1000ML POUR BTL (IV SOLUTION) ×3 IMPLANT
PACK GENERAL/GYN (CUSTOM PROCEDURE TRAY) ×3 IMPLANT
PACK UNIVERSAL I (CUSTOM PROCEDURE TRAY) ×3 IMPLANT
PAD ARMBOARD 7.5X6 YLW CONV (MISCELLANEOUS) ×6 IMPLANT
PULSE GENERATOR PROCLAIM 5ELIT (Neuro Prosthesis/Implant) ×3 IMPLANT
SPONGE LAP 4X18 RFD (DISPOSABLE) ×3 IMPLANT
STAPLER VISISTAT 35W (STAPLE) IMPLANT
STRIP CLOSURE SKIN 1/2X4 (GAUZE/BANDAGES/DRESSINGS) ×2 IMPLANT
SUT ETHIBOND NAB CT1 #1 30IN (SUTURE) ×6 IMPLANT
SUT MON AB 3-0 SH 27 (SUTURE) ×3
SUT MON AB 3-0 SH27 (SUTURE) ×1 IMPLANT
SUT VIC AB 1 CT1 18XCR BRD 8 (SUTURE) ×1 IMPLANT
SUT VIC AB 1 CT1 8-18 (SUTURE) ×3
SUT VIC AB 2-0 CT1 18 (SUTURE) IMPLANT
SYR BULB IRRIGATION 50ML (SYRINGE) ×3 IMPLANT
SYR CONTROL 10ML LL (SYRINGE) IMPLANT
TOWEL OR 17X24 6PK STRL BLUE (TOWEL DISPOSABLE) ×6 IMPLANT
TOWEL OR 17X26 10 PK STRL BLUE (TOWEL DISPOSABLE) ×3 IMPLANT
WATER STERILE IRR 1000ML POUR (IV SOLUTION) ×3 IMPLANT

## 2018-02-18 NOTE — Discharge Instructions (Signed)
Lumbar Diskectomy, Care After °Refer to this sheet in the next few weeks. These instructions provide you with information about caring for yourself after your procedure. Your health care provider may also give you more specific instructions. Your treatment has been planned according to current medical practices, but problems sometimes occur. Call your health care provider if you have any problems or questions after your procedure. °What can I expect after the procedure? °After the procedure, it is common to have: °· Pain. °· Numbness. °· Weakness. ° °Follow these instructions at home: °Medicines °· Take medicines only as directed by your health care provider. °· If you were prescribed an antibiotic medicine, finish all of it even if you start to feel better. °Incision care °· There are many different ways to close and cover an incision, including stitches (sutures), skin glue, and adhesive strips. Follow your health care provider's instructions about: °? Incision care. °? Bandage (dressing) changes and removal. °? Incision closure removal. °· Check your incision area every day for signs of infection. If you cannot see your incision, have someone check it for you. Watch for: °? Redness, swelling, or pain. °? Fluid, blood, or pus. °Activity °· Avoid sitting for longer than 20 minutes at a time or as directed by your health care provider. °· Do not climb stairs more than once each day until your health care provider approves. °· Do not bend at your waist. To pick things up, bend your knees. °· Do not lift anything that is heavier than 10 lb (4.5 kg) or as directed by your health care provider. °· Do not drive a car until your health care provider approves. °· Ask your health care provider when you may return to your normal activities, such as playing sports and going back to work. °· Work with your physical therapist to learn safe movement and exercises to help healing. Do these exercises as directed. °· Take short  walks often. °General instructions °· Do not use any tobacco products, including cigarettes, chewing tobacco, or electronic cigarettes. If you need help quitting, ask your health care provider. °· Follow your health care provider’s instructions about bathing. Do not take baths, shower, swim, or use a hot tub until your health care provider approves. °· Wear your back brace as directed by your health care provider. °· To prevent constipation: °? Drink enough fluid to keep your urine clear or pale yellow. °? Eat plenty of fruits, vegetables, and whole grains. °· Keep all follow-up visits as directed by your health care provider. This is important. This includes any follow-up visits with your physical therapist. °Contact a health care provider if: °· You have a fever. °· You have redness, swelling, or pain in your incision area. °· Your pain is not controlled with medicine. °· You have pain, numbness, or weakness that lasts longer than three weeks after surgery. °· You become constipated. °Get help right away if: °· You have fluid, blood, or pus coming from your incision. °· You have increasing pain, numbness, or weakness. °· You lose control of when you urinate or have a bowel movement (incontinence). °· You have chest pain. °· You have trouble breathing. °This information is not intended to replace advice given to you by your health care provider. Make sure you discuss any questions you have with your health care provider. °Document Released: 08/13/2004 Document Revised: 02/14/2016 Document Reviewed: 05/03/2014 °Elsevier Interactive Patient Education © 2018 [employer] ° °

## 2018-02-18 NOTE — Transfer of Care (Signed)
Immediate Anesthesia Transfer of Care Note  Patient: Rigby Leonhardt  Procedure(s) Performed: SPINAL CORD STIMULATOR BATTERY EXCHANGE (Replace) (N/A Back)  Patient Location: PACU  Anesthesia Type:General  Level of Consciousness: awake, alert  and oriented  Airway & Oxygen Therapy: Patient Spontanous Breathing and Patient connected to nasal cannula oxygen  Post-op Assessment: Report given to RN, Post -op Vital signs reviewed and stable and Patient moving all extremities X 4  Post vital signs: Reviewed and stable  Last Vitals:  Vitals Value Taken Time  BP 131/102 02/18/2018  1:13 PM  Temp 36.4 C 02/18/2018  1:13 PM  Pulse 87 02/18/2018  1:15 PM  Resp 15 02/18/2018  1:15 PM  SpO2 100 % 02/18/2018  1:15 PM  Vitals shown include unvalidated device data.  Last Pain:  Vitals:   02/18/18 1313  TempSrc:   PainSc: 0-No pain         Complications: No apparent anesthesia complications

## 2018-02-18 NOTE — Progress Notes (Signed)
DC instructions reviewed with patient and sister, paper prescriptions given to sister, no further questions from pt or family.  Hermina Barters, RN

## 2018-02-18 NOTE — Brief Op Note (Signed)
02/18/2018  12:59 PM  PATIENT:  [name]  52 y.o. male  PRE-OPERATIVE DIAGNOSIS:  Failed Spinal Cord Stimulator Battery  POST-OPERATIVE DIAGNOSIS:  Failed Spinal Cord Stimulator Battery  PROCEDURE:  Procedure(s) with comments: SPINAL CORD STIMULATOR BATTERY EXCHANGE (Replace) (N/A) - 60 mins  SURGEON:  Surgeon(s) and Role:    Venita Lick, MD - Primary  PHYSICIAN ASSISTANT:   ASSISTANTS: none   ANESTHESIA:   general  EBL:  none   BLOOD ADMINISTERED:none  DRAINS: none   LOCAL MEDICATIONS USED:  MARCAINE    and OTHER exparel  SPECIMEN:  No Specimen  DISPOSITION OF SPECIMEN:  N/A  COUNTS:  YES  TOURNIQUET:  * No tourniquets in log *  DICTATION: .Dragon Dictation  PLAN OF CARE: Discharge to home after PACU  PATIENT DISPOSITION:  PACU - hemodynamically stable.

## 2018-02-18 NOTE — Anesthesia Procedure Notes (Signed)
Procedure Name: Intubation Date/Time: 02/18/2018 12:01 PM Performed by: Kyung Rudd, CRNA Pre-anesthesia Checklist: Patient identified, Emergency Drugs available, Suction available and Patient being monitored Patient Re-evaluated:Patient Re-evaluated prior to induction Oxygen Delivery Method: Circle system utilized Preoxygenation: Pre-oxygenation with 100% oxygen Induction Type: IV induction Ventilation: Mask ventilation without difficulty Laryngoscope Size: Mac and 4 Grade View: Grade I Tube type: Oral Tube size: 7.5 mm Number of attempts: 1 Placement Confirmation: ETT inserted through vocal cords under direct vision,  positive ETCO2 and breath sounds checked- equal and bilateral Secured at: 22 cm Tube secured with: Tape Dental Injury: Teeth and Oropharynx as per pre-operative assessment

## 2018-02-18 NOTE — Anesthesia Preprocedure Evaluation (Signed)
Anesthesia Evaluation  Patient identified by MRN, date of birth, ID band Patient awake    Reviewed: Allergy & Precautions, H&P , NPO status , Patient's Chart, lab work & pertinent test results  Airway Mallampati: II   Neck ROM: full    Dental   Pulmonary asthma , Current Smoker,    breath sounds clear to auscultation       Cardiovascular hypertension,  Rhythm:regular Rate:Normal     Neuro/Psych PSYCHIATRIC DISORDERS  Neuromuscular disease    GI/Hepatic (+)     substance abuse  alcohol use,   Endo/Other    Renal/GU      Musculoskeletal   Abdominal   Peds  Hematology   Anesthesia Other Findings   Reproductive/Obstetrics                             Anesthesia Physical Anesthesia Plan  ASA: II  Anesthesia Plan: General   Post-op Pain Management:    Induction: Intravenous  PONV Risk Score and Plan: 1 and Ondansetron, Dexamethasone, Midazolam and Treatment may vary due to age or medical condition  Airway Management Planned: Oral ETT  Additional Equipment:   Intra-op Plan:   Post-operative Plan: Extubation in OR  Informed Consent: I have reviewed the patients History and Physical, chart, labs and discussed the procedure including the risks, benefits and alternatives for the proposed anesthesia with the patient or authorized representative who has indicated his/her understanding and acceptance.     Plan Discussed with: CRNA, Anesthesiologist and Surgeon  Anesthesia Plan Comments:         Anesthesia Quick Evaluation

## 2018-02-18 NOTE — Op Note (Signed)
Operative report  Preoperative diagnosis: Failed spinal cord stimulator battery  Postoperative diagnosis: Same  Operative procedure: Spinal cord stimulator battery replacement  Implant: [employer] spine proclaim 5 battery.  Complications: None  Indications: This is a very pleasant 52 year old gentleman who had a previous spinal cord stimulator placement and has done exceptionally well with it.  However the last year the battery has failed and he has increased overall pain.  He was ultimately referred to me to have the battery exchanged.  All appropriate risks benefits and alternatives to surgery were discussed with the patient and consent was obtained.  Operative report  Patient was brought the operating room.  After successful induction of general anesthesia endotracheal the patient teds SCDs were applied and he was turned prone onto a Wilson frame.  All bony prominences well-padded and the back was prepped and draped in a standard fashion.  Timeout was then taken to confirm patient procedure and all other important data.  I marked out the previous battery incision site and this was infiltrated with a combination of quarter percent Marcaine with epinephrine along with Exparel.  Incision was made sharply dissected down to the battery.  I exposed the battery and delivered out of the wound.  I separated the leads.  I then directly tested the leads.  According to the representative there was only one high impedance channel but the rest of the leads were functioning appropriately.  As a result we elected to proceed with replacing the battery.  I created a new pocket for the battery by dissecting inferiorly from his previous pocket site so that it would be better situated.  I then opened the new battery and secured the new leads in and torqued them off according to manufacturer standards.  The excess lead was wrapped and placed underneath the battery and the battery was positioned in the new pocket.  I  secured this to the deep fascia with two #1 Ethibond sutures.  Once the battery secured I irrigated the wound copiously with normal saline.  I then closed the deep fascia with interrupted #1 Vicryl suture superficial 2-0 Vicryl suture and a 3-0 Monocryl for the skin.  Steri-Strips and a dry dressing were applied and the patient was ultimately extubated transferred to PACU the incident.  Prior to placing the drain I did inject the remaining core percent Marcaine with Exparel for postoperative analgesia.  Patient will be discharged from the PACU today with hydrocodone for pain control and Zofran for nausea.  He will follow-up me in 2 weeks for wound check.  Patient will be cleared to shower in 5 days.  He knows to rest and recover and frequently use ice over the incision site to prevent swelling.

## 2018-02-19 ENCOUNTER — Encounter (HOSPITAL_COMMUNITY): Payer: Self-pay | Admitting: Orthopedic Surgery

## 2018-02-19 DIAGNOSIS — K219 Gastro-esophageal reflux disease without esophagitis: Secondary | ICD-10-CM | POA: Diagnosis not present

## 2018-02-19 NOTE — Anesthesia Postprocedure Evaluation (Signed)
Anesthesia Post Note  Patient: Baruch GoldmannCarl Lehn  Procedure(s) Performed: SPINAL CORD STIMULATOR BATTERY EXCHANGE (Replace) (N/A Back)     Patient location during evaluation: PACU Anesthesia Type: General Level of consciousness: awake and alert Pain management: pain level controlled Vital Signs Assessment: post-procedure vital signs reviewed and stable Respiratory status: spontaneous breathing, nonlabored ventilation, respiratory function stable and patient connected to nasal cannula oxygen Cardiovascular status: blood pressure returned to baseline and stable Postop Assessment: no apparent nausea or vomiting Anesthetic complications: no    Last Vitals:  Vitals:   02/18/18 1401 02/18/18 1412  BP:  119/89  Pulse:    Resp:    Temp: (!) 36.3 C   SpO2:  96%    Last Pain:  Vitals:   02/18/18 1430  TempSrc:   PainSc: 0-No pain                 Margherita Collyer S

## 2018-02-20 DIAGNOSIS — K219 Gastro-esophageal reflux disease without esophagitis: Secondary | ICD-10-CM | POA: Diagnosis not present

## 2018-02-21 DIAGNOSIS — K219 Gastro-esophageal reflux disease without esophagitis: Secondary | ICD-10-CM | POA: Diagnosis not present

## 2018-02-22 DIAGNOSIS — K219 Gastro-esophageal reflux disease without esophagitis: Secondary | ICD-10-CM | POA: Diagnosis not present

## 2018-02-23 DIAGNOSIS — K219 Gastro-esophageal reflux disease without esophagitis: Secondary | ICD-10-CM | POA: Diagnosis not present

## 2018-02-24 DIAGNOSIS — K219 Gastro-esophageal reflux disease without esophagitis: Secondary | ICD-10-CM | POA: Diagnosis not present

## 2018-02-25 DIAGNOSIS — K219 Gastro-esophageal reflux disease without esophagitis: Secondary | ICD-10-CM | POA: Diagnosis not present

## 2018-02-26 DIAGNOSIS — K219 Gastro-esophageal reflux disease without esophagitis: Secondary | ICD-10-CM | POA: Diagnosis not present

## 2018-02-27 DIAGNOSIS — K219 Gastro-esophageal reflux disease without esophagitis: Secondary | ICD-10-CM | POA: Diagnosis not present

## 2018-02-28 DIAGNOSIS — K219 Gastro-esophageal reflux disease without esophagitis: Secondary | ICD-10-CM | POA: Diagnosis not present

## 2018-03-01 DIAGNOSIS — K219 Gastro-esophageal reflux disease without esophagitis: Secondary | ICD-10-CM | POA: Diagnosis not present

## 2018-03-02 DIAGNOSIS — K219 Gastro-esophageal reflux disease without esophagitis: Secondary | ICD-10-CM | POA: Diagnosis not present

## 2018-03-03 DIAGNOSIS — K219 Gastro-esophageal reflux disease without esophagitis: Secondary | ICD-10-CM | POA: Diagnosis not present

## 2018-03-04 DIAGNOSIS — K219 Gastro-esophageal reflux disease without esophagitis: Secondary | ICD-10-CM | POA: Diagnosis not present

## 2018-03-05 DIAGNOSIS — K219 Gastro-esophageal reflux disease without esophagitis: Secondary | ICD-10-CM | POA: Diagnosis not present

## 2018-03-06 ENCOUNTER — Other Ambulatory Visit: Payer: Self-pay | Admitting: Family Medicine

## 2018-03-06 DIAGNOSIS — K219 Gastro-esophageal reflux disease without esophagitis: Secondary | ICD-10-CM | POA: Diagnosis not present

## 2018-03-07 DIAGNOSIS — K219 Gastro-esophageal reflux disease without esophagitis: Secondary | ICD-10-CM | POA: Diagnosis not present

## 2018-03-08 DIAGNOSIS — K219 Gastro-esophageal reflux disease without esophagitis: Secondary | ICD-10-CM | POA: Diagnosis not present

## 2018-03-09 DIAGNOSIS — K219 Gastro-esophageal reflux disease without esophagitis: Secondary | ICD-10-CM | POA: Diagnosis not present

## 2018-03-10 DIAGNOSIS — K219 Gastro-esophageal reflux disease without esophagitis: Secondary | ICD-10-CM | POA: Diagnosis not present

## 2018-03-11 DIAGNOSIS — K219 Gastro-esophageal reflux disease without esophagitis: Secondary | ICD-10-CM | POA: Diagnosis not present

## 2018-03-12 DIAGNOSIS — K219 Gastro-esophageal reflux disease without esophagitis: Secondary | ICD-10-CM | POA: Diagnosis not present

## 2018-03-13 DIAGNOSIS — K219 Gastro-esophageal reflux disease without esophagitis: Secondary | ICD-10-CM | POA: Diagnosis not present

## 2018-03-14 DIAGNOSIS — K219 Gastro-esophageal reflux disease without esophagitis: Secondary | ICD-10-CM | POA: Diagnosis not present

## 2018-03-15 DIAGNOSIS — K219 Gastro-esophageal reflux disease without esophagitis: Secondary | ICD-10-CM | POA: Diagnosis not present

## 2018-03-16 DIAGNOSIS — K219 Gastro-esophageal reflux disease without esophagitis: Secondary | ICD-10-CM | POA: Diagnosis not present

## 2018-03-17 DIAGNOSIS — K219 Gastro-esophageal reflux disease without esophagitis: Secondary | ICD-10-CM | POA: Diagnosis not present

## 2018-03-18 DIAGNOSIS — K219 Gastro-esophageal reflux disease without esophagitis: Secondary | ICD-10-CM | POA: Diagnosis not present

## 2018-03-19 DIAGNOSIS — K219 Gastro-esophageal reflux disease without esophagitis: Secondary | ICD-10-CM | POA: Diagnosis not present

## 2018-03-20 DIAGNOSIS — K219 Gastro-esophageal reflux disease without esophagitis: Secondary | ICD-10-CM | POA: Diagnosis not present

## 2018-03-21 DIAGNOSIS — K219 Gastro-esophageal reflux disease without esophagitis: Secondary | ICD-10-CM | POA: Diagnosis not present

## 2018-03-22 DIAGNOSIS — K219 Gastro-esophageal reflux disease without esophagitis: Secondary | ICD-10-CM | POA: Diagnosis not present

## 2018-03-23 DIAGNOSIS — K219 Gastro-esophageal reflux disease without esophagitis: Secondary | ICD-10-CM | POA: Diagnosis not present

## 2018-03-24 DIAGNOSIS — K219 Gastro-esophageal reflux disease without esophagitis: Secondary | ICD-10-CM | POA: Diagnosis not present

## 2018-03-25 DIAGNOSIS — K219 Gastro-esophageal reflux disease without esophagitis: Secondary | ICD-10-CM | POA: Diagnosis not present

## 2018-03-26 DIAGNOSIS — K219 Gastro-esophageal reflux disease without esophagitis: Secondary | ICD-10-CM | POA: Diagnosis not present

## 2018-03-27 DIAGNOSIS — K219 Gastro-esophageal reflux disease without esophagitis: Secondary | ICD-10-CM | POA: Diagnosis not present

## 2018-03-28 DIAGNOSIS — K219 Gastro-esophageal reflux disease without esophagitis: Secondary | ICD-10-CM | POA: Diagnosis not present

## 2018-03-29 DIAGNOSIS — K219 Gastro-esophageal reflux disease without esophagitis: Secondary | ICD-10-CM | POA: Diagnosis not present

## 2018-03-30 DIAGNOSIS — K219 Gastro-esophageal reflux disease without esophagitis: Secondary | ICD-10-CM | POA: Diagnosis not present

## 2018-03-31 DIAGNOSIS — K219 Gastro-esophageal reflux disease without esophagitis: Secondary | ICD-10-CM | POA: Diagnosis not present

## 2018-04-01 DIAGNOSIS — K219 Gastro-esophageal reflux disease without esophagitis: Secondary | ICD-10-CM | POA: Diagnosis not present

## 2018-04-02 DIAGNOSIS — K219 Gastro-esophageal reflux disease without esophagitis: Secondary | ICD-10-CM | POA: Diagnosis not present

## 2018-04-03 DIAGNOSIS — K219 Gastro-esophageal reflux disease without esophagitis: Secondary | ICD-10-CM | POA: Diagnosis not present

## 2018-04-04 DIAGNOSIS — K219 Gastro-esophageal reflux disease without esophagitis: Secondary | ICD-10-CM | POA: Diagnosis not present

## 2018-04-05 DIAGNOSIS — K219 Gastro-esophageal reflux disease without esophagitis: Secondary | ICD-10-CM | POA: Diagnosis not present

## 2018-04-06 DIAGNOSIS — K219 Gastro-esophageal reflux disease without esophagitis: Secondary | ICD-10-CM | POA: Diagnosis not present

## 2018-04-07 DIAGNOSIS — K219 Gastro-esophageal reflux disease without esophagitis: Secondary | ICD-10-CM | POA: Diagnosis not present

## 2018-04-08 DIAGNOSIS — K219 Gastro-esophageal reflux disease without esophagitis: Secondary | ICD-10-CM | POA: Diagnosis not present

## 2018-04-09 DIAGNOSIS — K219 Gastro-esophageal reflux disease without esophagitis: Secondary | ICD-10-CM | POA: Diagnosis not present

## 2018-04-10 DIAGNOSIS — K219 Gastro-esophageal reflux disease without esophagitis: Secondary | ICD-10-CM | POA: Diagnosis not present

## 2018-04-11 DIAGNOSIS — K219 Gastro-esophageal reflux disease without esophagitis: Secondary | ICD-10-CM | POA: Diagnosis not present

## 2018-04-12 DIAGNOSIS — K219 Gastro-esophageal reflux disease without esophagitis: Secondary | ICD-10-CM | POA: Diagnosis not present

## 2018-04-13 DIAGNOSIS — K219 Gastro-esophageal reflux disease without esophagitis: Secondary | ICD-10-CM | POA: Diagnosis not present

## 2018-04-14 DIAGNOSIS — K219 Gastro-esophageal reflux disease without esophagitis: Secondary | ICD-10-CM | POA: Diagnosis not present

## 2018-04-15 DIAGNOSIS — K219 Gastro-esophageal reflux disease without esophagitis: Secondary | ICD-10-CM | POA: Diagnosis not present

## 2018-04-16 DIAGNOSIS — K219 Gastro-esophageal reflux disease without esophagitis: Secondary | ICD-10-CM | POA: Diagnosis not present

## 2018-04-17 DIAGNOSIS — K219 Gastro-esophageal reflux disease without esophagitis: Secondary | ICD-10-CM | POA: Diagnosis not present

## 2018-04-18 DIAGNOSIS — K219 Gastro-esophageal reflux disease without esophagitis: Secondary | ICD-10-CM | POA: Diagnosis not present

## 2018-04-19 DIAGNOSIS — K219 Gastro-esophageal reflux disease without esophagitis: Secondary | ICD-10-CM | POA: Diagnosis not present

## 2018-04-20 DIAGNOSIS — K219 Gastro-esophageal reflux disease without esophagitis: Secondary | ICD-10-CM | POA: Diagnosis not present

## 2018-04-21 DIAGNOSIS — K219 Gastro-esophageal reflux disease without esophagitis: Secondary | ICD-10-CM | POA: Diagnosis not present

## 2018-04-22 DIAGNOSIS — K219 Gastro-esophageal reflux disease without esophagitis: Secondary | ICD-10-CM | POA: Diagnosis not present

## 2018-04-23 DIAGNOSIS — K219 Gastro-esophageal reflux disease without esophagitis: Secondary | ICD-10-CM | POA: Diagnosis not present

## 2018-04-24 DIAGNOSIS — K219 Gastro-esophageal reflux disease without esophagitis: Secondary | ICD-10-CM | POA: Diagnosis not present

## 2018-04-25 DIAGNOSIS — K219 Gastro-esophageal reflux disease without esophagitis: Secondary | ICD-10-CM | POA: Diagnosis not present

## 2018-04-26 DIAGNOSIS — K219 Gastro-esophageal reflux disease without esophagitis: Secondary | ICD-10-CM | POA: Diagnosis not present

## 2018-04-27 DIAGNOSIS — K219 Gastro-esophageal reflux disease without esophagitis: Secondary | ICD-10-CM | POA: Diagnosis not present

## 2018-04-28 DIAGNOSIS — K219 Gastro-esophageal reflux disease without esophagitis: Secondary | ICD-10-CM | POA: Diagnosis not present

## 2018-04-29 DIAGNOSIS — K219 Gastro-esophageal reflux disease without esophagitis: Secondary | ICD-10-CM | POA: Diagnosis not present

## 2018-04-30 DIAGNOSIS — K219 Gastro-esophageal reflux disease without esophagitis: Secondary | ICD-10-CM | POA: Diagnosis not present

## 2018-05-01 DIAGNOSIS — K219 Gastro-esophageal reflux disease without esophagitis: Secondary | ICD-10-CM | POA: Diagnosis not present

## 2018-05-02 DIAGNOSIS — K219 Gastro-esophageal reflux disease without esophagitis: Secondary | ICD-10-CM | POA: Diagnosis not present

## 2018-05-03 DIAGNOSIS — K219 Gastro-esophageal reflux disease without esophagitis: Secondary | ICD-10-CM | POA: Diagnosis not present

## 2018-05-04 DIAGNOSIS — K219 Gastro-esophageal reflux disease without esophagitis: Secondary | ICD-10-CM | POA: Diagnosis not present

## 2018-05-05 DIAGNOSIS — K219 Gastro-esophageal reflux disease without esophagitis: Secondary | ICD-10-CM | POA: Diagnosis not present

## 2018-05-06 DIAGNOSIS — K219 Gastro-esophageal reflux disease without esophagitis: Secondary | ICD-10-CM | POA: Diagnosis not present

## 2018-05-07 DIAGNOSIS — K219 Gastro-esophageal reflux disease without esophagitis: Secondary | ICD-10-CM | POA: Diagnosis not present

## 2018-05-08 DIAGNOSIS — K219 Gastro-esophageal reflux disease without esophagitis: Secondary | ICD-10-CM | POA: Diagnosis not present

## 2018-05-09 DIAGNOSIS — K219 Gastro-esophageal reflux disease without esophagitis: Secondary | ICD-10-CM | POA: Diagnosis not present

## 2018-05-10 DIAGNOSIS — K219 Gastro-esophageal reflux disease without esophagitis: Secondary | ICD-10-CM | POA: Diagnosis not present

## 2018-05-11 DIAGNOSIS — K219 Gastro-esophageal reflux disease without esophagitis: Secondary | ICD-10-CM | POA: Diagnosis not present

## 2018-05-12 DIAGNOSIS — K219 Gastro-esophageal reflux disease without esophagitis: Secondary | ICD-10-CM | POA: Diagnosis not present

## 2018-05-13 DIAGNOSIS — K219 Gastro-esophageal reflux disease without esophagitis: Secondary | ICD-10-CM | POA: Diagnosis not present

## 2018-05-14 DIAGNOSIS — K219 Gastro-esophageal reflux disease without esophagitis: Secondary | ICD-10-CM | POA: Diagnosis not present

## 2018-05-15 DIAGNOSIS — K219 Gastro-esophageal reflux disease without esophagitis: Secondary | ICD-10-CM | POA: Diagnosis not present

## 2018-05-16 DIAGNOSIS — K219 Gastro-esophageal reflux disease without esophagitis: Secondary | ICD-10-CM | POA: Diagnosis not present

## 2018-05-17 DIAGNOSIS — K219 Gastro-esophageal reflux disease without esophagitis: Secondary | ICD-10-CM | POA: Diagnosis not present

## 2018-05-18 DIAGNOSIS — K219 Gastro-esophageal reflux disease without esophagitis: Secondary | ICD-10-CM | POA: Diagnosis not present

## 2018-05-19 DIAGNOSIS — K219 Gastro-esophageal reflux disease without esophagitis: Secondary | ICD-10-CM | POA: Diagnosis not present

## 2018-05-20 DIAGNOSIS — K219 Gastro-esophageal reflux disease without esophagitis: Secondary | ICD-10-CM | POA: Diagnosis not present

## 2018-05-21 DIAGNOSIS — K219 Gastro-esophageal reflux disease without esophagitis: Secondary | ICD-10-CM | POA: Diagnosis not present

## 2018-05-22 DIAGNOSIS — K219 Gastro-esophageal reflux disease without esophagitis: Secondary | ICD-10-CM | POA: Diagnosis not present

## 2018-05-23 DIAGNOSIS — K219 Gastro-esophageal reflux disease without esophagitis: Secondary | ICD-10-CM | POA: Diagnosis not present

## 2018-05-29 ENCOUNTER — Other Ambulatory Visit: Payer: Self-pay | Admitting: Family Medicine

## 2018-05-31 ENCOUNTER — Other Ambulatory Visit: Payer: Self-pay | Admitting: Family Medicine

## 2018-06-07 DIAGNOSIS — K219 Gastro-esophageal reflux disease without esophagitis: Secondary | ICD-10-CM | POA: Diagnosis not present

## 2018-06-08 DIAGNOSIS — K219 Gastro-esophageal reflux disease without esophagitis: Secondary | ICD-10-CM | POA: Diagnosis not present

## 2018-06-09 DIAGNOSIS — K219 Gastro-esophageal reflux disease without esophagitis: Secondary | ICD-10-CM | POA: Diagnosis not present

## 2018-06-10 DIAGNOSIS — K219 Gastro-esophageal reflux disease without esophagitis: Secondary | ICD-10-CM | POA: Diagnosis not present

## 2018-06-11 DIAGNOSIS — K219 Gastro-esophageal reflux disease without esophagitis: Secondary | ICD-10-CM | POA: Diagnosis not present

## 2018-06-12 DIAGNOSIS — K219 Gastro-esophageal reflux disease without esophagitis: Secondary | ICD-10-CM | POA: Diagnosis not present

## 2018-06-13 DIAGNOSIS — K219 Gastro-esophageal reflux disease without esophagitis: Secondary | ICD-10-CM | POA: Diagnosis not present

## 2018-06-14 DIAGNOSIS — K219 Gastro-esophageal reflux disease without esophagitis: Secondary | ICD-10-CM | POA: Diagnosis not present

## 2018-06-15 DIAGNOSIS — K219 Gastro-esophageal reflux disease without esophagitis: Secondary | ICD-10-CM | POA: Diagnosis not present

## 2018-06-16 DIAGNOSIS — K219 Gastro-esophageal reflux disease without esophagitis: Secondary | ICD-10-CM | POA: Diagnosis not present

## 2018-06-17 DIAGNOSIS — K219 Gastro-esophageal reflux disease without esophagitis: Secondary | ICD-10-CM | POA: Diagnosis not present

## 2018-06-18 DIAGNOSIS — K219 Gastro-esophageal reflux disease without esophagitis: Secondary | ICD-10-CM | POA: Diagnosis not present

## 2018-06-19 DIAGNOSIS — K219 Gastro-esophageal reflux disease without esophagitis: Secondary | ICD-10-CM | POA: Diagnosis not present

## 2018-06-20 DIAGNOSIS — K219 Gastro-esophageal reflux disease without esophagitis: Secondary | ICD-10-CM | POA: Diagnosis not present

## 2018-06-21 DIAGNOSIS — K219 Gastro-esophageal reflux disease without esophagitis: Secondary | ICD-10-CM | POA: Diagnosis not present

## 2018-06-22 DIAGNOSIS — K219 Gastro-esophageal reflux disease without esophagitis: Secondary | ICD-10-CM | POA: Diagnosis not present

## 2018-06-23 DIAGNOSIS — K219 Gastro-esophageal reflux disease without esophagitis: Secondary | ICD-10-CM | POA: Diagnosis not present

## 2018-06-24 DIAGNOSIS — K219 Gastro-esophageal reflux disease without esophagitis: Secondary | ICD-10-CM | POA: Diagnosis not present

## 2018-06-25 DIAGNOSIS — K219 Gastro-esophageal reflux disease without esophagitis: Secondary | ICD-10-CM | POA: Diagnosis not present

## 2018-06-26 DIAGNOSIS — K219 Gastro-esophageal reflux disease without esophagitis: Secondary | ICD-10-CM | POA: Diagnosis not present

## 2018-06-27 DIAGNOSIS — K219 Gastro-esophageal reflux disease without esophagitis: Secondary | ICD-10-CM | POA: Diagnosis not present

## 2018-06-28 DIAGNOSIS — K219 Gastro-esophageal reflux disease without esophagitis: Secondary | ICD-10-CM | POA: Diagnosis not present

## 2018-06-29 DIAGNOSIS — K219 Gastro-esophageal reflux disease without esophagitis: Secondary | ICD-10-CM | POA: Diagnosis not present

## 2018-06-30 DIAGNOSIS — K219 Gastro-esophageal reflux disease without esophagitis: Secondary | ICD-10-CM | POA: Diagnosis not present

## 2018-07-01 DIAGNOSIS — K219 Gastro-esophageal reflux disease without esophagitis: Secondary | ICD-10-CM | POA: Diagnosis not present

## 2018-07-02 DIAGNOSIS — K219 Gastro-esophageal reflux disease without esophagitis: Secondary | ICD-10-CM | POA: Diagnosis not present

## 2018-07-03 DIAGNOSIS — K219 Gastro-esophageal reflux disease without esophagitis: Secondary | ICD-10-CM | POA: Diagnosis not present

## 2018-07-04 DIAGNOSIS — K219 Gastro-esophageal reflux disease without esophagitis: Secondary | ICD-10-CM | POA: Diagnosis not present

## 2018-07-05 DIAGNOSIS — K219 Gastro-esophageal reflux disease without esophagitis: Secondary | ICD-10-CM | POA: Diagnosis not present

## 2018-07-06 DIAGNOSIS — K219 Gastro-esophageal reflux disease without esophagitis: Secondary | ICD-10-CM | POA: Diagnosis not present

## 2018-07-07 DIAGNOSIS — K219 Gastro-esophageal reflux disease without esophagitis: Secondary | ICD-10-CM | POA: Diagnosis not present

## 2018-07-08 DIAGNOSIS — K219 Gastro-esophageal reflux disease without esophagitis: Secondary | ICD-10-CM | POA: Diagnosis not present

## 2018-07-09 DIAGNOSIS — K219 Gastro-esophageal reflux disease without esophagitis: Secondary | ICD-10-CM | POA: Diagnosis not present

## 2018-07-10 DIAGNOSIS — K219 Gastro-esophageal reflux disease without esophagitis: Secondary | ICD-10-CM | POA: Diagnosis not present

## 2018-07-11 DIAGNOSIS — K219 Gastro-esophageal reflux disease without esophagitis: Secondary | ICD-10-CM | POA: Diagnosis not present

## 2018-07-12 DIAGNOSIS — K219 Gastro-esophageal reflux disease without esophagitis: Secondary | ICD-10-CM | POA: Diagnosis not present

## 2018-07-13 DIAGNOSIS — K219 Gastro-esophageal reflux disease without esophagitis: Secondary | ICD-10-CM | POA: Diagnosis not present

## 2018-07-14 DIAGNOSIS — K219 Gastro-esophageal reflux disease without esophagitis: Secondary | ICD-10-CM | POA: Diagnosis not present

## 2018-07-15 DIAGNOSIS — K219 Gastro-esophageal reflux disease without esophagitis: Secondary | ICD-10-CM | POA: Diagnosis not present

## 2018-07-16 DIAGNOSIS — K219 Gastro-esophageal reflux disease without esophagitis: Secondary | ICD-10-CM | POA: Diagnosis not present

## 2018-07-17 DIAGNOSIS — K219 Gastro-esophageal reflux disease without esophagitis: Secondary | ICD-10-CM | POA: Diagnosis not present

## 2018-07-18 DIAGNOSIS — K219 Gastro-esophageal reflux disease without esophagitis: Secondary | ICD-10-CM | POA: Diagnosis not present

## 2018-07-19 DIAGNOSIS — K219 Gastro-esophageal reflux disease without esophagitis: Secondary | ICD-10-CM | POA: Diagnosis not present

## 2018-07-20 DIAGNOSIS — K219 Gastro-esophageal reflux disease without esophagitis: Secondary | ICD-10-CM | POA: Diagnosis not present

## 2018-07-21 DIAGNOSIS — K219 Gastro-esophageal reflux disease without esophagitis: Secondary | ICD-10-CM | POA: Diagnosis not present

## 2018-07-22 DIAGNOSIS — K219 Gastro-esophageal reflux disease without esophagitis: Secondary | ICD-10-CM | POA: Diagnosis not present

## 2018-07-23 DIAGNOSIS — K219 Gastro-esophageal reflux disease without esophagitis: Secondary | ICD-10-CM | POA: Diagnosis not present

## 2018-07-24 DIAGNOSIS — K219 Gastro-esophageal reflux disease without esophagitis: Secondary | ICD-10-CM | POA: Diagnosis not present

## 2018-07-25 DIAGNOSIS — K219 Gastro-esophageal reflux disease without esophagitis: Secondary | ICD-10-CM | POA: Diagnosis not present

## 2018-07-26 DIAGNOSIS — K219 Gastro-esophageal reflux disease without esophagitis: Secondary | ICD-10-CM | POA: Diagnosis not present

## 2018-07-27 DIAGNOSIS — K219 Gastro-esophageal reflux disease without esophagitis: Secondary | ICD-10-CM | POA: Diagnosis not present

## 2018-07-28 DIAGNOSIS — K219 Gastro-esophageal reflux disease without esophagitis: Secondary | ICD-10-CM | POA: Diagnosis not present

## 2018-07-29 DIAGNOSIS — K219 Gastro-esophageal reflux disease without esophagitis: Secondary | ICD-10-CM | POA: Diagnosis not present

## 2018-07-30 DIAGNOSIS — K219 Gastro-esophageal reflux disease without esophagitis: Secondary | ICD-10-CM | POA: Diagnosis not present

## 2018-07-31 DIAGNOSIS — K219 Gastro-esophageal reflux disease without esophagitis: Secondary | ICD-10-CM | POA: Diagnosis not present

## 2018-08-01 DIAGNOSIS — K219 Gastro-esophageal reflux disease without esophagitis: Secondary | ICD-10-CM | POA: Diagnosis not present

## 2018-08-02 DIAGNOSIS — K219 Gastro-esophageal reflux disease without esophagitis: Secondary | ICD-10-CM | POA: Diagnosis not present

## 2018-08-03 DIAGNOSIS — K219 Gastro-esophageal reflux disease without esophagitis: Secondary | ICD-10-CM | POA: Diagnosis not present

## 2018-08-04 DIAGNOSIS — K219 Gastro-esophageal reflux disease without esophagitis: Secondary | ICD-10-CM | POA: Diagnosis not present

## 2018-08-05 DIAGNOSIS — K219 Gastro-esophageal reflux disease without esophagitis: Secondary | ICD-10-CM | POA: Diagnosis not present

## 2018-08-06 DIAGNOSIS — K219 Gastro-esophageal reflux disease without esophagitis: Secondary | ICD-10-CM | POA: Diagnosis not present

## 2018-08-07 DIAGNOSIS — K219 Gastro-esophageal reflux disease without esophagitis: Secondary | ICD-10-CM | POA: Diagnosis not present

## 2018-08-08 DIAGNOSIS — K219 Gastro-esophageal reflux disease without esophagitis: Secondary | ICD-10-CM | POA: Diagnosis not present

## 2018-08-09 DIAGNOSIS — K219 Gastro-esophageal reflux disease without esophagitis: Secondary | ICD-10-CM | POA: Diagnosis not present

## 2018-08-10 DIAGNOSIS — K219 Gastro-esophageal reflux disease without esophagitis: Secondary | ICD-10-CM | POA: Diagnosis not present

## 2018-08-11 DIAGNOSIS — K219 Gastro-esophageal reflux disease without esophagitis: Secondary | ICD-10-CM | POA: Diagnosis not present

## 2018-08-12 DIAGNOSIS — K219 Gastro-esophageal reflux disease without esophagitis: Secondary | ICD-10-CM | POA: Diagnosis not present

## 2018-08-13 DIAGNOSIS — K219 Gastro-esophageal reflux disease without esophagitis: Secondary | ICD-10-CM | POA: Diagnosis not present

## 2018-08-14 DIAGNOSIS — K219 Gastro-esophageal reflux disease without esophagitis: Secondary | ICD-10-CM | POA: Diagnosis not present

## 2018-08-15 DIAGNOSIS — K219 Gastro-esophageal reflux disease without esophagitis: Secondary | ICD-10-CM | POA: Diagnosis not present

## 2018-08-16 DIAGNOSIS — K219 Gastro-esophageal reflux disease without esophagitis: Secondary | ICD-10-CM | POA: Diagnosis not present

## 2018-08-17 DIAGNOSIS — K219 Gastro-esophageal reflux disease without esophagitis: Secondary | ICD-10-CM | POA: Diagnosis not present

## 2018-08-18 DIAGNOSIS — K219 Gastro-esophageal reflux disease without esophagitis: Secondary | ICD-10-CM | POA: Diagnosis not present

## 2018-08-19 DIAGNOSIS — K219 Gastro-esophageal reflux disease without esophagitis: Secondary | ICD-10-CM | POA: Diagnosis not present

## 2018-08-20 DIAGNOSIS — K219 Gastro-esophageal reflux disease without esophagitis: Secondary | ICD-10-CM | POA: Diagnosis not present

## 2018-08-21 DIAGNOSIS — K219 Gastro-esophageal reflux disease without esophagitis: Secondary | ICD-10-CM | POA: Diagnosis not present

## 2018-08-22 DIAGNOSIS — K219 Gastro-esophageal reflux disease without esophagitis: Secondary | ICD-10-CM | POA: Diagnosis not present

## 2018-09-03 DIAGNOSIS — K219 Gastro-esophageal reflux disease without esophagitis: Secondary | ICD-10-CM | POA: Diagnosis not present

## 2018-09-04 DIAGNOSIS — K219 Gastro-esophageal reflux disease without esophagitis: Secondary | ICD-10-CM | POA: Diagnosis not present

## 2018-09-05 DIAGNOSIS — K219 Gastro-esophageal reflux disease without esophagitis: Secondary | ICD-10-CM | POA: Diagnosis not present

## 2018-09-06 DIAGNOSIS — K219 Gastro-esophageal reflux disease without esophagitis: Secondary | ICD-10-CM | POA: Diagnosis not present

## 2018-09-07 DIAGNOSIS — K219 Gastro-esophageal reflux disease without esophagitis: Secondary | ICD-10-CM | POA: Diagnosis not present

## 2018-09-08 DIAGNOSIS — K219 Gastro-esophageal reflux disease without esophagitis: Secondary | ICD-10-CM | POA: Diagnosis not present

## 2018-09-09 DIAGNOSIS — K219 Gastro-esophageal reflux disease without esophagitis: Secondary | ICD-10-CM | POA: Diagnosis not present

## 2018-09-10 DIAGNOSIS — K219 Gastro-esophageal reflux disease without esophagitis: Secondary | ICD-10-CM | POA: Diagnosis not present

## 2018-09-11 DIAGNOSIS — K219 Gastro-esophageal reflux disease without esophagitis: Secondary | ICD-10-CM | POA: Diagnosis not present

## 2018-09-12 DIAGNOSIS — K219 Gastro-esophageal reflux disease without esophagitis: Secondary | ICD-10-CM | POA: Diagnosis not present

## 2018-09-13 DIAGNOSIS — K219 Gastro-esophageal reflux disease without esophagitis: Secondary | ICD-10-CM | POA: Diagnosis not present

## 2018-09-14 DIAGNOSIS — K219 Gastro-esophageal reflux disease without esophagitis: Secondary | ICD-10-CM | POA: Diagnosis not present

## 2018-09-15 DIAGNOSIS — K219 Gastro-esophageal reflux disease without esophagitis: Secondary | ICD-10-CM | POA: Diagnosis not present

## 2018-09-16 DIAGNOSIS — K219 Gastro-esophageal reflux disease without esophagitis: Secondary | ICD-10-CM | POA: Diagnosis not present

## 2018-09-17 DIAGNOSIS — K219 Gastro-esophageal reflux disease without esophagitis: Secondary | ICD-10-CM | POA: Diagnosis not present

## 2018-09-18 DIAGNOSIS — K219 Gastro-esophageal reflux disease without esophagitis: Secondary | ICD-10-CM | POA: Diagnosis not present

## 2018-09-19 DIAGNOSIS — K219 Gastro-esophageal reflux disease without esophagitis: Secondary | ICD-10-CM | POA: Diagnosis not present

## 2018-09-20 DIAGNOSIS — K219 Gastro-esophageal reflux disease without esophagitis: Secondary | ICD-10-CM | POA: Diagnosis not present

## 2018-09-21 DIAGNOSIS — K219 Gastro-esophageal reflux disease without esophagitis: Secondary | ICD-10-CM | POA: Diagnosis not present

## 2018-09-22 DIAGNOSIS — K219 Gastro-esophageal reflux disease without esophagitis: Secondary | ICD-10-CM | POA: Diagnosis not present

## 2018-09-23 DIAGNOSIS — K219 Gastro-esophageal reflux disease without esophagitis: Secondary | ICD-10-CM | POA: Diagnosis not present

## 2018-09-24 DIAGNOSIS — K219 Gastro-esophageal reflux disease without esophagitis: Secondary | ICD-10-CM | POA: Diagnosis not present

## 2018-09-25 DIAGNOSIS — K219 Gastro-esophageal reflux disease without esophagitis: Secondary | ICD-10-CM | POA: Diagnosis not present

## 2018-09-26 DIAGNOSIS — K219 Gastro-esophageal reflux disease without esophagitis: Secondary | ICD-10-CM | POA: Diagnosis not present

## 2018-09-27 DIAGNOSIS — K219 Gastro-esophageal reflux disease without esophagitis: Secondary | ICD-10-CM | POA: Diagnosis not present

## 2018-09-28 DIAGNOSIS — K219 Gastro-esophageal reflux disease without esophagitis: Secondary | ICD-10-CM | POA: Diagnosis not present

## 2018-09-29 DIAGNOSIS — K219 Gastro-esophageal reflux disease without esophagitis: Secondary | ICD-10-CM | POA: Diagnosis not present

## 2018-09-30 DIAGNOSIS — K219 Gastro-esophageal reflux disease without esophagitis: Secondary | ICD-10-CM | POA: Diagnosis not present

## 2018-10-01 DIAGNOSIS — K219 Gastro-esophageal reflux disease without esophagitis: Secondary | ICD-10-CM | POA: Diagnosis not present

## 2018-10-02 DIAGNOSIS — K219 Gastro-esophageal reflux disease without esophagitis: Secondary | ICD-10-CM | POA: Diagnosis not present

## 2018-10-03 DIAGNOSIS — K219 Gastro-esophageal reflux disease without esophagitis: Secondary | ICD-10-CM | POA: Diagnosis not present

## 2018-10-04 DIAGNOSIS — K219 Gastro-esophageal reflux disease without esophagitis: Secondary | ICD-10-CM | POA: Diagnosis not present

## 2018-10-05 DIAGNOSIS — K219 Gastro-esophageal reflux disease without esophagitis: Secondary | ICD-10-CM | POA: Diagnosis not present

## 2018-10-06 DIAGNOSIS — K219 Gastro-esophageal reflux disease without esophagitis: Secondary | ICD-10-CM | POA: Diagnosis not present

## 2018-10-07 DIAGNOSIS — K219 Gastro-esophageal reflux disease without esophagitis: Secondary | ICD-10-CM | POA: Diagnosis not present

## 2018-10-08 DIAGNOSIS — K219 Gastro-esophageal reflux disease without esophagitis: Secondary | ICD-10-CM | POA: Diagnosis not present

## 2018-10-09 DIAGNOSIS — K219 Gastro-esophageal reflux disease without esophagitis: Secondary | ICD-10-CM | POA: Diagnosis not present

## 2018-10-10 DIAGNOSIS — K219 Gastro-esophageal reflux disease without esophagitis: Secondary | ICD-10-CM | POA: Diagnosis not present

## 2018-10-18 DIAGNOSIS — K219 Gastro-esophageal reflux disease without esophagitis: Secondary | ICD-10-CM | POA: Diagnosis not present

## 2018-10-19 DIAGNOSIS — K219 Gastro-esophageal reflux disease without esophagitis: Secondary | ICD-10-CM | POA: Diagnosis not present

## 2018-10-20 DIAGNOSIS — K219 Gastro-esophageal reflux disease without esophagitis: Secondary | ICD-10-CM | POA: Diagnosis not present

## 2018-10-21 DIAGNOSIS — K219 Gastro-esophageal reflux disease without esophagitis: Secondary | ICD-10-CM | POA: Diagnosis not present

## 2018-10-22 DIAGNOSIS — K219 Gastro-esophageal reflux disease without esophagitis: Secondary | ICD-10-CM | POA: Diagnosis not present

## 2018-10-23 DIAGNOSIS — K219 Gastro-esophageal reflux disease without esophagitis: Secondary | ICD-10-CM | POA: Diagnosis not present

## 2018-10-24 DIAGNOSIS — K219 Gastro-esophageal reflux disease without esophagitis: Secondary | ICD-10-CM | POA: Diagnosis not present

## 2018-10-25 DIAGNOSIS — K219 Gastro-esophageal reflux disease without esophagitis: Secondary | ICD-10-CM | POA: Diagnosis not present

## 2018-10-26 DIAGNOSIS — K219 Gastro-esophageal reflux disease without esophagitis: Secondary | ICD-10-CM | POA: Diagnosis not present

## 2018-10-27 DIAGNOSIS — K219 Gastro-esophageal reflux disease without esophagitis: Secondary | ICD-10-CM | POA: Diagnosis not present

## 2018-10-28 DIAGNOSIS — K219 Gastro-esophageal reflux disease without esophagitis: Secondary | ICD-10-CM | POA: Diagnosis not present

## 2018-10-29 DIAGNOSIS — K219 Gastro-esophageal reflux disease without esophagitis: Secondary | ICD-10-CM | POA: Diagnosis not present

## 2018-10-30 DIAGNOSIS — K219 Gastro-esophageal reflux disease without esophagitis: Secondary | ICD-10-CM | POA: Diagnosis not present

## 2018-10-31 DIAGNOSIS — K219 Gastro-esophageal reflux disease without esophagitis: Secondary | ICD-10-CM | POA: Diagnosis not present

## 2018-11-01 DIAGNOSIS — K219 Gastro-esophageal reflux disease without esophagitis: Secondary | ICD-10-CM | POA: Diagnosis not present

## 2018-11-02 DIAGNOSIS — K219 Gastro-esophageal reflux disease without esophagitis: Secondary | ICD-10-CM | POA: Diagnosis not present

## 2018-11-03 DIAGNOSIS — K219 Gastro-esophageal reflux disease without esophagitis: Secondary | ICD-10-CM | POA: Diagnosis not present

## 2018-11-04 DIAGNOSIS — K219 Gastro-esophageal reflux disease without esophagitis: Secondary | ICD-10-CM | POA: Diagnosis not present

## 2018-11-05 DIAGNOSIS — K219 Gastro-esophageal reflux disease without esophagitis: Secondary | ICD-10-CM | POA: Diagnosis not present

## 2018-11-06 DIAGNOSIS — K219 Gastro-esophageal reflux disease without esophagitis: Secondary | ICD-10-CM | POA: Diagnosis not present

## 2018-11-07 DIAGNOSIS — K219 Gastro-esophageal reflux disease without esophagitis: Secondary | ICD-10-CM | POA: Diagnosis not present

## 2018-11-08 DIAGNOSIS — K219 Gastro-esophageal reflux disease without esophagitis: Secondary | ICD-10-CM | POA: Diagnosis not present

## 2018-11-09 DIAGNOSIS — K219 Gastro-esophageal reflux disease without esophagitis: Secondary | ICD-10-CM | POA: Diagnosis not present

## 2018-11-10 DIAGNOSIS — K219 Gastro-esophageal reflux disease without esophagitis: Secondary | ICD-10-CM | POA: Diagnosis not present

## 2018-11-11 DIAGNOSIS — K219 Gastro-esophageal reflux disease without esophagitis: Secondary | ICD-10-CM | POA: Diagnosis not present

## 2018-11-12 DIAGNOSIS — K219 Gastro-esophageal reflux disease without esophagitis: Secondary | ICD-10-CM | POA: Diagnosis not present

## 2018-11-13 DIAGNOSIS — K219 Gastro-esophageal reflux disease without esophagitis: Secondary | ICD-10-CM | POA: Diagnosis not present

## 2018-11-14 DIAGNOSIS — K219 Gastro-esophageal reflux disease without esophagitis: Secondary | ICD-10-CM | POA: Diagnosis not present

## 2018-11-15 DIAGNOSIS — K219 Gastro-esophageal reflux disease without esophagitis: Secondary | ICD-10-CM | POA: Diagnosis not present

## 2018-11-16 DIAGNOSIS — K219 Gastro-esophageal reflux disease without esophagitis: Secondary | ICD-10-CM | POA: Diagnosis not present

## 2018-11-17 DIAGNOSIS — K219 Gastro-esophageal reflux disease without esophagitis: Secondary | ICD-10-CM | POA: Diagnosis not present

## 2018-11-18 DIAGNOSIS — K219 Gastro-esophageal reflux disease without esophagitis: Secondary | ICD-10-CM | POA: Diagnosis not present

## 2018-11-19 DIAGNOSIS — K219 Gastro-esophageal reflux disease without esophagitis: Secondary | ICD-10-CM | POA: Diagnosis not present

## 2018-11-20 DIAGNOSIS — K219 Gastro-esophageal reflux disease without esophagitis: Secondary | ICD-10-CM | POA: Diagnosis not present

## 2018-11-21 DIAGNOSIS — K219 Gastro-esophageal reflux disease without esophagitis: Secondary | ICD-10-CM | POA: Diagnosis not present

## 2018-11-22 DIAGNOSIS — K219 Gastro-esophageal reflux disease without esophagitis: Secondary | ICD-10-CM | POA: Diagnosis not present

## 2018-11-23 DIAGNOSIS — K219 Gastro-esophageal reflux disease without esophagitis: Secondary | ICD-10-CM | POA: Diagnosis not present

## 2018-11-24 DIAGNOSIS — K219 Gastro-esophageal reflux disease without esophagitis: Secondary | ICD-10-CM | POA: Diagnosis not present

## 2018-11-25 DIAGNOSIS — K219 Gastro-esophageal reflux disease without esophagitis: Secondary | ICD-10-CM | POA: Diagnosis not present

## 2018-11-26 DIAGNOSIS — K219 Gastro-esophageal reflux disease without esophagitis: Secondary | ICD-10-CM | POA: Diagnosis not present

## 2018-11-27 DIAGNOSIS — K219 Gastro-esophageal reflux disease without esophagitis: Secondary | ICD-10-CM | POA: Diagnosis not present

## 2018-11-28 DIAGNOSIS — K219 Gastro-esophageal reflux disease without esophagitis: Secondary | ICD-10-CM | POA: Diagnosis not present

## 2018-11-29 DIAGNOSIS — K219 Gastro-esophageal reflux disease without esophagitis: Secondary | ICD-10-CM | POA: Diagnosis not present

## 2018-11-30 DIAGNOSIS — K219 Gastro-esophageal reflux disease without esophagitis: Secondary | ICD-10-CM | POA: Diagnosis not present

## 2018-12-01 DIAGNOSIS — K219 Gastro-esophageal reflux disease without esophagitis: Secondary | ICD-10-CM | POA: Diagnosis not present

## 2018-12-02 DIAGNOSIS — F1420 Cocaine dependence, uncomplicated: Secondary | ICD-10-CM | POA: Diagnosis not present

## 2018-12-02 DIAGNOSIS — F1020 Alcohol dependence, uncomplicated: Secondary | ICD-10-CM | POA: Diagnosis not present

## 2018-12-02 DIAGNOSIS — F17200 Nicotine dependence, unspecified, uncomplicated: Secondary | ICD-10-CM | POA: Diagnosis not present

## 2018-12-02 DIAGNOSIS — K219 Gastro-esophageal reflux disease without esophagitis: Secondary | ICD-10-CM | POA: Diagnosis not present

## 2018-12-02 DIAGNOSIS — F10239 Alcohol dependence with withdrawal, unspecified: Secondary | ICD-10-CM | POA: Diagnosis not present

## 2018-12-06 DIAGNOSIS — F1420 Cocaine dependence, uncomplicated: Secondary | ICD-10-CM | POA: Diagnosis not present

## 2018-12-06 DIAGNOSIS — F10239 Alcohol dependence with withdrawal, unspecified: Secondary | ICD-10-CM | POA: Diagnosis not present

## 2018-12-06 DIAGNOSIS — F1020 Alcohol dependence, uncomplicated: Secondary | ICD-10-CM | POA: Diagnosis not present

## 2018-12-07 DIAGNOSIS — F1420 Cocaine dependence, uncomplicated: Secondary | ICD-10-CM | POA: Diagnosis not present

## 2018-12-07 DIAGNOSIS — F10239 Alcohol dependence with withdrawal, unspecified: Secondary | ICD-10-CM | POA: Diagnosis not present

## 2018-12-07 DIAGNOSIS — F1020 Alcohol dependence, uncomplicated: Secondary | ICD-10-CM | POA: Diagnosis not present

## 2018-12-08 DIAGNOSIS — F1420 Cocaine dependence, uncomplicated: Secondary | ICD-10-CM | POA: Diagnosis not present

## 2018-12-08 DIAGNOSIS — F1020 Alcohol dependence, uncomplicated: Secondary | ICD-10-CM | POA: Diagnosis not present

## 2018-12-08 DIAGNOSIS — F10239 Alcohol dependence with withdrawal, unspecified: Secondary | ICD-10-CM | POA: Diagnosis not present

## 2018-12-09 DIAGNOSIS — F1420 Cocaine dependence, uncomplicated: Secondary | ICD-10-CM | POA: Diagnosis not present

## 2018-12-09 DIAGNOSIS — F1020 Alcohol dependence, uncomplicated: Secondary | ICD-10-CM | POA: Diagnosis not present

## 2018-12-09 DIAGNOSIS — F10239 Alcohol dependence with withdrawal, unspecified: Secondary | ICD-10-CM | POA: Diagnosis not present

## 2018-12-10 DIAGNOSIS — F1020 Alcohol dependence, uncomplicated: Secondary | ICD-10-CM | POA: Diagnosis not present

## 2018-12-10 DIAGNOSIS — F10239 Alcohol dependence with withdrawal, unspecified: Secondary | ICD-10-CM | POA: Diagnosis not present

## 2018-12-10 DIAGNOSIS — F1420 Cocaine dependence, uncomplicated: Secondary | ICD-10-CM | POA: Diagnosis not present

## 2018-12-11 DIAGNOSIS — F1420 Cocaine dependence, uncomplicated: Secondary | ICD-10-CM | POA: Diagnosis not present

## 2018-12-11 DIAGNOSIS — F1020 Alcohol dependence, uncomplicated: Secondary | ICD-10-CM | POA: Diagnosis not present

## 2018-12-11 DIAGNOSIS — F10239 Alcohol dependence with withdrawal, unspecified: Secondary | ICD-10-CM | POA: Diagnosis not present

## 2018-12-12 DIAGNOSIS — F1020 Alcohol dependence, uncomplicated: Secondary | ICD-10-CM | POA: Diagnosis not present

## 2018-12-12 DIAGNOSIS — F10239 Alcohol dependence with withdrawal, unspecified: Secondary | ICD-10-CM | POA: Diagnosis not present

## 2018-12-12 DIAGNOSIS — F1420 Cocaine dependence, uncomplicated: Secondary | ICD-10-CM | POA: Diagnosis not present

## 2018-12-13 DIAGNOSIS — F1020 Alcohol dependence, uncomplicated: Secondary | ICD-10-CM | POA: Diagnosis not present

## 2018-12-13 DIAGNOSIS — F10239 Alcohol dependence with withdrawal, unspecified: Secondary | ICD-10-CM | POA: Diagnosis not present

## 2018-12-13 DIAGNOSIS — F1420 Cocaine dependence, uncomplicated: Secondary | ICD-10-CM | POA: Diagnosis not present

## 2018-12-14 DIAGNOSIS — F1420 Cocaine dependence, uncomplicated: Secondary | ICD-10-CM | POA: Diagnosis not present

## 2018-12-14 DIAGNOSIS — F1020 Alcohol dependence, uncomplicated: Secondary | ICD-10-CM | POA: Diagnosis not present

## 2018-12-14 DIAGNOSIS — F10239 Alcohol dependence with withdrawal, unspecified: Secondary | ICD-10-CM | POA: Diagnosis not present

## 2018-12-15 DIAGNOSIS — F1020 Alcohol dependence, uncomplicated: Secondary | ICD-10-CM | POA: Diagnosis not present

## 2018-12-15 DIAGNOSIS — F10239 Alcohol dependence with withdrawal, unspecified: Secondary | ICD-10-CM | POA: Diagnosis not present

## 2018-12-15 DIAGNOSIS — F1420 Cocaine dependence, uncomplicated: Secondary | ICD-10-CM | POA: Diagnosis not present

## 2018-12-16 DIAGNOSIS — F1020 Alcohol dependence, uncomplicated: Secondary | ICD-10-CM | POA: Diagnosis not present

## 2018-12-16 DIAGNOSIS — F10239 Alcohol dependence with withdrawal, unspecified: Secondary | ICD-10-CM | POA: Diagnosis not present

## 2018-12-16 DIAGNOSIS — F1420 Cocaine dependence, uncomplicated: Secondary | ICD-10-CM | POA: Diagnosis not present

## 2018-12-17 DIAGNOSIS — F1420 Cocaine dependence, uncomplicated: Secondary | ICD-10-CM | POA: Diagnosis not present

## 2018-12-17 DIAGNOSIS — F1020 Alcohol dependence, uncomplicated: Secondary | ICD-10-CM | POA: Diagnosis not present

## 2018-12-17 DIAGNOSIS — F10239 Alcohol dependence with withdrawal, unspecified: Secondary | ICD-10-CM | POA: Diagnosis not present

## 2018-12-18 DIAGNOSIS — F1420 Cocaine dependence, uncomplicated: Secondary | ICD-10-CM | POA: Diagnosis not present

## 2018-12-18 DIAGNOSIS — F10239 Alcohol dependence with withdrawal, unspecified: Secondary | ICD-10-CM | POA: Diagnosis not present

## 2018-12-18 DIAGNOSIS — F1020 Alcohol dependence, uncomplicated: Secondary | ICD-10-CM | POA: Diagnosis not present

## 2018-12-19 DIAGNOSIS — F10239 Alcohol dependence with withdrawal, unspecified: Secondary | ICD-10-CM | POA: Diagnosis not present

## 2018-12-19 DIAGNOSIS — F1420 Cocaine dependence, uncomplicated: Secondary | ICD-10-CM | POA: Diagnosis not present

## 2018-12-19 DIAGNOSIS — F1020 Alcohol dependence, uncomplicated: Secondary | ICD-10-CM | POA: Diagnosis not present

## 2018-12-20 DIAGNOSIS — F1420 Cocaine dependence, uncomplicated: Secondary | ICD-10-CM | POA: Diagnosis not present

## 2018-12-20 DIAGNOSIS — F10239 Alcohol dependence with withdrawal, unspecified: Secondary | ICD-10-CM | POA: Diagnosis not present

## 2018-12-20 DIAGNOSIS — F1020 Alcohol dependence, uncomplicated: Secondary | ICD-10-CM | POA: Diagnosis not present

## 2018-12-21 DIAGNOSIS — F1020 Alcohol dependence, uncomplicated: Secondary | ICD-10-CM | POA: Diagnosis not present

## 2018-12-21 DIAGNOSIS — F1420 Cocaine dependence, uncomplicated: Secondary | ICD-10-CM | POA: Diagnosis not present

## 2018-12-21 DIAGNOSIS — F10239 Alcohol dependence with withdrawal, unspecified: Secondary | ICD-10-CM | POA: Diagnosis not present

## 2018-12-22 DIAGNOSIS — F1020 Alcohol dependence, uncomplicated: Secondary | ICD-10-CM | POA: Diagnosis not present

## 2018-12-22 DIAGNOSIS — F10239 Alcohol dependence with withdrawal, unspecified: Secondary | ICD-10-CM | POA: Diagnosis not present

## 2018-12-22 DIAGNOSIS — F1420 Cocaine dependence, uncomplicated: Secondary | ICD-10-CM | POA: Diagnosis not present

## 2018-12-23 DIAGNOSIS — F10239 Alcohol dependence with withdrawal, unspecified: Secondary | ICD-10-CM | POA: Diagnosis not present

## 2018-12-23 DIAGNOSIS — F1020 Alcohol dependence, uncomplicated: Secondary | ICD-10-CM | POA: Diagnosis not present

## 2018-12-23 DIAGNOSIS — F1420 Cocaine dependence, uncomplicated: Secondary | ICD-10-CM | POA: Diagnosis not present

## 2018-12-24 DIAGNOSIS — F1420 Cocaine dependence, uncomplicated: Secondary | ICD-10-CM | POA: Diagnosis not present

## 2018-12-24 DIAGNOSIS — F10239 Alcohol dependence with withdrawal, unspecified: Secondary | ICD-10-CM | POA: Diagnosis not present

## 2018-12-24 DIAGNOSIS — F1020 Alcohol dependence, uncomplicated: Secondary | ICD-10-CM | POA: Diagnosis not present

## 2018-12-28 DIAGNOSIS — F1120 Opioid dependence, uncomplicated: Secondary | ICD-10-CM | POA: Diagnosis not present

## 2018-12-28 DIAGNOSIS — F1010 Alcohol abuse, uncomplicated: Secondary | ICD-10-CM | POA: Diagnosis not present

## 2018-12-28 DIAGNOSIS — F419 Anxiety disorder, unspecified: Secondary | ICD-10-CM | POA: Diagnosis not present

## 2018-12-28 DIAGNOSIS — I10 Essential (primary) hypertension: Secondary | ICD-10-CM | POA: Diagnosis not present

## 2019-01-09 DIAGNOSIS — F1010 Alcohol abuse, uncomplicated: Secondary | ICD-10-CM | POA: Diagnosis not present

## 2019-01-17 DIAGNOSIS — F1120 Opioid dependence, uncomplicated: Secondary | ICD-10-CM | POA: Diagnosis not present

## 2019-01-17 DIAGNOSIS — J301 Allergic rhinitis due to pollen: Secondary | ICD-10-CM | POA: Diagnosis not present

## 2019-01-17 DIAGNOSIS — F1010 Alcohol abuse, uncomplicated: Secondary | ICD-10-CM | POA: Diagnosis not present

## 2019-01-17 DIAGNOSIS — J4540 Moderate persistent asthma, uncomplicated: Secondary | ICD-10-CM | POA: Diagnosis not present

## 2019-01-17 DIAGNOSIS — F419 Anxiety disorder, unspecified: Secondary | ICD-10-CM | POA: Diagnosis not present

## 2019-01-17 DIAGNOSIS — I10 Essential (primary) hypertension: Secondary | ICD-10-CM | POA: Diagnosis not present

## 2019-01-18 DIAGNOSIS — F1010 Alcohol abuse, uncomplicated: Secondary | ICD-10-CM | POA: Diagnosis not present

## 2019-01-23 DIAGNOSIS — F1010 Alcohol abuse, uncomplicated: Secondary | ICD-10-CM | POA: Diagnosis not present

## 2019-02-14 DIAGNOSIS — F1010 Alcohol abuse, uncomplicated: Secondary | ICD-10-CM | POA: Diagnosis not present

## 2019-02-28 DIAGNOSIS — F1410 Cocaine abuse, uncomplicated: Secondary | ICD-10-CM | POA: Diagnosis not present

## 2019-02-28 DIAGNOSIS — F1020 Alcohol dependence, uncomplicated: Secondary | ICD-10-CM | POA: Diagnosis not present

## 2019-03-09 DIAGNOSIS — G9529 Other cord compression: Secondary | ICD-10-CM | POA: Diagnosis not present

## 2019-03-09 DIAGNOSIS — G8929 Other chronic pain: Secondary | ICD-10-CM | POA: Diagnosis not present

## 2019-04-03 DIAGNOSIS — F1020 Alcohol dependence, uncomplicated: Secondary | ICD-10-CM | POA: Diagnosis not present

## 2019-04-03 DIAGNOSIS — F1410 Cocaine abuse, uncomplicated: Secondary | ICD-10-CM | POA: Diagnosis not present

## 2019-04-14 DIAGNOSIS — F1020 Alcohol dependence, uncomplicated: Secondary | ICD-10-CM | POA: Diagnosis not present

## 2019-04-14 DIAGNOSIS — F1410 Cocaine abuse, uncomplicated: Secondary | ICD-10-CM | POA: Diagnosis not present

## 2019-04-21 DIAGNOSIS — F1020 Alcohol dependence, uncomplicated: Secondary | ICD-10-CM | POA: Diagnosis not present

## 2019-04-21 DIAGNOSIS — F1410 Cocaine abuse, uncomplicated: Secondary | ICD-10-CM | POA: Diagnosis not present

## 2020-08-22 DEATH — deceased
